# Patient Record
Sex: Female | Born: 1985 | Race: Black or African American | Hispanic: No | Marital: Single | Smoking: Never smoker
Health system: Southern US, Community
[De-identification: ages and names within clinical notes are randomized; demographics above are authoritative.]

## PROBLEM LIST (undated history)

## (undated) ENCOUNTER — Emergency Department (HOSPITAL_COMMUNITY): Admission: EM | Discharge status: Absent without leave | Payer: No Typology Code available for payment source

## (undated) ENCOUNTER — Emergency Department (HOSPITAL_COMMUNITY): Admission: EM | Payer: BC Managed Care – PPO | Source: Home / Self Care

## (undated) ENCOUNTER — Inpatient Hospital Stay (HOSPITAL_COMMUNITY): Payer: Self-pay

## (undated) ENCOUNTER — Inpatient Hospital Stay (HOSPITAL_COMMUNITY): Payer: Medicaid Other | Admitting: Obstetrics and Gynecology

## (undated) DIAGNOSIS — B999 Unspecified infectious disease: Secondary | ICD-10-CM

## (undated) DIAGNOSIS — Z789 Other specified health status: Secondary | ICD-10-CM

## (undated) DIAGNOSIS — R51 Headache: Secondary | ICD-10-CM

## (undated) HISTORY — PX: NO PAST SURGERIES: SHX2092

## (undated) HISTORY — DX: Other specified health status: Z78.9

---
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
## 2004-07-09 ENCOUNTER — Emergency Department (HOSPITAL_COMMUNITY): Admission: EM | Admit: 2004-07-09 | Discharge: 2004-07-09 | Payer: Self-pay | Admitting: Emergency Medicine

## 2004-12-16 ENCOUNTER — Emergency Department (HOSPITAL_COMMUNITY): Admission: EM | Admit: 2004-12-16 | Discharge: 2004-12-16 | Payer: Self-pay | Admitting: *Deleted

## 2005-12-18 ENCOUNTER — Emergency Department (HOSPITAL_COMMUNITY): Admission: EM | Admit: 2005-12-18 | Discharge: 2005-12-19 | Payer: Self-pay | Admitting: Emergency Medicine

## 2008-08-12 ENCOUNTER — Emergency Department (HOSPITAL_COMMUNITY): Admission: EM | Admit: 2008-08-12 | Discharge: 2008-08-12 | Payer: Self-pay | Admitting: Emergency Medicine

## 2008-08-16 ENCOUNTER — Encounter: Admission: RE | Admit: 2008-08-16 | Discharge: 2008-08-16 | Payer: Self-pay | Admitting: Internal Medicine

## 2008-09-23 ENCOUNTER — Emergency Department (HOSPITAL_COMMUNITY): Admission: EM | Admit: 2008-09-23 | Discharge: 2008-09-23 | Payer: Self-pay | Admitting: Emergency Medicine

## 2008-10-26 ENCOUNTER — Emergency Department (HOSPITAL_COMMUNITY): Admission: EM | Admit: 2008-10-26 | Discharge: 2008-10-26 | Payer: Self-pay | Admitting: Emergency Medicine

## 2009-09-19 ENCOUNTER — Emergency Department (HOSPITAL_COMMUNITY): Admission: EM | Admit: 2009-09-19 | Discharge: 2009-09-19 | Payer: Self-pay | Admitting: Emergency Medicine

## 2010-05-14 ENCOUNTER — Emergency Department (HOSPITAL_COMMUNITY)
Admission: EM | Admit: 2010-05-14 | Discharge: 2010-05-14 | Disposition: A | Discharge status: Home / Self Care | Payer: No Typology Code available for payment source | Attending: Emergency Medicine | Admitting: Emergency Medicine

## 2010-05-14 ENCOUNTER — Emergency Department (HOSPITAL_COMMUNITY): Payer: No Typology Code available for payment source

## 2010-05-14 DIAGNOSIS — M542 Cervicalgia: Secondary | ICD-10-CM | POA: Insufficient documentation

## 2010-05-14 DIAGNOSIS — T1490XA Injury, unspecified, initial encounter: Secondary | ICD-10-CM | POA: Insufficient documentation

## 2010-05-14 DIAGNOSIS — Y92410 Unspecified street and highway as the place of occurrence of the external cause: Secondary | ICD-10-CM | POA: Insufficient documentation

## 2010-05-17 ENCOUNTER — Emergency Department (HOSPITAL_COMMUNITY)
Admission: EM | Admit: 2010-05-17 | Discharge: 2010-05-18 | Disposition: A | Discharge status: Home / Self Care | Payer: No Typology Code available for payment source | Attending: Emergency Medicine | Admitting: Emergency Medicine

## 2010-05-17 DIAGNOSIS — M5380 Other specified dorsopathies, site unspecified: Secondary | ICD-10-CM | POA: Insufficient documentation

## 2010-05-17 DIAGNOSIS — M542 Cervicalgia: Secondary | ICD-10-CM | POA: Insufficient documentation

## 2010-05-30 LAB — GC/CHLAMYDIA PROBE AMP, GENITAL
Chlamydia, DNA Probe: NEGATIVE
GC Probe Amp, Genital: NEGATIVE

## 2010-05-30 LAB — URINALYSIS, ROUTINE W REFLEX MICROSCOPIC
Bilirubin Urine: NEGATIVE
Hgb urine dipstick: NEGATIVE
Protein, ur: NEGATIVE mg/dL
Urobilinogen, UA: 1 mg/dL (ref 0.0–1.0)

## 2010-05-30 LAB — URINE CULTURE

## 2010-05-30 LAB — POCT PREGNANCY, URINE: Preg Test, Ur: NEGATIVE

## 2010-05-30 LAB — WET PREP, GENITAL: Yeast Wet Prep HPF POC: NONE SEEN

## 2010-05-31 LAB — URINALYSIS, ROUTINE W REFLEX MICROSCOPIC
Bilirubin Urine: NEGATIVE
Hgb urine dipstick: NEGATIVE
Ketones, ur: NEGATIVE mg/dL
Nitrite: NEGATIVE
pH: 7 (ref 5.0–8.0)

## 2010-05-31 LAB — URINE CULTURE

## 2010-05-31 LAB — URINE MICROSCOPIC-ADD ON

## 2010-05-31 LAB — CBC
MCHC: 33.2 g/dL (ref 30.0–36.0)
MCV: 79.9 fL (ref 78.0–100.0)
Platelets: 263 10*3/uL (ref 150–400)

## 2010-05-31 LAB — DIFFERENTIAL
Basophils Absolute: 0.1 10*3/uL (ref 0.0–0.1)
Eosinophils Relative: 1 % (ref 0–5)
Lymphs Abs: 2 10*3/uL (ref 0.7–4.0)
Monocytes Absolute: 0.4 10*3/uL (ref 0.1–1.0)
Neutro Abs: 3.9 10*3/uL (ref 1.7–7.7)

## 2010-05-31 LAB — POCT PREGNANCY, URINE: Preg Test, Ur: NEGATIVE

## 2010-05-31 LAB — BASIC METABOLIC PANEL
BUN: 6 mg/dL (ref 6–23)
CO2: 26 mEq/L (ref 19–32)
Chloride: 110 mEq/L (ref 96–112)
Creatinine, Ser: 0.65 mg/dL (ref 0.4–1.2)

## 2011-02-22 ENCOUNTER — Emergency Department (HOSPITAL_COMMUNITY)
Admission: EM | Admit: 2011-02-22 | Discharge: 2011-02-22 | Disposition: A | Discharge status: Home / Self Care | Payer: Self-pay | Attending: Emergency Medicine | Admitting: Emergency Medicine

## 2011-02-22 DIAGNOSIS — N760 Acute vaginitis: Secondary | ICD-10-CM | POA: Insufficient documentation

## 2011-02-22 DIAGNOSIS — A499 Bacterial infection, unspecified: Secondary | ICD-10-CM | POA: Insufficient documentation

## 2011-02-22 DIAGNOSIS — R109 Unspecified abdominal pain: Secondary | ICD-10-CM | POA: Insufficient documentation

## 2011-02-22 DIAGNOSIS — N390 Urinary tract infection, site not specified: Secondary | ICD-10-CM | POA: Insufficient documentation

## 2011-02-22 DIAGNOSIS — B9689 Other specified bacterial agents as the cause of diseases classified elsewhere: Secondary | ICD-10-CM | POA: Insufficient documentation

## 2011-02-22 DIAGNOSIS — R350 Frequency of micturition: Secondary | ICD-10-CM | POA: Insufficient documentation

## 2011-02-22 DIAGNOSIS — N949 Unspecified condition associated with female genital organs and menstrual cycle: Secondary | ICD-10-CM | POA: Insufficient documentation

## 2011-02-22 LAB — URINALYSIS, ROUTINE W REFLEX MICROSCOPIC
Bilirubin Urine: NEGATIVE
Glucose, UA: NEGATIVE mg/dL
Ketones, ur: NEGATIVE mg/dL
Nitrite: POSITIVE — AB
Specific Gravity, Urine: 1.025 (ref 1.005–1.030)
pH: 6 (ref 5.0–8.0)

## 2011-02-22 LAB — WET PREP, GENITAL
Trich, Wet Prep: NONE SEEN
Yeast Wet Prep HPF POC: NONE SEEN

## 2011-02-22 MED ORDER — CEPHALEXIN 250 MG/5ML PO SUSR: 250.0000 mg | Freq: Four times a day (QID) | ORAL | Status: AC

## 2011-02-22 MED ORDER — METRONIDAZOLE 500 MG PO TABS: 500.0000 mg | ORAL_TABLET | Freq: Two times a day (BID) | ORAL | Status: AC

## 2011-02-22 MED ORDER — CEPHALEXIN 250 MG PO CAPS
500.0000 mg | ORAL_CAPSULE | Freq: Once | ORAL | Status: AC
  Administered 2011-02-22: 500 mg via ORAL
  Filled 2011-02-22: qty 2

## 2011-02-22 NOTE — ED Notes (Signed)
[place], PA at bedside 

## 2011-02-22 NOTE — ED Provider Notes (Signed)
Medical screening examination/treatment/procedure(s) were performed by non-physician practitioner and as supervising physician I was immediately available for consultation/collaboration.  Raeford Razor, MD 02/22/11 [phone]

## 2011-02-22 NOTE — ED Notes (Signed)
Patient currently resting quietly in bed; no respiratory or acute distress noted.  Patient updated on plan of care; informed patient that we are currently waiting for the PA to come and talk about test results.  Patient has no other questions or concerns at this time; will continue to monitor.

## 2011-02-22 NOTE — ED Notes (Signed)
Received bedside report from [place], [place].  Patient currently sitting up in bed; no respiratory or acute distress noted.  Patient updated on plan of care; informed patient that she will be moved to a private room for a pelvic exam.  Patient has no other questions or concerns at this time.  Updated patient on plan of care; will continue to monitor.

## 2011-02-22 NOTE — ED Notes (Signed)
Pt reports that for approximately a month she has had urine with foul odor and pelvic pain.  Pt reports that she went to health dept and they said she was fine.  Pt states "I thought I just needed to drink more water, but it hasn't helped".

## 2011-02-22 NOTE — ED Notes (Signed)
Patient given copy of discharge paperwork; went over discharge instructions with patient.  Instructed patient to take antibiotics as prescribed and to finish entire prescription of antibiotics.  Instructed patient to follow up with [facility] as needed for worsening of symptoms.  Patient instructed to return to the ED for new, worsening, or concerning symptoms.

## 2011-02-22 NOTE — ED Notes (Signed)
Pt currently resting in bed. No respiratory or cardiac distress. Pt complaining of pelvic pain. Will continue to monitor.

## 2011-02-22 NOTE — ED Provider Notes (Signed)
History    this is a generally healthy [id number] 25 year old female presenting to the ED with chief complaints of odor in urine, and pelvic discomfort. The patient states for the past 2-3 months she has been having abnormal smells of urine and abnormal color to the urine.  She has been seen and evaluated at a healthcare clinic 2 months ago with no specific findings. Patient states her symptom has not improved. She complains of low abdominal pain while lying down.  Pain is intermittent. She has increased urinary frequency, without burning, pain. She has noticed intermittent bouts of white vaginal discharge.  She denies pain with sexual intercourse. She denies fever, rash. Her last menstruation was 2 days ago. She denies prior history of STD. She has a 1 sexual partner and uses protection. She denies a significant history of douching  CSN: [id number]  Arrival date & time 02/22/11  1525   First MD Initiated Contact with Patient 02/22/11 1820      Chief Complaint  Patient presents with  . Pelvic Pain    (Consider location/radiation/quality/duration/timing/severity/associated sxs/prior treatment) HPI  History reviewed. No pertinent past medical history.  History reviewed. No pertinent past surgical history.  No family history on file.  History  Substance Use Topics  . Smoking status: Never Smoker   . Smokeless tobacco: Not on file  . Alcohol Use: No    OB History    Grav Para Term Preterm Abortions TAB SAB Ect Mult Living                  Review of Systems  All other systems reviewed and are negative.    Allergies  Review of patient's allergies indicates no known allergies.  Home Medications  No current outpatient prescriptions on file.  BP 114/68  Pulse 84  Temp(Src) 98 F (36.7 C) (Oral)  Resp 20  Ht 4\' 11"  (1.499 m)  Wt 112 lb (50.803 kg)  BMI 22.62 kg/m2  SpO2 100%  LMP 02/19/2011  Physical Exam  Nursing note and vitals reviewed. Constitutional: She appears  well-developed and well-nourished. No distress.  HENT:  Head: Normocephalic and atraumatic.  Eyes: Conjunctivae are normal.  Neck: Normal range of motion. Neck supple.  Cardiovascular: Normal rate and regular rhythm.   Pulmonary/Chest: Effort normal and breath sounds normal. She exhibits no tenderness.  Abdominal: Soft. There is no tenderness.  Genitourinary: Vagina normal and uterus normal. There is no rash or lesion on the right labia. There is no rash or lesion on the left labia. Cervix exhibits no motion tenderness and no discharge. Right adnexum displays no mass and no tenderness. Left adnexum displays no mass and no tenderness. No erythema, tenderness or bleeding around the vagina. No vaginal discharge found.  Lymphadenopathy:       Right: No inguinal adenopathy present.       Left: No inguinal adenopathy present.    ED Course  Procedures (including critical care time)  Labs Reviewed - No data to display No results found.   No diagnosis found.  Results for orders placed during the hospital encounter of 02/22/11  URINALYSIS, ROUTINE W REFLEX MICROSCOPIC      Component Value Range   Color, Urine YELLOW  YELLOW    APPearance CLOUDY (*) CLEAR    Specific Gravity, Urine 1.025  1.005 - 1.030    pH 6.0  5.0 - 8.0    Glucose, UA NEGATIVE  NEGATIVE (mg/dL)   Hgb urine dipstick SMALL (*) NEGATIVE    Bilirubin  Urine NEGATIVE  NEGATIVE    Ketones, ur NEGATIVE  NEGATIVE (mg/dL)   Protein, ur NEGATIVE  NEGATIVE (mg/dL)   Urobilinogen, UA 1.0  0.0 - 1.0 (mg/dL)   Nitrite POSITIVE (*) NEGATIVE    Leukocytes, UA TRACE (*) NEGATIVE   PREGNANCY, URINE      Component Value Range   Preg Test, Ur NEGATIVE    WET PREP, GENITAL      Component Value Range   Yeast, Wet Prep NONE SEEN  NONE SEEN    Trich, Wet Prep NONE SEEN  NONE SEEN    Clue Cells, Wet Prep MODERATE (*) NONE SEEN    WBC, Wet Prep HPF POC FEW (*) NONE SEEN   URINE MICROSCOPIC-ADD ON      Component Value Range   Squamous  Epithelial / LPF MANY (*) RARE    WBC, UA 3-6  <3 (WBC/hpf)   RBC / HPF 0-2  <3 (RBC/hpf)   Bacteria, UA MANY (*) RARE    Urine-Other MUCOUS PRESENT     No results found.    MDM  Patient presents with symptoms consistent with urinary tract infection. That examination is unremarkable. I doubt PID or cervicitis. Her urine shows evidence of urinary tract infection.  The wet prep shows evidence of bacterial vaginosis. I would treat her with Keflex and Flagyl. Cultures were sent.  Pt is in no acute distress, vital sign stable.          Fayrene Helper, [place] 02/22/11 2041

## 2011-02-23 LAB — GC/CHLAMYDIA PROBE AMP, GENITAL
Chlamydia, DNA Probe: NEGATIVE
GC Probe Amp, Genital: NEGATIVE

## 2011-02-24 LAB — URINE CULTURE
Colony Count: >=100000
Culture  Setup Time: 201212310138

## 2011-02-25 NOTE — ED Notes (Signed)
+   Urine Patient treated with keflex-sensitive to same-chart appended per protocol MD. 

## 2011-03-07 ENCOUNTER — Encounter (HOSPITAL_COMMUNITY): Payer: Self-pay

## 2011-03-07 ENCOUNTER — Emergency Department (HOSPITAL_COMMUNITY): Payer: Self-pay

## 2011-03-07 ENCOUNTER — Emergency Department (HOSPITAL_COMMUNITY)
Admission: EM | Admit: 2011-03-07 | Discharge: 2011-03-07 | Disposition: A | Discharge status: Home / Self Care | Payer: Self-pay | Attending: Emergency Medicine | Admitting: Emergency Medicine

## 2011-03-07 DIAGNOSIS — S6992XA Unspecified injury of left wrist, hand and finger(s), initial encounter: Secondary | ICD-10-CM

## 2011-03-07 DIAGNOSIS — W230XXA Caught, crushed, jammed, or pinched between moving objects, initial encounter: Secondary | ICD-10-CM | POA: Insufficient documentation

## 2011-03-07 DIAGNOSIS — S6010XA Contusion of unspecified finger with damage to nail, initial encounter: Secondary | ICD-10-CM

## 2011-03-07 DIAGNOSIS — S6000XA Contusion of unspecified finger without damage to nail, initial encounter: Secondary | ICD-10-CM | POA: Insufficient documentation

## 2011-03-07 DIAGNOSIS — S6980XA Other specified injuries of unspecified wrist, hand and finger(s), initial encounter: Secondary | ICD-10-CM | POA: Insufficient documentation

## 2011-03-07 DIAGNOSIS — M79609 Pain in unspecified limb: Secondary | ICD-10-CM | POA: Insufficient documentation

## 2011-03-07 DIAGNOSIS — M7989 Other specified soft tissue disorders: Secondary | ICD-10-CM | POA: Insufficient documentation

## 2011-03-07 DIAGNOSIS — S6990XA Unspecified injury of unspecified wrist, hand and finger(s), initial encounter: Secondary | ICD-10-CM | POA: Insufficient documentation

## 2011-03-07 MED ORDER — TRAMADOL HCL 50 MG PO TABS: 50.0000 mg | ORAL_TABLET | Freq: Four times a day (QID) | ORAL | Status: DC | PRN

## 2011-03-07 MED ORDER — TRAMADOL HCL 50 MG PO TABS: 50.0000 mg | ORAL_TABLET | Freq: Four times a day (QID) | ORAL | Status: AC | PRN

## 2011-03-07 NOTE — ED Notes (Signed)
Pt states slammed left thumb in car door last night pt states pain has worsened presents with swelling and bruising to the area states pain radiates to the hand

## 2011-03-07 NOTE — ED Provider Notes (Signed)
History     CSN: [id number]  Arrival date & time 03/07/11  1801   First MD Initiated Contact with Patient 03/07/11 1915      Chief Complaint  Patient presents with  . Hand Injury    left thumb injury    (Consider location/radiation/quality/duration/timing/severity/associated sxs/prior treatment) The history is provided by the patient.  RHD. Left thumb pain with radiation to hand/wrist after closing thumb in car door last night. Pain throbbing, severe, constant, unchanged. There is assoc swelling and bruising to nailbed. No assoc abrasion, laceration, numbness, weakness. No prior tx.  History reviewed. No pertinent past medical history.  History reviewed. No pertinent past surgical history.  No family history on file.  History  Substance Use Topics  . Smoking status: Never Smoker   . Smokeless tobacco: Not on file  . Alcohol Use: No     Review of Systems  Constitutional: Negative for fever and chills.  Musculoskeletal: Positive for joint swelling.       Positive left thumb pain  Skin: Negative for wound.       Left thumb nailbed color change  Neurological: Negative for weakness and numbness.    Allergies  Review of patient's allergies indicates no known allergies.  Home Medications  No current outpatient prescriptions on file.  BP 113/63  Pulse 96  Temp(Src) 98.6 F (37 C) (Oral)  Resp 18  SpO2 100%  LMP 02/19/2011  Physical Exam  Nursing note and vitals reviewed. Constitutional: She is oriented to person, place, and time. She appears well-developed and well-nourished. No distress.  HENT:  Head: Normocephalic and atraumatic.  Right Ear: External ear normal.  Left Ear: External ear normal.  Eyes: Pupils are equal, round, and reactive to light.  Neck: Normal range of motion. Neck supple.  Cardiovascular: Normal rate and regular rhythm.   Pulmonary/Chest: Effort normal. No respiratory distress.  Abdominal: Soft. She exhibits no distension. There is no  tenderness.  Musculoskeletal:       Left hand: She exhibits decreased range of motion and tenderness. She exhibits normal two-point discrimination, normal capillary refill, no deformity and no laceration. normal sensation noted. She exhibits no thumb/finger opposition.       Hands: Neurological: She is alert and oriented to person, place, and time.       Sensation intact to light touch. Intact 2-point discrimination.  Skin: Skin is warm and dry.       Subungual hematoma to proximal left thumb nailbed. Good distal capillary refill. No abrasion or laceration    ED Course  INCISION AND DRAINAGE Date/Time: 03/07/2011 8:20 PM Performed by: Lorenz Coaster JUSTICE Authorized by: Lorenz Coaster JUSTICE Consent: Verbal consent obtained. Risks and benefits: risks, benefits and alternatives were discussed Consent given by: patient Patient understanding: patient states understanding of the procedure being performed Patient identity confirmed: verbally with patient Time out: Immediately prior to procedure a "time out" was called to verify the correct patient, procedure, equipment, support staff and site/side marked as required. Type: subungual hematoma Location: left thumb. Anesthesia method: none. Patient sedated: no Patient tolerance: Patient tolerated the procedure well with no immediate complications. Comments: Bovie pen used to create incision in nailbed with slow oozing return of dark blood, immediate relief of pressure expressed by patient   (including critical care time)  Labs Reviewed - No data to display Dg Finger Thumb Left  03/07/2011  *RADIOLOGY REPORT*  Clinical Data: 26 year old female with blunt trauma.  LEFT THUMB 2+V  Comparison: Left hand series 08/16/2008.  Findings: Bone mineralization is within normal limits.  Osseous structures in the left first ray appear intact.  Joint spaces are preserved.  No acute fracture identified.  IMPRESSION: No acute fracture or dislocation  identified about the left thumb.  Original Report Authenticated By: Harley Hallmark, M.D.     Dx 1: Left thumb injury Dx 2: Subungual hematoma   MDM  Thumb trauma. X-ray with no evidence of fracture or dislocation. Improved pain after subungual hematoma drainage, increased thumb IP flex/ext after drainage with distal n/v intact.    Medical screening examination/treatment/procedure(s) were performed by non-physician practitioner and as supervising physician I was immediately available for consultation/collaboration. Osvaldo Human, M.D.      Elwyn Reach [place], [place] 03/07/11 2051  Carleene Cooper III, MD 03/08/11 [phone]

## 2012-05-17 ENCOUNTER — Emergency Department (HOSPITAL_COMMUNITY)
Admission: EM | Admit: 2012-05-17 | Discharge: 2012-05-18 | Disposition: A | Discharge status: Home / Self Care | Payer: Self-pay | Attending: Emergency Medicine | Admitting: Emergency Medicine

## 2012-05-17 DIAGNOSIS — Z3490 Encounter for supervision of normal pregnancy, unspecified, unspecified trimester: Secondary | ICD-10-CM

## 2012-05-17 DIAGNOSIS — O9989 Other specified diseases and conditions complicating pregnancy, childbirth and the puerperium: Secondary | ICD-10-CM | POA: Insufficient documentation

## 2012-05-17 DIAGNOSIS — R110 Nausea: Secondary | ICD-10-CM | POA: Insufficient documentation

## 2012-05-17 DIAGNOSIS — R109 Unspecified abdominal pain: Secondary | ICD-10-CM | POA: Insufficient documentation

## 2012-05-18 ENCOUNTER — Encounter (HOSPITAL_COMMUNITY): Payer: Self-pay | Admitting: *Deleted

## 2012-05-18 ENCOUNTER — Emergency Department (HOSPITAL_COMMUNITY): Payer: Self-pay

## 2012-05-18 LAB — WET PREP, GENITAL
Trich, Wet Prep: NONE SEEN
Yeast Wet Prep HPF POC: NONE SEEN

## 2012-05-18 LAB — CBC WITH DIFFERENTIAL/PLATELET
Basophils Relative: 0 % (ref 0–1)
Hemoglobin: 11.1 g/dL — ABNORMAL LOW (ref 12.0–15.0)
Lymphocytes Relative: 24 % (ref 12–46)
Lymphs Abs: 2.1 10*3/uL (ref 0.7–4.0)
MCHC: 33.1 g/dL (ref 30.0–36.0)
Monocytes Relative: 5 % (ref 3–12)
Neutro Abs: 5.9 10*3/uL (ref 1.7–7.7)
Neutrophils Relative %: 70 % (ref 43–77)
RBC: 4.33 MIL/uL (ref 3.87–5.11)
WBC: 8.5 10*3/uL (ref 4.0–10.5)

## 2012-05-18 LAB — COMPREHENSIVE METABOLIC PANEL
Albumin: 3.3 g/dL — ABNORMAL LOW (ref 3.5–5.2)
Alkaline Phosphatase: 39 U/L (ref 39–117)
BUN: 5 mg/dL — ABNORMAL LOW (ref 6–23)
CO2: 20 mEq/L (ref 19–32)
Chloride: 102 mEq/L (ref 96–112)
Potassium: 3.4 mEq/L — ABNORMAL LOW (ref 3.5–5.1)
Total Bilirubin: 0.1 mg/dL — ABNORMAL LOW (ref 0.3–1.2)

## 2012-05-18 LAB — URINALYSIS, ROUTINE W REFLEX MICROSCOPIC
Bilirubin Urine: NEGATIVE
Glucose, UA: NEGATIVE mg/dL
Ketones, ur: NEGATIVE mg/dL
Nitrite: NEGATIVE
pH: 5.5 (ref 5.0–8.0)

## 2012-05-18 LAB — LIPASE, BLOOD: Lipase: 29 U/L (ref 11–59)

## 2012-05-18 LAB — GC/CHLAMYDIA PROBE AMP: CT Probe RNA: NEGATIVE

## 2012-05-18 MED ORDER — PRENATAL COMPLETE 14-0.4 MG PO TABS: 1.0000 | ORAL_TABLET | Freq: Every day | ORAL | Status: DC

## 2012-05-18 MED ORDER — GI COCKTAIL ~~LOC~~
30.0000 mL | Freq: Once | ORAL | Status: AC
  Administered 2012-05-18: 30 mL via ORAL
  Filled 2012-05-18: qty 30

## 2012-05-18 MED ORDER — ONDANSETRON 4 MG PO TBDP
4.0000 mg | ORAL_TABLET | Freq: Once | ORAL | Status: AC
  Administered 2012-05-18: 4 mg via ORAL
  Filled 2012-05-18: qty 1

## 2012-05-18 NOTE — ED Provider Notes (Signed)
History     CSN: [id number]  Arrival date & time 05/17/12  2350   First MD Initiated Contact with Patient 05/18/12 0007      Chief Complaint  Patient presents with  . Abdominal Pain    (Consider location/radiation/quality/duration/timing/severity/associated sxs/prior treatment) HPI Comments: Patient presents with intermittent upper abdominal pain and burning for the past month. Is associated with nausea but no vomiting. Patient states her symptoms are worse tonight. Food sometimes make it better and Sometimes makes it worse. Denies any alcohol or excessive NSAID use. No history of ulcers. She states no change in her bowel habits and normally goes every other day. No dysuria or hematuria. No vaginal bleeding or discharge. She does not know when her last menstrual period was. Denies any back pain, chest pain, shortness of breath, headache or fever.  The history is provided by the patient.    History reviewed. No pertinent past medical history.  History reviewed. No pertinent past surgical history.  History reviewed. No pertinent family history.  History  Substance Use Topics  . Smoking status: Never Smoker   . Smokeless tobacco: Not on file  . Alcohol Use: No    OB History   Grav Para Term Preterm Abortions TAB SAB Ect Mult Living                  Review of Systems  Constitutional: Negative for activity change and appetite change.  HENT: Negative for congestion and rhinorrhea.   Respiratory: Negative for cough, chest tightness and shortness of breath.   Cardiovascular: Negative for chest pain.  Gastrointestinal: Positive for nausea and abdominal pain. Negative for vomiting and constipation.  Genitourinary: Negative for dysuria, vaginal bleeding and vaginal discharge.  Musculoskeletal: Negative for back pain.  Skin: Negative for rash.  Neurological: Negative for dizziness, weakness and headaches.  A complete 10 system review of systems was obtained and all systems are  negative except as noted in the HPI and PMH.    Allergies  Review of patient's allergies indicates no known allergies.  Home Medications   Current Outpatient Rx  Name  Route  Sig  Dispense  Refill  . Prenatal Vit-Fe Fumarate-FA (PRENATAL COMPLETE) 14-0.4 MG TABS   Oral   Take 1 tablet by mouth daily.   60 each   0     BP 117/72  Pulse 91  Temp(Src) 98.5 F (36.9 C) (Oral)  Resp 19  SpO2 100%  Physical Exam  Constitutional: She is oriented to person, place, and time. She appears well-developed and well-nourished. No distress.  HENT:  Head: Normocephalic and atraumatic.  Mouth/Throat: Oropharynx is clear and moist.  Eyes: Conjunctivae and EOM are normal. Pupils are equal, round, and reactive to light.  Neck: Normal range of motion. Neck supple.  Cardiovascular: Normal rate, regular rhythm and normal heart sounds.   No murmur heard. Pulmonary/Chest: Effort normal and breath sounds normal. No respiratory distress.  Abdominal: Soft. She exhibits distension. There is tenderness. There is no rebound and no guarding.  Mild RUQ and epigastric tenderness, no guarding or rebound  Genitourinary: Cervix exhibits no motion tenderness. Right adnexum displays no mass and no tenderness. Left adnexum displays no mass and no tenderness. Vaginal discharge found.  White discharge in vaginal vault  Musculoskeletal: Normal range of motion. She exhibits no edema and no tenderness.  No CVAT  Neurological: She is alert and oriented to person, place, and time. No cranial nerve deficit. She exhibits normal muscle tone. Coordination normal.  Skin:  Skin is warm.    ED Course  Procedures (including critical care time)  Labs Reviewed  WET PREP, GENITAL - Abnormal; Notable for the following:    WBC, Wet Prep HPF POC MANY (*)    All other components within normal limits  CBC WITH DIFFERENTIAL - Abnormal; Notable for the following:    Hemoglobin 11.1 (*)    HCT 33.5 (*)    MCV 77.4 (*)    MCH  25.6 (*)    All other components within normal limits  COMPREHENSIVE METABOLIC PANEL - Abnormal; Notable for the following:    Sodium 133 (*)    Potassium 3.4 (*)    BUN 5 (*)    Albumin 3.3 (*)    Total Bilirubin 0.1 (*)    All other components within normal limits  URINALYSIS, ROUTINE W REFLEX MICROSCOPIC - Abnormal; Notable for the following:    APPearance CLOUDY (*)    All other components within normal limits  PREGNANCY, URINE - Abnormal; Notable for the following:    Preg Test, Ur POSITIVE (*)    All other components within normal limits  HCG, QUANTITATIVE, PREGNANCY - Abnormal; Notable for the following:    hCG, Beta Chain, Quant, S 16109 (*)    All other components within normal limits  GC/CHLAMYDIA PROBE AMP  LIPASE, BLOOD   US Abdomen Complete  05/18/2012  *RADIOLOGY REPORT*  Clinical Data:  Abdominal pain.  COMPLETE ABDOMINAL ULTRASOUND  Comparison:  None.  Findings:  Gallbladder:  No gallstones, gallbladder wall thickening, or pericholecystic fluid.  Common bile duct:  4 mm, normal.  Liver:  No focal lesion identified.  Within normal limits in parenchymal echogenicity.  IVC:  Appears normal.  Pancreas:  Not visualized due to overlying bowel gas.  Spleen:  83 mm.  Normal echotexture.  Right Kidney:  11.5 cm. Normal echotexture.  Normal central sinus echo complex.  No calculi or hydronephrosis.  Left Kidney:  10.5 cm. Normal echotexture.  Normal central sinus echo complex.  No calculi or hydronephrosis.  Abdominal aorta:  1.7 cm.  No aneurysm.  IMPRESSION: Normal abdominal ultrasound.  Gallbladder is not fully distended due to nonfasting state.   Original Report Authenticated By: Andreas Newport, M.D.    US Ob Comp Less 14 Wks  05/18/2012  *RADIOLOGY REPORT*  Clinical Data: Abdominal pain.  OBSTETRIC <14 WK ULTRASOUND  Technique:  Transabdominal ultrasound was performed for evaluation of the gestation as well as the maternal uterus and adnexal regions.  Comparison:  05/18/2012  abdominal ultrasound.  Intrauterine gestational sac: Visualized/normal in shape. Yolk sac: Present Embryo: Present Cardiac Activity: Present Heart Rate: 160 bpm  CRL:  48.9 mm  11 w  5 d            [place] EDC: 12/02/2012  Maternal uterus/Adnexae: Physiologic appearance of the adnexa. No free fluid.  IMPRESSION: Single living IUP.  No acute maternal findings.   Original Report Authenticated By: Andreas Newport, M.D.      1. Intrauterine pregnancy       MDM  Intermittent abdominal pain for the past month associated with nausea. Abdomen soft and nontender without peritoneal signs.  Pregnancy test positive. Urinalysis negative. Labs otherwise unremarkable. Normal LFTs and lipase.  Intrauterine pregnancy confirmed at 11 weeks 5 days gestation. Fetal heart rate 160. Pelvic exam benign without vaginal bleeding, cervix closed.  Prenatal vitamins begun. Patient will followup with women's clinic to establish care.  Glynn Octave, MD 05/18/12 [phone]

## 2012-05-18 NOTE — ED Notes (Signed)
Pt states she doesn't remember where her period was , should have already "been came"

## 2012-05-25 ENCOUNTER — Inpatient Hospital Stay (HOSPITAL_COMMUNITY)
Admission: AD | Admit: 2012-05-25 | Discharge: 2012-05-25 | Disposition: A | Discharge status: Home / Self Care | Payer: Medicaid Other | Source: Ambulatory Visit | Attending: Obstetrics and Gynecology | Admitting: Obstetrics and Gynecology

## 2012-05-25 ENCOUNTER — Encounter (HOSPITAL_COMMUNITY): Payer: Self-pay

## 2012-05-25 DIAGNOSIS — R109 Unspecified abdominal pain: Secondary | ICD-10-CM | POA: Insufficient documentation

## 2012-05-25 DIAGNOSIS — K5900 Constipation, unspecified: Secondary | ICD-10-CM | POA: Insufficient documentation

## 2012-05-25 DIAGNOSIS — O99891 Other specified diseases and conditions complicating pregnancy: Secondary | ICD-10-CM | POA: Insufficient documentation

## 2012-05-25 DIAGNOSIS — O99892 Other specified diseases and conditions complicating childbirth: Secondary | ICD-10-CM

## 2012-05-25 DIAGNOSIS — N949 Unspecified condition associated with female genital organs and menstrual cycle: Secondary | ICD-10-CM | POA: Insufficient documentation

## 2012-05-25 HISTORY — DX: Headache: R51

## 2012-05-25 MED ORDER — CALCIUM POLYCARBOPHIL 625 MG PO TABS: 625.0000 mg | ORAL_TABLET | Freq: Every day | ORAL | Status: DC

## 2012-05-25 MED ORDER — POLYETHYLENE GLYCOL 3350 17 G PO PACK: 17.0000 g | PACK | Freq: Every day | ORAL | Status: DC

## 2012-05-25 NOTE — MAU Provider Note (Signed)
History     CSN: [id number]  Arrival date and time: 05/25/12 1731   None     Chief Complaint  Patient presents with  . Abdominal Cramping   HPI This is a 27 y.o. female at [redacted]w[redacted]d who presents with c/o cramping.  Had it yesterday also. Denies bleeding. Denies discharge today. Does admit to some constipation.  RN Note: Pt states was having cramping yesterday and drank a lot of water and pain went away. Pain began again after working today. Cramping is generalized in lower abdomen. Yellowish brown vaginal discharge went away  OB History   Grav Para Term Preterm Abortions TAB SAB Ect Mult Living   1               Past Medical History  Diagnosis Date  . Headache     Past Surgical History  Procedure Laterality Date  . No past surgeries      Family History  Problem Relation Age of Onset  . Cancer Paternal Aunt   . Cancer Paternal Grandmother     History  Substance Use Topics  . Smoking status: Never Smoker   . Smokeless tobacco: Not on file  . Alcohol Use: No    Allergies: No Known Allergies  Prescriptions prior to admission  Medication Sig Dispense Refill  . Prenatal Vit-Fe Fumarate-FA (PRENATAL COMPLETE) 14-0.4 MG TABS Take 1 tablet by mouth daily.  60 each  0    Review of Systems  Constitutional: Negative for fever, chills and malaise/fatigue.  Gastrointestinal: Positive for abdominal pain (pelvic cramping that comes and goes). Negative for nausea, vomiting, diarrhea and constipation.  Genitourinary: Negative for dysuria.   Physical Exam   Blood pressure 110/64, pulse 96, temperature 98.2 F (36.8 C), temperature source Oral, resp. rate 18, height 4\' 11"  (1.499 m), weight 146 lb 4 oz (66.339 kg).  Physical Exam  Constitutional: She is oriented to person, place, and time. She appears well-developed and well-nourished. No distress.  HENT:  Head: Normocephalic.  Cardiovascular: Normal rate.   Respiratory: Effort normal.  GI: Soft. She exhibits no  distension and no mass. There is no tenderness. There is no rebound and no guarding.  Genitourinary: Vagina normal and uterus normal. No vaginal discharge found.  Cervix long and closed Fetal heart tones 160  Musculoskeletal: Normal range of motion.  Neurological: She is alert and oriented to person, place, and time.  Skin: Skin is warm and dry.  Psychiatric: She has a normal mood and affect.    MAU Course  Procedures  MDM US Abdomen Complete  05/18/2012  *RADIOLOGY REPORT*  Clinical Data:  Abdominal pain.  COMPLETE ABDOMINAL ULTRASOUND  Comparison:  None.  Findings:  Gallbladder:  No gallstones, gallbladder wall thickening, or pericholecystic fluid.  Common bile duct:  4 mm, normal.  Liver:  No focal lesion identified.  Within normal limits in parenchymal echogenicity.  IVC:  Appears normal.  Pancreas:  Not visualized due to overlying bowel gas.  Spleen:  83 mm.  Normal echotexture.  Right Kidney:  11.5 cm. Normal echotexture.  Normal central sinus echo complex.  No calculi or hydronephrosis.  Left Kidney:  10.5 cm. Normal echotexture.  Normal central sinus echo complex.  No calculi or hydronephrosis.  Abdominal aorta:  1.7 cm.  No aneurysm.  IMPRESSION: Normal abdominal ultrasound.  Gallbladder is not fully distended due to nonfasting state.   Original Report Authenticated By: Andreas Newport, M.D.    US Ob Comp Less 14 Wks  05/18/2012  *  RADIOLOGY REPORT*  Clinical Data: Abdominal pain.  OBSTETRIC <14 WK ULTRASOUND  Technique:  Transabdominal ultrasound was performed for evaluation of the gestation as well as the maternal uterus and adnexal regions.  Comparison:  05/18/2012 abdominal ultrasound.  Intrauterine gestational sac: Visualized/normal in shape. Yolk sac: Present Embryo: Present Cardiac Activity: Present Heart Rate: 160 bpm  CRL:  48.9 mm  11 w  5 d            [place] EDC: 12/02/2012  Maternal uterus/Adnexae: Physiologic appearance of the adnexa. No free fluid.    IMPRESSION: Single living  IUP.  No acute maternal findings.     Original Report Authenticated By: Andreas Newport, M.D.     Assessment and Plan  A:  SIUP at [redacted]w[redacted]d       Uterine cramping      Constipation       P:  Discussed hydration and keeping bladder empty.         Reviewed good US findings from 3/26       Reviewed remedies for constipation      Scheduled to come to clinic for new ob visit         [facility] 05/25/2012, 6:26 PM

## 2012-05-25 NOTE — MAU Note (Addendum)
Pt states was having cramping yesterday and drank a lot of water and pain went away. Pain began again after working today. Cramping is generalized in lower abdomen. Yellowish brown vaginal discharge went away

## 2012-05-27 NOTE — MAU Provider Note (Signed)
Attestation of Attending Supervision of Advanced Practitioner: Evaluation and management procedures were performed by the PA/NP/CNM/OB Fellow under my supervision/collaboration. Chart reviewed and agree with management and plan.  Callen Zuba V 05/27/2012 9:10 AM

## 2012-05-30 ENCOUNTER — Emergency Department (HOSPITAL_COMMUNITY)
Admission: EM | Admit: 2012-05-30 | Discharge: 2012-05-31 | Disposition: A | Discharge status: Home / Self Care | Payer: Medicaid Other | Attending: Emergency Medicine | Admitting: Emergency Medicine

## 2012-05-30 ENCOUNTER — Encounter (HOSPITAL_COMMUNITY): Payer: Self-pay | Admitting: *Deleted

## 2012-05-30 DIAGNOSIS — B373 Candidiasis of vulva and vagina: Secondary | ICD-10-CM

## 2012-05-30 DIAGNOSIS — Z3490 Encounter for supervision of normal pregnancy, unspecified, unspecified trimester: Secondary | ICD-10-CM

## 2012-05-30 DIAGNOSIS — R109 Unspecified abdominal pain: Secondary | ICD-10-CM | POA: Insufficient documentation

## 2012-05-30 DIAGNOSIS — O2390 Unspecified genitourinary tract infection in pregnancy, unspecified trimester: Secondary | ICD-10-CM | POA: Insufficient documentation

## 2012-05-30 DIAGNOSIS — B3731 Acute candidiasis of vulva and vagina: Secondary | ICD-10-CM | POA: Insufficient documentation

## 2012-05-30 DIAGNOSIS — Z8679 Personal history of other diseases of the circulatory system: Secondary | ICD-10-CM | POA: Insufficient documentation

## 2012-05-30 NOTE — ED Notes (Signed)
Pt states she has a vaginal discharge , denies bleeding,  Admits to no OB/GYN treatment she is approximately [redacted] weeks pregnant doesn't remember her last period.

## 2012-05-31 ENCOUNTER — Encounter (HOSPITAL_COMMUNITY): Payer: Self-pay | Admitting: *Deleted

## 2012-05-31 ENCOUNTER — Inpatient Hospital Stay (HOSPITAL_COMMUNITY)
Admission: AD | Admit: 2012-05-31 | Discharge: 2012-06-01 | Disposition: A | Discharge status: Home / Self Care | Payer: Medicaid Other | Source: Ambulatory Visit | Attending: Obstetrics and Gynecology | Admitting: Obstetrics and Gynecology

## 2012-05-31 DIAGNOSIS — O26899 Other specified pregnancy related conditions, unspecified trimester: Secondary | ICD-10-CM

## 2012-05-31 DIAGNOSIS — O9989 Other specified diseases and conditions complicating pregnancy, childbirth and the puerperium: Secondary | ICD-10-CM

## 2012-05-31 DIAGNOSIS — R109 Unspecified abdominal pain: Secondary | ICD-10-CM | POA: Insufficient documentation

## 2012-05-31 DIAGNOSIS — N949 Unspecified condition associated with female genital organs and menstrual cycle: Secondary | ICD-10-CM

## 2012-05-31 DIAGNOSIS — O99891 Other specified diseases and conditions complicating pregnancy: Secondary | ICD-10-CM | POA: Insufficient documentation

## 2012-05-31 DIAGNOSIS — R102 Pelvic and perineal pain: Secondary | ICD-10-CM

## 2012-05-31 LAB — OB RESULTS CONSOLE GC/CHLAMYDIA: Chlamydia: NEGATIVE

## 2012-05-31 LAB — URINALYSIS, ROUTINE W REFLEX MICROSCOPIC
Bilirubin Urine: NEGATIVE
Hgb urine dipstick: NEGATIVE
Nitrite: NEGATIVE
Protein, ur: NEGATIVE mg/dL
Specific Gravity, Urine: >1.03 — ABNORMAL HIGH (ref 1.005–1.030)
Urobilinogen, UA: 0.2 mg/dL (ref 0.0–1.0)

## 2012-05-31 LAB — URINE MICROSCOPIC-ADD ON

## 2012-05-31 LAB — WET PREP, GENITAL

## 2012-05-31 MED ORDER — CEFTRIAXONE SODIUM 250 MG IJ SOLR
250.0000 mg | Freq: Once | INTRAMUSCULAR | Status: DC
  Filled 2012-05-31: qty 250

## 2012-05-31 MED ORDER — LIDOCAINE HCL 1 % IJ SOLN
INTRAMUSCULAR | Status: AC
  Filled 2012-05-31: qty 20

## 2012-05-31 MED ORDER — ACETAMINOPHEN 160 MG/5ML PO SOLN
650.0000 mg | Freq: Once | ORAL | Status: AC
  Administered 2012-05-31: 650 mg via ORAL
  Filled 2012-05-31: qty 20.3

## 2012-05-31 MED ORDER — ACETAMINOPHEN 500 MG PO TABS: 1000.0000 mg | ORAL_TABLET | Freq: Once | ORAL | Status: DC

## 2012-05-31 MED ORDER — CLOTRIMAZOLE 1 % VA CREA: 1.0000 | TOPICAL_CREAM | Freq: Every day | VAGINAL | Status: AC

## 2012-05-31 MED ORDER — AZITHROMYCIN 250 MG PO TABS
1000.0000 mg | ORAL_TABLET | Freq: Once | ORAL | Status: DC
  Filled 2012-05-31: qty 4

## 2012-05-31 NOTE — MAU Provider Note (Signed)
History     CSN: [id number]  Arrival date and time: 05/31/12 2113   First Provider Initiated Contact with Patient 05/31/12 2250      No chief complaint on file.  HPI  Jackie Brooks is 27 y.o. G1P0000 with abdominal pain. She denies any vaginal bleeding or discharge. She was seen earlier this morning (around 0200) at [facility]. She states that she has been eating and drinking normally.   Past Medical History  Diagnosis Date  . Headache     Past Surgical History  Procedure Laterality Date  . No past surgeries      Family History  Problem Relation Age of Onset  . Cancer Paternal Aunt   . Cancer Paternal Grandmother     History  Substance Use Topics  . Smoking status: Never Smoker   . Smokeless tobacco: Not on file  . Alcohol Use: No    Allergies: No Known Allergies  Prescriptions prior to admission  Medication Sig Dispense Refill  . Prenatal Vit-Fe Fumarate-FA (PRENATAL COMPLETE) 14-0.4 MG TABS Take 1 tablet by mouth daily.  60 each  0  . clotrimazole (GYNE-LOTRIMIN) 1 % vaginal cream Place 1 Applicatorful vaginally at bedtime.  7 g  0    Review of Systems  Constitutional: Negative for fever and chills.  Eyes: Negative for blurred vision.  Respiratory: Negative for shortness of breath.   Cardiovascular: Negative for chest pain.  Gastrointestinal: Negative for nausea, vomiting, abdominal pain, diarrhea and constipation.  Genitourinary: Positive for frequency. Negative for dysuria and urgency.  Musculoskeletal: Negative for myalgias.  Neurological: Negative for dizziness and headaches.   Physical Exam   Blood pressure 109/67, pulse 108, temperature 98.2 F (36.8 C), temperature source Oral, height 4\' 11"  (1.499 m), weight 145 lb (65.772 kg).  Physical Exam  Nursing note and vitals reviewed. Constitutional: She is oriented to person, place, and time. She appears well-developed and well-nourished. No distress.  Cardiovascular: Normal rate.   Respiratory: Effort  normal.  GI: Soft.  Genitourinary:   Uterus: AGA  Neurological: She is alert and oriented to person, place, and time.  Skin: Skin is warm and dry.  Psychiatric: She has a normal mood and affect.    MAU Course  Procedures  Results for orders placed during the hospital encounter of 05/31/12 (from the past 24 hour(s))  URINALYSIS, ROUTINE W REFLEX MICROSCOPIC     Status: Abnormal   Collection Time    05/31/12 10:00 PM      Result Value Range   Color, Urine YELLOW  YELLOW   APPearance CLEAR  CLEAR   Specific Gravity, Urine >1.030 (*) 1.005 - 1.030   pH 5.5  5.0 - 8.0   Glucose, UA NEGATIVE  NEGATIVE mg/dL   Hgb urine dipstick NEGATIVE  NEGATIVE   Bilirubin Urine NEGATIVE  NEGATIVE   Ketones, ur 15 (*) NEGATIVE mg/dL   Protein, ur NEGATIVE  NEGATIVE mg/dL   Urobilinogen, UA 0.2  0.0 - 1.0 mg/dL   Nitrite NEGATIVE  NEGATIVE   Leukocytes, UA MODERATE (*) NEGATIVE  URINE MICROSCOPIC-ADD ON     Status: Abnormal   Collection Time    05/31/12 10:00 PM      Result Value Range   Squamous Epithelial / LPF FEW (*) RARE   WBC, UA 7-10  <3 WBC/hpf   RBC / HPF 3-6  <3 RBC/hpf   Bacteria, UA FEW (*) RARE     Assessment and Plan   1. Pain of round ligament complicating pregnancy, antepartum  Start [facility] as soon as possible 2nd trimester danger signs reviewed   Tawnya Crook 05/31/2012, 10:54 PM

## 2012-05-31 NOTE — ED Provider Notes (Signed)
History     CSN: [id number]  Arrival date & time 05/30/12  2217   First MD Initiated Contact with Patient 05/31/12 0113      Chief Complaint  Patient presents with  . Abdominal Pain  . Vaginal Discharge    (Consider location/radiation/quality/duration/timing/severity/associated sxs/prior treatment) Patient is a 27 y.o. female presenting with abdominal pain and vaginal discharge. The history is provided by the patient.  Abdominal Pain Pain location:  Suprapubic Pain quality: aching   Associated symptoms: vaginal discharge   Associated symptoms: no chills, no dysuria, no fever, no nausea and no vaginal bleeding   Vaginal Discharge Associated symptoms include abdominal pain. Pertinent negatives include no chills, fever, nausea or rash.    Past Medical History  Diagnosis Date  . Headache     Past Surgical History  Procedure Laterality Date  . No past surgeries      Family History  Problem Relation Age of Onset  . Cancer Paternal Aunt   . Cancer Paternal Grandmother     History  Substance Use Topics  . Smoking status: Never Smoker   . Smokeless tobacco: Not on file  . Alcohol Use: No    OB History   Grav Para Term Preterm Abortions TAB SAB Ect Mult Living   1               Review of Systems  Constitutional: Negative for fever and chills.  Gastrointestinal: Positive for abdominal pain. Negative for nausea.  Genitourinary: Positive for vaginal discharge. Negative for dysuria and vaginal bleeding.  Skin: Negative for rash and wound.  All other systems reviewed and are negative.    Allergies  Review of patient's allergies indicates no known allergies.  Home Medications   Current Outpatient Rx  Name  Route  Sig  Dispense  Refill  . Prenatal Vit-Fe Fumarate-FA (PRENATAL COMPLETE) 14-0.4 MG TABS   Oral   Take 1 tablet by mouth daily.   60 each   0   . clotrimazole (GYNE-LOTRIMIN) 1 % vaginal cream   Vaginal   Place 1 Applicatorful vaginally at  bedtime.   7 g   0     Dispense as written.     BP 93/65  Pulse 99  Temp(Src) 98.1 F (36.7 C) (Oral)  Resp 18  SpO2 100%  Physical Exam  Nursing note and vitals reviewed. Constitutional: She appears well-developed and well-nourished.  Eyes: Pupils are equal, round, and reactive to light.  Neck: Normal range of motion.  Cardiovascular: Normal rate.   Pulmonary/Chest: Effort normal.  Abdominal: Soft. She exhibits no distension. There is no tenderness.  Genitourinary: Cervix exhibits no discharge. Vaginal discharge found.  Thick chunky white discharge, consistent with yeast    ED Course  Procedures (including critical care time)  Labs Reviewed  WET PREP, GENITAL - Abnormal; Notable for the following:    Yeast Wet Prep HPF POC MODERATE (*)    WBC, Wet Prep HPF POC MANY (*)    All other components within normal limits  GC/CHLAMYDIA PROBE AMP   No results found.   1. Vaginal candidiasis   2. Pregnancy       MDM   Patient refused erythromycin and Rocephin, stating she would rather wait for her cultures to come back Fetal heart tones located in the suprapubic area       Arman Filter, NP 05/31/12 [phone]

## 2012-05-31 NOTE — ED Notes (Signed)
Fetal heart tones assessed by Vira Browns, RN- update given to Earley Favor, EDNP- okay to d/c

## 2012-05-31 NOTE — ED Notes (Signed)
Pt reports she is pregnant, approx 13 weeks- has not been seen by OBGYN- c/o vaginal burning, itching and d/c- denies bleeding- also intermittent sharp pelvic pain

## 2012-05-31 NOTE — MAU Note (Addendum)
PT SAYS  ITS HARD TO SLEEP  BECAUSE HER ABD  BURNS---  STARTED  ON Friday.     SHE TRIED TUMS.   NOW IN TRIAGE-    FEELS LIKE A TIGHT MUSCLE.   NO PNC-     WAS HERE 1 WEEK AGO-   GAVE RX FOR FIBER.      WENT TO [place]  LAST NIGHT -  DID PELVIC-    DID  NOT  COLLECT URINE

## 2012-05-31 NOTE — ED Notes (Signed)
D/c home with rx x 1 for clotrimazole

## 2012-05-31 NOTE — ED Notes (Signed)
pelivc supplies at bedside.

## 2012-06-01 NOTE — MAU Provider Note (Signed)
Attestation of Attending Supervision of Advanced Practitioner (CNM/NP): Evaluation and management procedures were performed by the Advanced Practitioner under my supervision and collaboration.  I have reviewed the Advanced Practitioner's note and chart, and I agree with the management and plan.  Tyrika Newman 06/01/2012 8:46 AM

## 2012-06-02 ENCOUNTER — Encounter: Payer: Self-pay | Admitting: *Deleted

## 2012-06-02 LAB — URINE CULTURE
Colony Count: NO GROWTH
Culture: NO GROWTH

## 2012-06-06 NOTE — ED Provider Notes (Signed)
Medical screening examination/treatment/procedure(s) were performed by non-physician practitioner and as supervising physician I was immediately available for consultation/collaboration.  Hurman Horn, MD 06/06/12 [phone]

## 2012-06-09 ENCOUNTER — Encounter (HOSPITAL_COMMUNITY): Payer: Self-pay | Admitting: *Deleted

## 2012-06-09 ENCOUNTER — Inpatient Hospital Stay (HOSPITAL_COMMUNITY)
Admission: AD | Admit: 2012-06-09 | Discharge: 2012-06-09 | Disposition: A | Discharge status: Home / Self Care | Payer: Medicaid Other | Source: Ambulatory Visit | Attending: Obstetrics & Gynecology | Admitting: Obstetrics & Gynecology

## 2012-06-09 DIAGNOSIS — O26852 Spotting complicating pregnancy, second trimester: Secondary | ICD-10-CM

## 2012-06-09 DIAGNOSIS — O26859 Spotting complicating pregnancy, unspecified trimester: Secondary | ICD-10-CM | POA: Insufficient documentation

## 2012-06-09 DIAGNOSIS — R102 Pelvic and perineal pain: Secondary | ICD-10-CM

## 2012-06-09 DIAGNOSIS — R109 Unspecified abdominal pain: Secondary | ICD-10-CM | POA: Insufficient documentation

## 2012-06-09 DIAGNOSIS — N949 Unspecified condition associated with female genital organs and menstrual cycle: Secondary | ICD-10-CM

## 2012-06-09 DIAGNOSIS — O9989 Other specified diseases and conditions complicating pregnancy, childbirth and the puerperium: Secondary | ICD-10-CM

## 2012-06-09 LAB — URINALYSIS, ROUTINE W REFLEX MICROSCOPIC
Bilirubin Urine: NEGATIVE
Hgb urine dipstick: NEGATIVE
Ketones, ur: NEGATIVE mg/dL
Nitrite: NEGATIVE
Protein, ur: NEGATIVE mg/dL
Specific Gravity, Urine: >1.03 — ABNORMAL HIGH (ref 1.005–1.030)
Urobilinogen, UA: 0.2 mg/dL (ref 0.0–1.0)

## 2012-06-09 LAB — URINE MICROSCOPIC-ADD ON

## 2012-06-09 MED ORDER — CYCLOBENZAPRINE HCL 10 MG PO TABS: 10.0000 mg | ORAL_TABLET | Freq: Three times a day (TID) | ORAL | Status: DC | PRN

## 2012-06-09 MED ORDER — IBUPROFEN 600 MG PO TABS
600.0000 mg | ORAL_TABLET | Freq: Once | ORAL | Status: AC
  Administered 2012-06-09: 600 mg via ORAL
  Filled 2012-06-09: qty 1

## 2012-06-09 NOTE — MAU Provider Note (Signed)
Chief Complaint: Vaginal Bleeding  First Provider Initiated Contact with Patient 06/09/12 2235    SUBJECTIVE HPI: Jackie Brooks is a 27 y.o. G1P0000 at [redacted]w[redacted]d by LMP who presents with bilat low abdominal cramping off and on x a few weeks. Reported small amount of vaginal bleeding when she wiped yesterday, initially denied to CNM. States she has not any testing in the pregnancy and not started [facility].   ED Note from 05/18/12 shows Pelvic US showing 11.5 weeks viable SIUP. Normal Abd [place]. MAU/ED visits from 05/25/12, 05/30/12 and 05/31/12 for constipation, round ligament pain, yeast infection. GC/CT neg. FHR dopplered at each visit. Showed pt [place]. Verified that pt was correctly identified and results were documented on correct pt. She admitted that she had had the [place], but stated that it was not complete and the the ED MD told her to go to MAU for another [place]. MD's note from has instructions to F/U at [facility] for prenatal care.  Declines Wet Prep, GC/CT.   Past Medical History  Diagnosis Date  . Headache    OB History   Grav Para Term Preterm Abortions TAB SAB Ect Mult Living   1 0 0 0 0 0 0 0 0 0      # Outc Date GA Lbr Len/2nd Wgt Sex Del Anes PTL Lv   1 CUR              Past Surgical History  Procedure Laterality Date  . No past surgeries     History   Social History  . Marital Status: Married    Spouse Name: N/A    Number of Children: N/A  . Years of Education: N/A   Occupational History  . Not on file.   Social History Main Topics  . Smoking status: Never Smoker   . Smokeless tobacco: Not on file  . Alcohol Use: No  . Drug Use: No  . Sexually Active: Yes   Other Topics Concern  . Not on file   Social History Narrative  . No narrative on file   No current facility-administered medications on file prior to encounter.   Current Outpatient Prescriptions on File Prior to Encounter  Medication Sig Dispense Refill  . Prenatal Vit-Fe Fumarate-FA (PRENATAL COMPLETE)  14-0.4 MG TABS Take 1 tablet by mouth daily.  60 each  0   No Known Allergies  ROS: Denies fever, chills, urinary complaints, vaginal discharge, GI complaints.   OBJECTIVE Blood pressure 109/71, pulse 107, temperature 97.9 F (36.6 C), resp. rate 16.  GENERAL: Well-developed, well-nourished female in no acute distress.  HEENT: Normocephalic HEART: Mild tachycardia. RESP: normal effort ABDOMEN: Soft, non-tender. Pos BS. Gravid S=D.   EXTREMITIES: Nontender, no edema NEURO: Alert and oriented SPECULUM EXAM: NEFG, physiologic discharge, no blood noted, cervix clean, non-friable. No lesions.  BIMANUAL: cervix closed; uterus S=D, no adnexal tenderness or masses FHR 155 by doppler  LAB RESULTS Results for orders placed during the hospital encounter of 06/09/12 (from the past 24 hour(s))  URINALYSIS, ROUTINE W REFLEX MICROSCOPIC     Status: Abnormal   Collection Time    06/09/12  8:53 PM      Result Value Range   Color, Urine YELLOW  YELLOW   APPearance CLEAR  CLEAR   Specific Gravity, Urine >1.030 (*) 1.005 - 1.030   pH 5.5  5.0 - 8.0   Glucose, UA NEGATIVE  NEGATIVE mg/dL   Hgb urine dipstick NEGATIVE  NEGATIVE   Bilirubin Urine NEGATIVE  NEGATIVE   Ketones, ur NEGATIVE  NEGATIVE mg/dL   Protein, ur NEGATIVE  NEGATIVE mg/dL   Urobilinogen, UA 0.2  0.0 - 1.0 mg/dL   Nitrite NEGATIVE  NEGATIVE   Leukocytes, UA SMALL (*) NEGATIVE  URINE MICROSCOPIC-ADD ON     Status: Abnormal   Collection Time    06/09/12  8:53 PM      Result Value Range   Squamous Epithelial / LPF MANY (*) RARE   WBC, UA 7-10  <3 WBC/hpf   Bacteria, UA FEW (*) RARE   Urine-Other MUCOUS PRESENT      IMAGING US Abdomen Complete  05/18/2012  *RADIOLOGY REPORT*  Clinical Data:  Abdominal pain.  COMPLETE ABDOMINAL ULTRASOUND  Comparison:  None.  Findings:  Gallbladder:  No gallstones, gallbladder wall thickening, or pericholecystic fluid.  Common bile duct:  4 mm, normal.  Liver:  No focal lesion identified.   Within normal limits in parenchymal echogenicity.  IVC:  Appears normal.  Pancreas:  Not visualized due to overlying bowel gas.  Spleen:  83 mm.  Normal echotexture.  Right Kidney:  11.5 cm. Normal echotexture.  Normal central sinus echo complex.  No calculi or hydronephrosis.  Left Kidney:  10.5 cm. Normal echotexture.  Normal central sinus echo complex.  No calculi or hydronephrosis.  Abdominal aorta:  1.7 cm.  No aneurysm.  IMPRESSION: Normal abdominal ultrasound.  Gallbladder is not fully distended due to nonfasting state.   Original Report Authenticated By: Andreas Newport, M.D.    US Ob Comp Less 14 Wks  05/18/2012  *RADIOLOGY REPORT*  Clinical Data: Abdominal pain.  OBSTETRIC <14 WK ULTRASOUND  Technique:  Transabdominal ultrasound was performed for evaluation of the gestation as well as the maternal uterus and adnexal regions.  Comparison:  05/18/2012 abdominal ultrasound.  Intrauterine gestational sac: Visualized/normal in shape. Yolk sac: Present Embryo: Present Cardiac Activity: Present Heart Rate: 160 bpm  CRL:  48.9 mm  11 w  5 d            [place] EDC: 12/02/2012  Maternal uterus/Adnexae: Physiologic appearance of the adnexa. No free fluid.  IMPRESSION: Single living IUP.  No acute maternal findings.   Original Report Authenticated By: Andreas Newport, M.D.     MAU COURSE Pt requesting [place]. Explained not indicated w/ known IUP, pos FHT's by doppler and no bleeding. Encouraged pt to start [facility]. Anatomy US will be scheduled from 18-20 weeks.  Pt given Ibuprofen 60 mg. Anxious to leave before waiting for it to work. States she has list of prenatal care providers.   ASSESSMENT 1. Pain of round ligament complicating pregnancy, antepartum   2. Questionable spotting complicating pregnancy in second trimester    PLAN Discharge home Pelvic rest. Increase rest and fluids.  Comfort measures.  Follow-up Information   Follow up with Start prenatal care.      Follow up with [facility]  [place] [facility] ADMISS[facility]. (As needed in emergencies)    Contact information:   [address] [place] [address] [phone]       Medication List    TAKE these medications       cyclobenzaprine 10 MG tablet  Commonly known as:  FLEXERIL  Take 1 tablet (10 mg total) by mouth 3 (three) times daily as needed for muscle spasms.     miconazole 2 % vaginal cream  Commonly known as:  MONISTAT 7  Place 1 applicator vaginally at bedtime.     Prenatal Complete 14-0.4 MG  Tabs  Take 1 tablet by mouth daily.       [place], [place] 06/09/2012  9:34 PM

## 2012-06-09 NOTE — MAU Note (Signed)
Pt presents with complaints of vaginal bleeding when she wipes since yesterday with some abdominal cramping. Has not been evaluated by a physician yet.

## 2012-06-11 LAB — URINE CULTURE: Colony Count: >=100000

## 2012-06-11 NOTE — MAU Provider Note (Signed)
Attestation of Attending Supervision of Advanced Practitioner (PA/CNM/NP): Evaluation and management procedures were performed by the Advanced Practitioner under my supervision and collaboration.  I have reviewed the Advanced Practitioner's note and chart, and I agree with the management and plan.  Satonya Lux, MD, FACOG Attending Obstetrician & Gynecologist Faculty Practice, [facility]   

## 2012-06-25 ENCOUNTER — Encounter (HOSPITAL_COMMUNITY): Payer: Self-pay | Admitting: *Deleted

## 2012-06-25 ENCOUNTER — Inpatient Hospital Stay (HOSPITAL_COMMUNITY)
Admission: AD | Admit: 2012-06-25 | Discharge: 2012-06-25 | Disposition: A | Discharge status: Home / Self Care | Payer: Medicaid Other | Source: Ambulatory Visit | Attending: Obstetrics & Gynecology | Admitting: Obstetrics & Gynecology

## 2012-06-25 DIAGNOSIS — R109 Unspecified abdominal pain: Secondary | ICD-10-CM | POA: Insufficient documentation

## 2012-06-25 DIAGNOSIS — O99891 Other specified diseases and conditions complicating pregnancy: Secondary | ICD-10-CM | POA: Insufficient documentation

## 2012-06-25 DIAGNOSIS — R197 Diarrhea, unspecified: Secondary | ICD-10-CM | POA: Insufficient documentation

## 2012-06-25 DIAGNOSIS — R51 Headache: Secondary | ICD-10-CM | POA: Insufficient documentation

## 2012-06-25 DIAGNOSIS — N949 Unspecified condition associated with female genital organs and menstrual cycle: Secondary | ICD-10-CM

## 2012-06-25 DIAGNOSIS — O210 Mild hyperemesis gravidarum: Secondary | ICD-10-CM | POA: Insufficient documentation

## 2012-06-25 LAB — URINALYSIS, ROUTINE W REFLEX MICROSCOPIC
Bilirubin Urine: NEGATIVE
Hgb urine dipstick: NEGATIVE
Ketones, ur: NEGATIVE mg/dL
Nitrite: NEGATIVE
Protein, ur: NEGATIVE mg/dL
Specific Gravity, Urine: 1.025 (ref 1.005–1.030)
Urobilinogen, UA: 0.2 mg/dL (ref 0.0–1.0)

## 2012-06-25 MED ORDER — PROMETHAZINE HCL 12.5 MG PO TABS: 12.5000 mg | ORAL_TABLET | Freq: Four times a day (QID) | ORAL | Status: DC | PRN

## 2012-06-25 MED ORDER — ACETAMINOPHEN 500 MG PO TABS: 1000.0000 mg | ORAL_TABLET | Freq: Once | ORAL | Status: DC

## 2012-06-25 MED ORDER — CYCLOBENZAPRINE HCL 10 MG PO TABS: 10.0000 mg | ORAL_TABLET | Freq: Three times a day (TID) | ORAL | Status: DC | PRN

## 2012-06-25 MED ORDER — OXYCODONE-ACETAMINOPHEN 5-325 MG PO TABS: 1.0000 | ORAL_TABLET | ORAL | Status: DC | PRN

## 2012-06-25 MED ORDER — ABDOMINAL BINDER/ELASTIC SMALL MISC: 1.0000 [IU] | Freq: Every day | Status: DC | PRN

## 2012-06-25 NOTE — MAU Note (Signed)
Note pt sttes she did not want the tylenol that was ordered

## 2012-06-25 NOTE — MAU Provider Note (Signed)
History     CSN: [id number]  Arrival date and time: 06/25/12 1756   None     No chief complaint on file.  HPI Ms. Jackie Brooks is a 27 y.o. G1P0000 at [redacted]w[redacted]d who presents to MAU today with complaint of headache, diarrhea and abdominal pain. The patient states that she has had headache and diarrhea x 3-4 days. Headache pain is rated at 5-6/10 now. She denies any pain medications. She has had some nausea without vomiting and feels that she has decreased appetite. She is drinking a lot of water. She states that she is also having occasional pulling/stabbing pain in her mid-right abdomen. She states that the pain feels like stretching sometimes and is worse with ambulation and movement. She notices it most often when she is at work and having to walk and squat or bend. She denies fever, UTI symptoms, vaginal bleeding, sick contacts or new food exposures. She has some vaginal discharge that is thin and white and similar to previous. The patient has not yet started care. Has an appointment in the clinic later this month.   OB History   Grav Para Term Preterm Abortions TAB SAB Ect Mult Living   1 0 0 0 0 0 0 0 0 0       Past Medical History  Diagnosis Date  . Headache     Past Surgical History  Procedure Laterality Date  . No past surgeries      Family History  Problem Relation Age of Onset  . Cancer Paternal Aunt   . Cancer Paternal Grandmother     History  Substance Use Topics  . Smoking status: Never Smoker   . Smokeless tobacco: Not on file  . Alcohol Use: No    Allergies: No Known Allergies  No prescriptions prior to admission    Review of Systems  Constitutional: Negative for fever and chills.  Gastrointestinal: Positive for nausea, abdominal pain and diarrhea. Negative for vomiting and constipation.  Genitourinary: Negative for dysuria, urgency and frequency.       Neg - vaginal bleeding + discharge   Physical Exam   Blood pressure 110/68, pulse 104, resp. rate  16.  Physical Exam  Constitutional: She is oriented to person, place, and time. She appears well-developed and well-nourished. No distress.  Cardiovascular: Normal rate, regular rhythm and normal heart sounds.   Respiratory: Effort normal and breath sounds normal. No respiratory distress.  GI: Soft. Bowel sounds are normal. She exhibits no distension and no mass. There is tenderness (right mid abdomen). There is no rebound and no guarding.  Neurological: She is alert and oriented to person, place, and time.  Skin: Skin is warm and dry. No erythema.  Psychiatric: She has a normal mood and affect.   Results for orders placed during the hospital encounter of 06/25/12 (from the past 24 hour(s))  URINALYSIS, ROUTINE W REFLEX MICROSCOPIC     Status: None   Collection Time    06/25/12  6:10 PM      Result Value Range   Color, Urine YELLOW  YELLOW   APPearance CLEAR  CLEAR   Specific Gravity, Urine 1.025  1.005 - 1.030   pH 6.0  5.0 - 8.0   Glucose, UA NEGATIVE  NEGATIVE mg/dL   Hgb urine dipstick NEGATIVE  NEGATIVE   Bilirubin Urine NEGATIVE  NEGATIVE   Ketones, ur NEGATIVE  NEGATIVE mg/dL   Protein, ur NEGATIVE  NEGATIVE mg/dL   Urobilinogen, UA 0.2  0.0 - 1.0 mg/dL  Nitrite NEGATIVE  NEGATIVE   Leukocytes, UA NEGATIVE  NEGATIVE    MAU Course  Procedures None  MDM +FHTs today Tylenol given in MAU. Patient is driving so will give Rx for flexeril. Patient was given previously, but never filled.   Assessment and Plan  A: Round ligament pain Diarrhea Headache  P: Discharge home Rx for Flexeril, Percocet, Abdominal binder and Phenergan sent/given to patient's pharmacy Hyperemesis and BRAT diet on AVS Patient to keep scheduled appointment to start prenatal care in [facility] clinic Patient may return to MAU as needed or if her condition were to change or worsen  Freddi Starr, PA-C  06/25/2012, 7:38 PM

## 2012-06-25 NOTE — MAU Provider Note (Signed)

## 2012-07-05 ENCOUNTER — Other Ambulatory Visit: Payer: Self-pay

## 2012-07-07 ENCOUNTER — Other Ambulatory Visit: Payer: Self-pay

## 2012-07-08 ENCOUNTER — Encounter (HOSPITAL_COMMUNITY): Payer: Self-pay

## 2012-07-08 ENCOUNTER — Inpatient Hospital Stay (HOSPITAL_COMMUNITY)
Admission: AD | Admit: 2012-07-08 | Discharge: 2012-07-08 | Disposition: A | Discharge status: Home / Self Care | Payer: Medicaid Other | Source: Ambulatory Visit | Attending: Obstetrics & Gynecology | Admitting: Obstetrics & Gynecology

## 2012-07-08 DIAGNOSIS — O2390 Unspecified genitourinary tract infection in pregnancy, unspecified trimester: Secondary | ICD-10-CM | POA: Insufficient documentation

## 2012-07-08 DIAGNOSIS — B3731 Acute candidiasis of vulva and vagina: Secondary | ICD-10-CM | POA: Insufficient documentation

## 2012-07-08 DIAGNOSIS — R109 Unspecified abdominal pain: Secondary | ICD-10-CM

## 2012-07-08 DIAGNOSIS — B373 Candidiasis of vulva and vagina: Secondary | ICD-10-CM

## 2012-07-08 LAB — URINE MICROSCOPIC-ADD ON

## 2012-07-08 LAB — URINALYSIS, ROUTINE W REFLEX MICROSCOPIC
Bilirubin Urine: NEGATIVE
Glucose, UA: NEGATIVE mg/dL
Hgb urine dipstick: NEGATIVE
Protein, ur: NEGATIVE mg/dL
Urobilinogen, UA: 0.2 mg/dL (ref 0.0–1.0)

## 2012-07-08 LAB — CBC WITH DIFFERENTIAL/PLATELET
Basophils Absolute: 0 10*3/uL (ref 0.0–0.1)
Eosinophils Absolute: 0.1 10*3/uL (ref 0.0–0.7)
Eosinophils Relative: 1 % (ref 0–5)
HCT: 32.7 % — ABNORMAL LOW (ref 36.0–46.0)
Lymphocytes Relative: 18 % (ref 12–46)
Lymphs Abs: 1.7 10*3/uL (ref 0.7–4.0)
MCH: 26 pg (ref 26.0–34.0)
MCV: 78.8 fL (ref 78.0–100.0)
Monocytes Absolute: 0.6 10*3/uL (ref 0.1–1.0)
RDW: 14.4 % (ref 11.5–15.5)
WBC: 9.5 10*3/uL (ref 4.0–10.5)

## 2012-07-08 LAB — WET PREP, GENITAL

## 2012-07-08 MED ORDER — TERCONAZOLE 0.4 % VA CREA: 1.0000 | TOPICAL_CREAM | Freq: Every day | VAGINAL | Status: DC

## 2012-07-08 NOTE — MAU Provider Note (Signed)
History     CSN: [id number]  Arrival date and time: 07/08/12 1609   None     Chief Complaint  Patient presents with  . Vaginal Bleeding  . Abdominal Cramping   HPI Jackie Brooks is 27 y.o. G1P0000 [redacted]w[redacted]d weeks presenting with cramping X 3-4 days with vaginal spotting that began today.  Describes blood as red.  She thinks she has a yeast infection.  Last intercourse 1 month.  1 partner X 3 years.  Denies fever, chills, nausea or vomiting.  Less appetie since 15 weeks pregnancy.  Thinks she may have felt baby move.  She has only had 1 bottle of gaterade today.   GC/CHL culture done 06/10/12 was negative.  She has the same partner.    Past Medical History  Diagnosis Date  . Headache     Past Surgical History  Procedure Laterality Date  . No past surgeries      Family History  Problem Relation Age of Onset  . Cancer Paternal Aunt   . Cancer Paternal Grandmother     History  Substance Use Topics  . Smoking status: Never Smoker   . Smokeless tobacco: Not on file  . Alcohol Use: No    Allergies: No Known Allergies  Prescriptions prior to admission  Medication Sig Dispense Refill  . miconazole (MONISTAT 7) 2 % vaginal cream Place 1 applicator vaginally at bedtime.      . Prenatal Vit-Fe Fumarate-FA (PRENATAL COMPLETE) 14-0.4 MG TABS Take 1 tablet by mouth daily.  60 each  0  . cyclobenzaprine (FLEXERIL) 10 MG tablet Take 1 tablet (10 mg total) by mouth 3 (three) times daily as needed for muscle spasms.  30 tablet  0  . oxyCODONE-acetaminophen (PERCOCET/ROXICET) 5-325 MG per tablet Take 1 tablet by mouth every 4 (four) hours as needed for pain.  10 tablet  0  . [DISCONTINUED] Elastic Bandages & Supports (ABDOMINAL BINDER/ELASTIC SMALL) MISC 1 Units by Does not apply route daily as needed.  1 each  0  . [DISCONTINUED] promethazine (PHENERGAN) 12.5 MG tablet Take 1 tablet (12.5 mg total) by mouth every 6 (six) hours as needed for nausea.  30 tablet  0    ROS Physical Exam    Blood pressure 101/64, pulse 99, temperature 98.9 F (37.2 C), temperature source Oral, resp. rate 16, height 4\' 11"  (1.499 m), weight 150 lb 8 oz (68.266 kg).  Physical Exam  Constitutional: She is oriented to person, place, and time. She appears well-developed and well-nourished. No distress.  HENT:  Head: Normocephalic.  Neck: Normal range of motion.  Cardiovascular: Normal rate.   Respiratory: Effort normal.  GI: Soft. She exhibits no distension and no mass. There is tenderness (mild lateral abdominal pain). There is no rebound and no guarding.  Genitourinary: There is no rash, tenderness or lesion on the right labia. There is no rash, tenderness or lesion on the left labia. Uterus is enlarged (uterus is 1 cm below umbilicus). Cervix exhibits no motion tenderness, no discharge and no friability. There is erythema around the vagina. No bleeding (no bright red or dark brown discharge/bleeding seen) around the vagina. Vaginal discharge (moderate amount of curdlike yellow discharge without odor. ) found.  Neurological: She is alert and oriented to person, place, and time.  Skin: Skin is warm and dry.  Psychiatric: She has a normal mood and affect. Her behavior is normal.   Results for orders placed during the hospital encounter of 07/08/12 (from the past 24 hour(s))  URINALYSIS, ROUTINE W REFLEX MICROSCOPIC     Status: Abnormal   Collection Time    07/08/12  4:20 PM      Result Value Range   Color, Urine YELLOW  YELLOW   APPearance CLEAR  CLEAR   Specific Gravity, Urine 1.025  1.005 - 1.030   pH 6.0  5.0 - 8.0   Glucose, UA NEGATIVE  NEGATIVE mg/dL   Hgb urine dipstick NEGATIVE  NEGATIVE   Bilirubin Urine NEGATIVE  NEGATIVE   Ketones, ur 15 (*) NEGATIVE mg/dL   Protein, ur NEGATIVE  NEGATIVE mg/dL   Urobilinogen, UA 0.2  0.0 - 1.0 mg/dL   Nitrite NEGATIVE  NEGATIVE   Leukocytes, UA LARGE (*) NEGATIVE  URINE MICROSCOPIC-ADD ON     Status: Abnormal   Collection Time    07/08/12   4:20 PM      Result Value Range   Squamous Epithelial / LPF MANY (*) RARE   WBC, UA 0-2  <3 WBC/hpf   Bacteria, UA FEW (*) RARE  WET PREP, GENITAL     Status: Abnormal   Collection Time    07/08/12  4:35 PM      Result Value Range   Yeast Wet Prep HPF POC TOO NUMEROUS TO COUNT (*) NONE SEEN   Trich, Wet Prep NONE SEEN  NONE SEEN   Clue Cells Wet Prep HPF POC NONE SEEN  NONE SEEN   WBC, Wet Prep HPF POC TOO NUMEROUS TO COUNT (*) NONE SEEN  CBC WITH DIFFERENTIAL     Status: Abnormal   Collection Time    07/08/12  5:02 PM      Result Value Range   WBC 9.5  4.0 - 10.5 K/uL   RBC 4.15  3.87 - 5.11 MIL/uL   Hemoglobin 10.8 (*) 12.0 - 15.0 g/dL   HCT 96.0 (*) 45.4 - 09.8 %   MCV 78.8  78.0 - 100.0 fL   MCH 26.0  26.0 - 34.0 pg   MCHC 33.0  30.0 - 36.0 g/dL   RDW 11.9  14.7 - 82.9 %   Platelets 255  150 - 400 K/uL   Neutrophils Relative % 75  43 - 77 %   Neutro Abs 7.1  1.7 - 7.7 K/uL   Lymphocytes Relative 18  12 - 46 %   Lymphs Abs 1.7  0.7 - 4.0 K/uL   Monocytes Relative 6  3 - 12 %   Monocytes Absolute 0.6  0.1 - 1.0 K/uL   Eosinophils Relative 1  0 - 5 %   Eosinophils Absolute 0.1  0.0 - 0.7 K/uL   Basophils Relative 0  0 - 1 %   Basophils Absolute 0.0  0.0 - 0.1 K/uL    MAU Course  Procedures  GC/CHL not repeated--NEg 06/10/12.    MDM  Assessment and Plan  A:  Abdominal pain in second trimester consistent with round ligament pain      Yeast vaginitis   P:  Keep scheduled appointment for this week, to begin prenatal care at the [facility]      Rx for Terazol 7 vaginal creme 1 applicator at hs X 7   KEY,EVE M 07/08/2012, 4:45 PM

## 2012-07-11 ENCOUNTER — Other Ambulatory Visit: Payer: Self-pay | Admitting: *Deleted

## 2012-07-11 DIAGNOSIS — Z3201 Encounter for pregnancy test, result positive: Secondary | ICD-10-CM

## 2012-07-11 NOTE — Progress Notes (Signed)
Ob labs done today. Pt has first prenatal visit on 5/21 @ 0800.  [place] for anatomy scheduled on 07/14/12 @ 1530

## 2012-07-12 LAB — OBSTETRIC PANEL
Basophils Absolute: 0 10*3/uL (ref 0.0–0.1)
Hepatitis B Surface Ag: NEGATIVE
Lymphocytes Relative: 17 % (ref 12–46)
Neutro Abs: 7 10*3/uL (ref 1.7–7.7)
Platelets: 325 10*3/uL (ref 150–400)
RBC: 4.33 MIL/uL (ref 3.87–5.11)
RDW: 15 % (ref 11.5–15.5)
Rubella: 1.52 Index — ABNORMAL HIGH (ref <0.90)
WBC: 9.1 10*3/uL (ref 4.0–10.5)

## 2012-07-12 NOTE — MAU Provider Note (Signed)
Attestation of Attending Supervision of Advanced Practitioner (PA/CNM/NP): Evaluation and management procedures were performed by the Advanced Practitioner under my supervision and collaboration.  I have reviewed the Advanced Practitioner's note and chart, and I agree with the management and plan.  Lennan Malone, MD, FACOG Attending Obstetrician & Gynecologist Faculty Practice, [facility]  

## 2012-07-13 ENCOUNTER — Encounter: Payer: Self-pay | Admitting: Obstetrics & Gynecology

## 2012-07-13 ENCOUNTER — Other Ambulatory Visit (HOSPITAL_COMMUNITY)
Admission: RE | Admit: 2012-07-13 | Discharge: 2012-07-13 | Disposition: A | Discharge status: Home / Self Care | Payer: Medicaid Other | Source: Ambulatory Visit | Attending: Obstetrics & Gynecology | Admitting: Obstetrics & Gynecology

## 2012-07-13 ENCOUNTER — Ambulatory Visit (INDEPENDENT_AMBULATORY_CARE_PROVIDER_SITE_OTHER): Payer: Medicaid Other | Admitting: Obstetrics & Gynecology

## 2012-07-13 VITALS — BP 109/69 | Wt 151.0 lb

## 2012-07-13 DIAGNOSIS — O0930 Supervision of pregnancy with insufficient antenatal care, unspecified trimester: Secondary | ICD-10-CM

## 2012-07-13 DIAGNOSIS — Z01419 Encounter for gynecological examination (general) (routine) without abnormal findings: Secondary | ICD-10-CM | POA: Insufficient documentation

## 2012-07-13 DIAGNOSIS — Z3402 Encounter for supervision of normal first pregnancy, second trimester: Secondary | ICD-10-CM

## 2012-07-13 DIAGNOSIS — Z113 Encounter for screening for infections with a predominantly sexual mode of transmission: Secondary | ICD-10-CM | POA: Insufficient documentation

## 2012-07-13 LAB — POCT URINALYSIS DIP (DEVICE)
Hgb urine dipstick: NEGATIVE
Ketones, ur: NEGATIVE mg/dL
Protein, ur: NEGATIVE mg/dL
Specific Gravity, Urine: >=1.03 (ref 1.005–1.030)
Urobilinogen, UA: 0.2 mg/dL (ref 0.0–1.0)

## 2012-07-13 LAB — HEMOGLOBINOPATHY EVALUATION
Hgb A2 Quant: 2.8 % (ref 2.2–3.2)
Hgb A: 97.2 % (ref 96.8–97.8)

## 2012-07-13 NOTE — Progress Notes (Signed)
Pulse: 105 Recommended weight gain discussed 15-25lb Currently using monistat for yeast infection on day 4.

## 2012-07-13 NOTE — Progress Notes (Signed)
   Subjective:    Jackie Brooks is a G1P0000 [redacted]w[redacted]d being seen today for her first obstetrical visit.  Her obstetrical history is significant for late prenatal care. Patient does intend to breast feed. Pregnancy history fully reviewed.  Patient reports no complaints.  Filed Vitals:   07/13/12 0904  BP: 109/69  Weight: 151 lb (68.493 kg)    HISTORY: OB History   Grav Para Term Preterm Abortions TAB SAB Ect Mult Living   1 0 0 0 0 0 0 0 0 0      # Outc Date GA Lbr Len/2nd Wgt Sex Del Anes PTL Lv   1 CUR              Past Medical History  Diagnosis Date  . Headache   . Medical history non-contributory    Past Surgical History  Procedure Laterality Date  . No past surgeries     Family History  Problem Relation Age of Onset  . Cancer Paternal Aunt   . Cancer Paternal Grandmother      Exam    Uterus:     Pelvic Exam:    Perineum: No Hemorrhoids   Vulva: normal   Vagina:  normal mucosa   pH:    Cervix: anteverted   Adnexa: normal adnexa   Bony Pelvis: android  System: Breast:  normal appearance, no masses or tenderness   Skin: normal coloration and turgor, no rashes    Neurologic: oriented   Extremities: normal strength, tone, and muscle mass   HEENT PERRLA   Mouth/Teeth mucous membranes moist, pharynx normal without lesions   Neck supple   Cardiovascular: regular rate and rhythm   Respiratory:  appears well, vitals normal, no respiratory distress, acyanotic, normal RR, ear and throat exam is normal, neck free of mass or lymphadenopathy, chest clear, no wheezing, crepitations, rhonchi, normal symmetric air entry   Abdomen: soft, non-tender; bowel sounds normal; no masses,  no organomegaly   Urinary: urethral meatus normal      Assessment:    Pregnancy: G1P0000 Patient Active Problem List   Diagnosis Date Noted  . Supervision of normal first pregnancy 07/13/2012        Plan:     Initial labs drawn. Prenatal vitamins. Problem list reviewed and  updated. Genetic Screening discussed Quad Screen: requested. It will be drawn today.  Ultrasound discussed; fetal survey: requested and scheduled for tomorrow.  Follow up in 4 weeks.   Anureet Bruington C. 07/13/2012

## 2012-07-14 ENCOUNTER — Ambulatory Visit (HOSPITAL_COMMUNITY)
Admission: RE | Admit: 2012-07-14 | Discharge: 2012-07-14 | Disposition: A | Discharge status: Home / Self Care | Payer: Medicaid Other | Source: Ambulatory Visit | Attending: Obstetrics & Gynecology | Admitting: Obstetrics & Gynecology

## 2012-07-14 DIAGNOSIS — Z3689 Encounter for other specified antenatal screening: Secondary | ICD-10-CM | POA: Insufficient documentation

## 2012-07-14 DIAGNOSIS — Z3201 Encounter for pregnancy test, result positive: Secondary | ICD-10-CM

## 2012-07-14 DIAGNOSIS — Z3402 Encounter for supervision of normal first pregnancy, second trimester: Secondary | ICD-10-CM

## 2012-07-19 ENCOUNTER — Encounter: Payer: Self-pay | Admitting: *Deleted

## 2012-07-20 ENCOUNTER — Inpatient Hospital Stay (HOSPITAL_COMMUNITY)
Admission: AD | Admit: 2012-07-20 | Discharge: 2012-07-21 | Disposition: A | Discharge status: Home / Self Care | Payer: Medicaid Other | Source: Ambulatory Visit | Attending: Obstetrics & Gynecology | Admitting: Obstetrics & Gynecology

## 2012-07-20 ENCOUNTER — Encounter (HOSPITAL_COMMUNITY): Payer: Self-pay | Admitting: *Deleted

## 2012-07-20 DIAGNOSIS — O9989 Other specified diseases and conditions complicating pregnancy, childbirth and the puerperium: Secondary | ICD-10-CM

## 2012-07-20 DIAGNOSIS — O26899 Other specified pregnancy related conditions, unspecified trimester: Secondary | ICD-10-CM

## 2012-07-20 DIAGNOSIS — O0930 Supervision of pregnancy with insufficient antenatal care, unspecified trimester: Secondary | ICD-10-CM | POA: Insufficient documentation

## 2012-07-20 DIAGNOSIS — R109 Unspecified abdominal pain: Secondary | ICD-10-CM | POA: Insufficient documentation

## 2012-07-20 DIAGNOSIS — R42 Dizziness and giddiness: Secondary | ICD-10-CM | POA: Insufficient documentation

## 2012-07-20 DIAGNOSIS — O99891 Other specified diseases and conditions complicating pregnancy: Secondary | ICD-10-CM | POA: Insufficient documentation

## 2012-07-20 DIAGNOSIS — O0932 Supervision of pregnancy with insufficient antenatal care, second trimester: Secondary | ICD-10-CM

## 2012-07-20 DIAGNOSIS — N949 Unspecified condition associated with female genital organs and menstrual cycle: Secondary | ICD-10-CM

## 2012-07-20 LAB — URINALYSIS, ROUTINE W REFLEX MICROSCOPIC
Ketones, ur: NEGATIVE mg/dL
Leukocytes, UA: NEGATIVE
Nitrite: NEGATIVE
Protein, ur: NEGATIVE mg/dL
pH: 6 (ref 5.0–8.0)

## 2012-07-20 MED ORDER — ACETAMINOPHEN 500 MG PO TABS
1000.0000 mg | ORAL_TABLET | Freq: Once | ORAL | Status: AC
  Administered 2012-07-20: 1000 mg via ORAL
  Filled 2012-07-20: qty 2

## 2012-07-20 NOTE — Progress Notes (Signed)
Pt states she felt nauseous last night with the dizziness

## 2012-07-20 NOTE — MAU Note (Signed)
Pt states she was feeling really dizzy after eating pie and strawberry smoothie. Continued to feel dizzy and after she got home after leaving the movie earlier Pt states it calmed down after about 2 hours

## 2012-07-20 NOTE — MAU Note (Signed)
Pt presents with complaint of dizziness for the last few days, very tired. Also reports sharp lower abd pain for the last 2 days, denies bleeding or ROM.

## 2012-07-20 NOTE — MAU Provider Note (Signed)
History     CSN: [id number]  Arrival date and time: 07/20/12 2213   None     Chief Complaint  Patient presents with  . Dizziness  . Abdominal Pain  . Back Pain   HPI  Jackie Brooks is a 27 y.o. G1P0000 at [redacted]w[redacted]d who presents today with dizziness. She states that she was dizzy last night after eating a sugary meal. She has not been dizzy today. She is concerned about GDM because her mother had it. She also has a headache and "sharp pains in my stomach". The pain is isolated along the belly button. She has started to feel the baby move some. She denies any bleeding or LOF or vaginal discharge.  Past Medical History  Diagnosis Date  . Headache(784.0)   . Medical history non-contributory     Past Surgical History  Procedure Laterality Date  . No past surgeries      Family History  Problem Relation Age of Onset  . Cancer Paternal Aunt   . Cancer Paternal Grandmother     History  Substance Use Topics  . Smoking status: Never Smoker   . Smokeless tobacco: Never Used  . Alcohol Use: No    Allergies: No Known Allergies  Prescriptions prior to admission  Medication Sig Dispense Refill  . cyclobenzaprine (FLEXERIL) 10 MG tablet Take 1 tablet (10 mg total) by mouth 3 (three) times daily as needed for muscle spasms.  30 tablet  0  . Prenatal Vit-Fe Fumarate-FA (PRENATAL COMPLETE) 14-0.4 MG TABS Take 1 tablet by mouth daily.  60 each  0  . terconazole (TERAZOL 7) 0.4 % vaginal cream Place 1 applicator vaginally at bedtime.  45 g  0    Review of Systems  Constitutional: Negative for fever.  Eyes: Negative for blurred vision.  Respiratory: Negative for shortness of breath.   Cardiovascular: Negative for chest pain.  Gastrointestinal: Negative for nausea, vomiting, diarrhea and constipation.  Genitourinary: Negative for dysuria, urgency and frequency.  Musculoskeletal: Negative for myalgias.  Neurological: Positive for headaches. Negative for dizziness.   Physical Exam    Blood pressure 118/74, pulse 98, temperature 98.4 F (36.9 C), temperature source Oral, resp. rate 18, height 4\' 11"  (1.499 m), weight 68.493 kg (151 lb), SpO2 100.00%.  Physical Exam  Nursing note and vitals reviewed. Constitutional: She is oriented to person, place, and time. She appears well-developed and well-nourished. No distress.  Cardiovascular: Normal rate.   Respiratory: Effort normal.  GI: Soft. There is no tenderness.  Neurological: She is alert and oriented to person, place, and time.  Skin: Skin is warm and dry.  Psychiatric: She has a normal mood and affect.    MAU Course  Procedures  Results for orders placed during the hospital encounter of 07/20/12 (from the past 24 hour(s))  URINALYSIS, ROUTINE W REFLEX MICROSCOPIC     Status: None   Collection Time    07/20/12 10:25 PM      Result Value Range   Color, Urine YELLOW  YELLOW   APPearance CLEAR  CLEAR   Specific Gravity, Urine 1.020  1.005 - 1.030   pH 6.0  5.0 - 8.0   Glucose, UA NEGATIVE  NEGATIVE mg/dL   Hgb urine dipstick NEGATIVE  NEGATIVE   Bilirubin Urine NEGATIVE  NEGATIVE   Ketones, ur NEGATIVE  NEGATIVE mg/dL   Protein, ur NEGATIVE  NEGATIVE mg/dL   Urobilinogen, UA 0.2  0.0 - 1.0 mg/dL   Nitrite NEGATIVE  NEGATIVE   Leukocytes, UA  NEGATIVE  NEGATIVE    Assessment and Plan   1. Pain of round ligament complicating pregnancy, antepartum   2. Insufficient prenatal care, second trimester    Reassured patient that we will test for GDM in a few weeks during prenatal care Comfort measures discussed Headache for tylenol tonight and reassured that tylenol is safe during pregnancy  Tawnya Crook 07/20/2012, 11:14 PM

## 2012-07-25 ENCOUNTER — Encounter: Payer: Self-pay | Admitting: Obstetrics & Gynecology

## 2012-07-26 ENCOUNTER — Encounter: Payer: Self-pay | Admitting: Obstetrics & Gynecology

## 2012-08-08 ENCOUNTER — Inpatient Hospital Stay (HOSPITAL_COMMUNITY)
Admission: AD | Admit: 2012-08-08 | Discharge: 2012-08-08 | Disposition: A | Discharge status: Home / Self Care | Payer: Medicaid Other | Source: Ambulatory Visit | Attending: Obstetrics and Gynecology | Admitting: Obstetrics and Gynecology

## 2012-08-08 DIAGNOSIS — R109 Unspecified abdominal pain: Secondary | ICD-10-CM | POA: Insufficient documentation

## 2012-08-08 DIAGNOSIS — R51 Headache: Secondary | ICD-10-CM | POA: Insufficient documentation

## 2012-08-08 LAB — URINALYSIS, ROUTINE W REFLEX MICROSCOPIC
Bilirubin Urine: NEGATIVE
Ketones, ur: NEGATIVE mg/dL
Nitrite: NEGATIVE
Specific Gravity, Urine: 1.015 (ref 1.005–1.030)
Urobilinogen, UA: 0.2 mg/dL (ref 0.0–1.0)

## 2012-08-08 NOTE — MAU Note (Signed)
Pt c/o intermittant, lower, abdominal pain with a migraine for the past week. Pt did not take any medication for the headache, but says she drinks lots of water and tries to sleep it off, but the headache returns every day. Denies any bleeding or leaking. Says she has a normal, thin, white, nonodorous discharge.

## 2012-08-10 ENCOUNTER — Encounter: Payer: Medicaid Other | Admitting: Advanced Practice Midwife

## 2012-08-17 ENCOUNTER — Inpatient Hospital Stay (HOSPITAL_COMMUNITY)
Admission: AD | Admit: 2012-08-17 | Discharge: 2012-08-17 | Disposition: A | Discharge status: Home / Self Care | Payer: Medicaid Other | Source: Ambulatory Visit | Attending: Obstetrics and Gynecology | Admitting: Obstetrics and Gynecology

## 2012-08-17 ENCOUNTER — Encounter (HOSPITAL_COMMUNITY): Payer: Self-pay | Admitting: *Deleted

## 2012-08-17 DIAGNOSIS — B373 Candidiasis of vulva and vagina: Secondary | ICD-10-CM

## 2012-08-17 DIAGNOSIS — L293 Anogenital pruritus, unspecified: Secondary | ICD-10-CM | POA: Insufficient documentation

## 2012-08-17 DIAGNOSIS — R350 Frequency of micturition: Secondary | ICD-10-CM | POA: Insufficient documentation

## 2012-08-17 DIAGNOSIS — O0932 Supervision of pregnancy with insufficient antenatal care, second trimester: Secondary | ICD-10-CM

## 2012-08-17 DIAGNOSIS — N949 Unspecified condition associated with female genital organs and menstrual cycle: Secondary | ICD-10-CM | POA: Insufficient documentation

## 2012-08-17 LAB — URINALYSIS, ROUTINE W REFLEX MICROSCOPIC
Bilirubin Urine: NEGATIVE
Hgb urine dipstick: NEGATIVE
Ketones, ur: 40 mg/dL — AB
Protein, ur: NEGATIVE mg/dL
Urobilinogen, UA: 0.2 mg/dL (ref 0.0–1.0)

## 2012-08-17 LAB — URINE MICROSCOPIC-ADD ON

## 2012-08-17 MED ORDER — TERCONAZOLE 0.4 % VA CREA: 1.0000 | TOPICAL_CREAM | Freq: Every day | VAGINAL | Status: DC

## 2012-08-17 NOTE — MAU Note (Signed)
Had sex with FOB about 1 week ago. Started to having itching and vaginal burning.  Urinary burning sometimes.  Has foul smelling discharge.  Has not been feeling baby move

## 2012-08-18 LAB — GC/CHLAMYDIA PROBE AMP
CT Probe RNA: NEGATIVE
GC Probe RNA: NEGATIVE

## 2012-08-19 LAB — URINE CULTURE: Colony Count: >=100000

## 2012-09-02 ENCOUNTER — Inpatient Hospital Stay (HOSPITAL_COMMUNITY)
Admission: AD | Admit: 2012-09-02 | Discharge: 2012-09-02 | Disposition: A | Discharge status: Home / Self Care | Payer: Medicaid Other | Source: Ambulatory Visit | Attending: Obstetrics and Gynecology | Admitting: Obstetrics and Gynecology

## 2012-09-02 ENCOUNTER — Encounter (HOSPITAL_COMMUNITY): Payer: Self-pay | Admitting: *Deleted

## 2012-09-02 DIAGNOSIS — O0932 Supervision of pregnancy with insufficient antenatal care, second trimester: Secondary | ICD-10-CM

## 2012-09-02 DIAGNOSIS — G43909 Migraine, unspecified, not intractable, without status migrainosus: Secondary | ICD-10-CM | POA: Diagnosis not present

## 2012-09-02 DIAGNOSIS — O4700 False labor before 37 completed weeks of gestation, unspecified trimester: Secondary | ICD-10-CM | POA: Insufficient documentation

## 2012-09-02 DIAGNOSIS — O0930 Supervision of pregnancy with insufficient antenatal care, unspecified trimester: Secondary | ICD-10-CM

## 2012-09-02 DIAGNOSIS — R109 Unspecified abdominal pain: Secondary | ICD-10-CM | POA: Insufficient documentation

## 2012-09-02 LAB — URINALYSIS, ROUTINE W REFLEX MICROSCOPIC
Bilirubin Urine: NEGATIVE
Nitrite: NEGATIVE
Protein, ur: NEGATIVE mg/dL
Specific Gravity, Urine: 1.02 (ref 1.005–1.030)
Urobilinogen, UA: 0.2 mg/dL (ref 0.0–1.0)

## 2012-09-02 LAB — URINE MICROSCOPIC-ADD ON

## 2012-09-02 NOTE — MAU Provider Note (Addendum)
History   27 yo G1P0 at 27 weeks presented after calling office c/o cramping x 2 days--denies leaking or bleeding, reports + FM since arriving to hospital (had felt less movement today).  No recent IC.  Does report home life is stressful--has moved back in with parents, reports she and her father have conflict, and she feels more cramping when they argue.  Denies any domestic violence.  Patient Active Problem List   Diagnosis Date Noted  . Late prenatal care 09/02/2012  . Migraines 09/02/2012     Chief Complaint  Patient presents with  . Abdominal Pain  . Back Pain  . Decreased Fetal Movement     OB History   Grav Para Term Preterm Abortions TAB SAB Ect Mult Living   1 0 0 0 0 0 0 0 0 0       Past Medical History  Diagnosis Date  . Headache(784.0)   . Medical history non-contributory     Past Surgical History  Procedure Laterality Date  . No past surgeries      Family History  Problem Relation Age of Onset  . Cancer Paternal Aunt   . Cancer Paternal Grandmother     History  Substance Use Topics  . Smoking status: Never Smoker   . Smokeless tobacco: Never Used  . Alcohol Use: No    Allergies: No Known Allergies  Prescriptions prior to admission  Medication Sig Dispense Refill  . Prenatal Vit-Fe Fumarate-FA (PRENATAL MULTIVITAMIN) TABS Take 1 tablet by mouth daily at 12 noon.         Physical Exam   Blood pressure 99/67, pulse 103, temperature 98 F (36.7 C), temperature source Oral, resp. rate 18, height 4\' 11"  (1.499 m), weight 159 lb (72.122 kg), SpO2 100.00%.  Chest clear Heart RRR without murmur Abd gravid, NT Pelvic--cervix closed and long Ext WNL  FHR reassuring for EGA Occasional mild irritability  Results for orders placed during the hospital encounter of 09/02/12 (from the past 24 hour(s))  URINALYSIS, ROUTINE W REFLEX MICROSCOPIC     Status: Abnormal   Collection Time    09/02/12  1:08 PM      Result Value Range   Color, Urine  YELLOW  YELLOW   APPearance HAZY (*) CLEAR   Specific Gravity, Urine 1.020  1.005 - 1.030   pH 7.0  5.0 - 8.0   Glucose, UA NEGATIVE  NEGATIVE mg/dL   Hgb urine dipstick NEGATIVE  NEGATIVE   Bilirubin Urine NEGATIVE  NEGATIVE   Ketones, ur NEGATIVE  NEGATIVE mg/dL   Protein, ur NEGATIVE  NEGATIVE mg/dL   Urobilinogen, UA 0.2  0.0 - 1.0 mg/dL   Nitrite NEGATIVE  NEGATIVE   Leukocytes, UA MODERATE (*) NEGATIVE  URINE MICROSCOPIC-ADD ON     Status: Abnormal   Collection Time    09/02/12  1:08 PM      Result Value Range   Squamous Epithelial / LPF MANY (*) RARE   WBC, UA 0-2  <3 WBC/hpf   Bacteria, UA FEW (*) RARE  FETAL FIBRONECTIN     Status: None   Collection Time    09/02/12  2:00 PM      Result Value Range   Fetal Fibronectin NEGATIVE  NEGATIVE   Urine to culture  ED Course  IUP at 27 weeks No evidence PTL  Plan: Recommended increasing po fluid intake, and working on stress management. Await urine culture--will defer treatment since asymptomatic. S/S PTL reviewed. Keep scheduled appt at [facility] on  09/14/12 or call prn.     Nigel Bridgeman CNM, [place] 09/02/2012 3:28 PM

## 2012-09-02 NOTE — MAU Note (Signed)
No fetal movement in 2 days.  Lower back and abd pain, started 2 days ago.

## 2012-09-04 LAB — URINE CULTURE: Colony Count: >=100000

## 2012-10-03 ENCOUNTER — Inpatient Hospital Stay (HOSPITAL_COMMUNITY)
Admission: AD | Admit: 2012-10-03 | Discharge: 2012-10-03 | Disposition: A | Discharge status: Home / Self Care | Payer: Medicaid Other | Source: Ambulatory Visit | Attending: Obstetrics and Gynecology | Admitting: Obstetrics and Gynecology

## 2012-10-03 ENCOUNTER — Encounter (HOSPITAL_COMMUNITY): Payer: Self-pay | Admitting: *Deleted

## 2012-10-03 ENCOUNTER — Other Ambulatory Visit: Payer: Self-pay

## 2012-10-03 DIAGNOSIS — O368190 Decreased fetal movements, unspecified trimester, not applicable or unspecified: Secondary | ICD-10-CM | POA: Insufficient documentation

## 2012-10-03 DIAGNOSIS — B3731 Acute candidiasis of vulva and vagina: Secondary | ICD-10-CM | POA: Insufficient documentation

## 2012-10-03 DIAGNOSIS — B373 Candidiasis of vulva and vagina: Secondary | ICD-10-CM

## 2012-10-03 DIAGNOSIS — O2390 Unspecified genitourinary tract infection in pregnancy, unspecified trimester: Secondary | ICD-10-CM | POA: Insufficient documentation

## 2012-10-03 DIAGNOSIS — O368130 Decreased fetal movements, third trimester, not applicable or unspecified: Secondary | ICD-10-CM

## 2012-10-03 LAB — URINALYSIS, ROUTINE W REFLEX MICROSCOPIC
Bilirubin Urine: NEGATIVE
Ketones, ur: NEGATIVE mg/dL
Nitrite: NEGATIVE
Protein, ur: NEGATIVE mg/dL
Specific Gravity, Urine: 1.025 (ref 1.005–1.030)
Urobilinogen, UA: 0.2 mg/dL (ref 0.0–1.0)

## 2012-10-03 LAB — URINE MICROSCOPIC-ADD ON

## 2012-10-03 LAB — WET PREP, GENITAL: Clue Cells Wet Prep HPF POC: NONE SEEN

## 2012-10-03 MED ORDER — TERCONAZOLE 0.4 % VA CREA: 1.0000 | TOPICAL_CREAM | Freq: Every day | VAGINAL | Status: DC

## 2012-10-03 NOTE — MAU Note (Signed)
Pt presents with c/o decreased fetal movement, change in vaginal discharge to thick and yellow but no foul odor, and occasional cramping. Pt denies pih symptoms, bleeding or leakage of fluid.

## 2012-10-03 NOTE — MAU Note (Signed)
Pt reports she has not felt baby move since this morning. Having some abd cramping on and off and noticed a thick yellow vaginal discharge x 2 days.

## 2012-10-03 NOTE — MAU Note (Signed)
Pt also with c/o hands and feet swelling.  Pt states she can not make a fist

## 2012-10-03 NOTE — MAU Provider Note (Signed)
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  Tried kick counts at home and didn't feel movement, but since arriving to MAU has felt movements.  Reports occ'l lower abdominal cramping.  No LOF or VB.  Noticed a "bump" on left labia in last few days.  No UTI or PIH s/s.  No resp or GI c/o's otherwise.    CSN: [id number]  Arrival date and time: 10/03/12 1526   First Provider Initiated Contact with Patient 10/03/12 1618      Chief Complaint  Patient presents with  . Decreased Fetal Movement  . Abdominal Cramping   HPI  OB History   Grav Para Term Preterm Abortions TAB SAB Ect Mult Living   1 0 0 0 0 0 0 0 0 0       Past Medical History  Diagnosis Date  . Headache(784.0)   . Medical history non-contributory     Past Surgical History  Procedure Laterality Date  . No past surgeries      Family History  Problem Relation Age of Onset  . Cancer Paternal Aunt   . Cancer Paternal Grandmother     History  Substance Use Topics  . Smoking status: Never Smoker   . Smokeless tobacco: Never Used  . Alcohol Use: No    Allergies: No Known Allergies  Prescriptions prior to admission  Medication Sig Dispense Refill  . calcium carbonate (TUMS - DOSED IN MG ELEMENTAL CALCIUM) 500 MG chewable tablet Chew 1 tablet by mouth as needed for heartburn.      . Prenatal Vit-Fe Fumarate-FA (PRENATAL MULTIVITAMIN) TABS Take 1 tablet by mouth daily at 12 noon.        ROS--see HPI Physical Exam   Blood pressure 120/68, pulse 116, temperature 97.9 F (36.6 C), temperature source Oral, resp. rate 18, height 4\' 11"  (1.499 m), weight 165 lb 12.8 oz (75.206 kg).  Physical Exam  Constitutional: She is oriented to person, place, and time. She appears well-developed and well-nourished. No distress.  HENT:  Head: Normocephalic and atraumatic.  Eyes: Pupils are equal, round, and reactive to light.   Cardiovascular: Normal rate.   Respiratory: Effort normal.  GI: Soft.  gravid  Genitourinary:  Bump on Lt labia minora--possibly early folliculitis from shaving. Scant d/c at introitus Deferred cervical exam  Neurological: She is alert and oriented to person, place, and time.  Skin: Skin is warm and dry.  Psychiatric: She has a normal mood and affect. Her behavior is normal. Judgment and thought content normal.   .. Results for orders placed during the hospital encounter of 10/03/12 (from the past 24 hour(s))  URINALYSIS, ROUTINE W REFLEX MICROSCOPIC     Status: Abnormal   Collection Time    10/03/12  3:40 PM      Result Value Range   Color, Urine YELLOW  YELLOW   APPearance HAZY (*) CLEAR   Specific Gravity, Urine 1.025  1.005 - 1.030   pH 6.0  5.0 - 8.0   Glucose, UA NEGATIVE  NEGATIVE mg/dL   Hgb urine dipstick NEGATIVE  NEGATIVE   Bilirubin Urine NEGATIVE  NEGATIVE   Ketones, ur NEGATIVE  NEGATIVE mg/dL   Protein, ur NEGATIVE  NEGATIVE mg/dL   Urobilinogen, UA 0.2  0.0 - 1.0 mg/dL   Nitrite NEGATIVE  NEGATIVE   Leukocytes, UA LARGE (*) NEGATIVE  URINE MICROSCOPIC-ADD ON  Status: Abnormal   Collection Time    10/03/12  3:40 PM      Result Value Range   Squamous Epithelial / LPF FEW (*) RARE   WBC, UA 3-6  <3 WBC/hpf  WET PREP, GENITAL     Status: Abnormal   Collection Time    10/03/12  4:20 PM      Result Value Range   Yeast Wet Prep HPF POC FEW (*) NONE SEEN   Trich, Wet Prep NONE SEEN  NONE SEEN   Clue Cells Wet Prep HPF POC NONE SEEN  NONE SEEN   WBC, Wet Prep HPF POC FEW (*) NONE SEEN   MAU Course  Procedures 1. Wet prep 2. NST--145, reactive, mod variability, no decels; TOCO: rare ctx  Assessment and Plan  1. [redacted]w[redacted]d 2. Decreased FM w/ reactive FHT 3.  Yeast vaginitis  1. D/c'd home w/ [facility] 2. F/u next week as already scheduled at [facility], or f/u prn 3. Called in Rx for Terazol #7 1 app full pv qhs x7 nights to [facility] on [address]   Allegra Cerniglia  H 10/03/2012, 4:29 PM

## 2012-10-17 ENCOUNTER — Inpatient Hospital Stay (HOSPITAL_COMMUNITY)
Admission: AD | Admit: 2012-10-17 | Discharge: 2012-10-17 | Disposition: A | Discharge status: Home / Self Care | Payer: Medicaid Other | Source: Ambulatory Visit | Attending: Obstetrics and Gynecology | Admitting: Obstetrics and Gynecology

## 2012-10-17 ENCOUNTER — Encounter (HOSPITAL_COMMUNITY): Payer: Self-pay | Admitting: *Deleted

## 2012-10-17 ENCOUNTER — Inpatient Hospital Stay (HOSPITAL_COMMUNITY): Payer: Medicaid Other

## 2012-10-17 DIAGNOSIS — O4700 False labor before 37 completed weeks of gestation, unspecified trimester: Secondary | ICD-10-CM | POA: Insufficient documentation

## 2012-10-17 DIAGNOSIS — R109 Unspecified abdominal pain: Secondary | ICD-10-CM | POA: Insufficient documentation

## 2012-10-17 DIAGNOSIS — O368190 Decreased fetal movements, unspecified trimester, not applicable or unspecified: Secondary | ICD-10-CM | POA: Insufficient documentation

## 2012-10-17 HISTORY — DX: Unspecified infectious disease: B99.9

## 2012-10-17 LAB — WET PREP, GENITAL
Clue Cells Wet Prep HPF POC: NONE SEEN
Yeast Wet Prep HPF POC: NONE SEEN

## 2012-10-17 LAB — FETAL FIBRONECTIN: Fetal Fibronectin: NEGATIVE

## 2012-10-17 LAB — URINE MICROSCOPIC-ADD ON

## 2012-10-17 LAB — URINALYSIS, ROUTINE W REFLEX MICROSCOPIC
Nitrite: NEGATIVE
Specific Gravity, Urine: 1.015 (ref 1.005–1.030)
Urobilinogen, UA: 0.2 mg/dL (ref 0.0–1.0)
pH: 6.5 (ref 5.0–8.0)

## 2012-10-17 NOTE — MAU Note (Signed)
Pain in lower abd, for past 2 days, comes and goes.  Lots of pressure feels like it is going to explode.  Denies pain or burning with urination, reports increased frequency though.

## 2012-10-17 NOTE — MAU Provider Note (Signed)
History   27 yo, G1P0 at [redacted]w[redacted]d presents with pain in lower abd for past 2 days, pain is intermittent. Pt reports decreased FM, last felt the baby 9pm yesterday evening.  Pt also c/o vaginal pressure, "feels like it is going to explode." Denies pain or burning with urination, but reports increased frequency.  Denies VB, LOF, recent fever, resp or GI c/o's, PIH s/s.    No chief complaint on file.   OB History   Grav Para Term Preterm Abortions TAB SAB Ect Mult Living   1 0 0 0 0 0 0 0 0 0       Past Medical History  Diagnosis Date  . Headache(784.0)   . Medical history non-contributory   . Infection     UTI    Past Surgical History  Procedure Laterality Date  . No past surgeries      Family History  Problem Relation Age of Onset  . Cancer Paternal Aunt     ? brain  . Cancer Paternal Grandmother     breast  . Hypertension Mother     History  Substance Use Topics  . Smoking status: Passive Smoke Exposure - Never Smoker  . Smokeless tobacco: Never Used     Comment: father smokes  . Alcohol Use: No    Allergies: No Known Allergies  No prescriptions prior to admission    ROS: see HPI above, all other systems are negative   Physical Exam   Blood pressure 114/67, pulse 104, temperature 98 F (36.7 C), temperature source Oral, resp. rate 18, height 4\' 11"  (1.499 m), weight 167 lb (75.751 kg).  Chest: Clear Heart: RRR Abdomen: gravid, NT Extremities: WNL  Pelvic exam: normal external genitalia, vulva, vagina, cervix, uterus and adnexa.  Closed / Th / High  FHT: Reactive NST UCs: Occasional  ED Course  IUP at [redacted]w[redacted]d PTL evaluation Decreased FM  NST - reactive BPP - 8/8 FFN - neg  D/c home with PTL precautions F/u at [facility] on 9/3    Haroldine Laws CNM, MSN 10/17/2012 6:24 PM

## 2012-10-20 LAB — URINE CULTURE: Colony Count: >=100000

## 2012-10-31 ENCOUNTER — Encounter (HOSPITAL_COMMUNITY): Payer: Self-pay | Admitting: *Deleted

## 2012-10-31 ENCOUNTER — Inpatient Hospital Stay (HOSPITAL_COMMUNITY)
Admission: AD | Admit: 2012-10-31 | Discharge: 2012-11-01 | Disposition: A | Discharge status: Home / Self Care | Payer: Medicaid Other | Source: Ambulatory Visit | Attending: Obstetrics and Gynecology | Admitting: Obstetrics and Gynecology

## 2012-10-31 DIAGNOSIS — M545 Low back pain, unspecified: Secondary | ICD-10-CM | POA: Insufficient documentation

## 2012-10-31 DIAGNOSIS — O99891 Other specified diseases and conditions complicating pregnancy: Secondary | ICD-10-CM | POA: Insufficient documentation

## 2012-10-31 DIAGNOSIS — O4700 False labor before 37 completed weeks of gestation, unspecified trimester: Secondary | ICD-10-CM | POA: Insufficient documentation

## 2012-10-31 DIAGNOSIS — R109 Unspecified abdominal pain: Secondary | ICD-10-CM | POA: Insufficient documentation

## 2012-10-31 LAB — URINE MICROSCOPIC-ADD ON

## 2012-10-31 LAB — URINALYSIS, ROUTINE W REFLEX MICROSCOPIC
Bilirubin Urine: NEGATIVE
Ketones, ur: NEGATIVE mg/dL
Nitrite: NEGATIVE
Protein, ur: NEGATIVE mg/dL
Specific Gravity, Urine: 1.01 (ref 1.005–1.030)
Urobilinogen, UA: 0.2 mg/dL (ref 0.0–1.0)

## 2012-10-31 NOTE — MAU Note (Signed)
PT SAYS SHE HAS LOWER ABD  AND BACK PAIN  THAT STARTED  AT  0700.    SHE CALLED OFFICE ,  DRANK WATER,    THEN CALLED AGAIN.  SHE LEFT MESSAGE.   LAST SEEN IN OFFICE -   NO VE.   LAST SEX- 4 MTHS    DENIES HSV AND MRSA.

## 2012-10-31 NOTE — MAU Provider Note (Signed)
History   27 yo G1P0 at 33 3/7 weeks presented c/o lower abdominal cramping and low back pain since 7pm.  Denies leaking or bleeding, reports +FM.  No dysuria or fever.  No IC in 4 months, no N/V.  Patient Active Problem List   Diagnosis Date Noted  . Late prenatal care 09/02/2012  . Migraines 09/02/2012  Group A strep in urine from 8/25 visit to MAU--not GBS positive.    OB History   Grav Para Term Preterm Abortions TAB SAB Ect Mult Living   1 0 0 0 0 0 0 0 0 0       Past Medical History  Diagnosis Date  . Headache(784.0)   . Medical history non-contributory   . Infection     UTI    Past Surgical History  Procedure Laterality Date  . No past surgeries      Family History  Problem Relation Age of Onset  . Cancer Paternal Aunt     ? brain  . Cancer Paternal Grandmother     breast  . Hypertension Mother     History  Substance Use Topics  . Smoking status: Passive Smoke Exposure - Never Smoker  . Smokeless tobacco: Never Used     Comment: father smokes  . Alcohol Use: No    Allergies: No Known Allergies  Prescriptions prior to admission  Medication Sig Dispense Refill  . Prenatal Vit-Fe Fumarate-FA (PRENATAL MULTIVITAMIN) TABS Take 1 tablet by mouth daily at 12 noon.         Physical Exam   Blood pressure 110/68, pulse 108, temperature 98.1 F (36.7 C), temperature source Oral, resp. rate 18, height 4\' 10"  (1.473 m), weight 170 lb 2 oz (77.168 kg).  Chest clear Heart RRR without murmur Abd gravid, NT Pelvic--cervix closed, long, posterior, vtx, -2, no d/c in vault. Ext WNL No CVAT  FHR Category 1 UCs irritability  ED Course  IUP at 35 3/7 weeks Uterine irritability  Plan: Push po fluids UA, with GC/chlamydia added to testing.   Nigel Bridgeman CNM, [place] 10/31/2012 11:25 PM  Addendum:  Results for orders placed during the hospital encounter of 10/31/12 (from the past 24 hour(s))  URINALYSIS, ROUTINE W REFLEX MICROSCOPIC     Status: Abnormal   Collection Time    10/31/12 10:40 PM      Result Value Range   Color, Urine YELLOW  YELLOW   APPearance CLEAR  CLEAR   Specific Gravity, Urine 1.010  1.005 - 1.030   pH 6.0  5.0 - 8.0   Glucose, UA NEGATIVE  NEGATIVE mg/dL   Hgb urine dipstick TRACE (*) NEGATIVE   Bilirubin Urine NEGATIVE  NEGATIVE   Ketones, ur NEGATIVE  NEGATIVE mg/dL   Protein, ur NEGATIVE  NEGATIVE mg/dL   Urobilinogen, UA 0.2  0.0 - 1.0 mg/dL   Nitrite NEGATIVE  NEGATIVE   Leukocytes, UA LARGE (*) NEGATIVE  URINE MICROSCOPIC-ADD ON     Status: Abnormal   Collection Time    10/31/12 10:40 PM      Result Value Range   Squamous Epithelial / LPF MANY (*) RARE   WBC, UA 21-50  <3 WBC/hpf   Bacteria, UA MANY (*) RARE   Urine-Other RARE YEAST     FHR Category 1 UCs--mild irritability, occasional mild contractions.  Impression: IUP at 35 3/7 weeks Uterine irritability, musculoskeletal pelvic pain ?UTI  Plan: D/C home Patient unable to take pills. Rx Keflex 500 mg po BID x 7 days (liquid solution)  Rx Vicodin 5/325 po q 4 hours prn (liquid solution) Keep scheduled appt on 9/11 and f/u prn. Push po fluids. Call with any worsening of sx. Urine to culture.  Nigel Bridgeman, CNM 10/31/12 11:45p

## 2012-11-01 MED ORDER — CEPHALEXIN 125 MG/5ML PO SUSR: 500.0000 mg | Freq: Two times a day (BID) | ORAL | Status: AC

## 2012-11-01 MED ORDER — HYDROCODONE-ACETAMINOPHEN 2.5-108 MG/5ML PO SOLN: 5.0000 mL | ORAL | Status: DC | PRN

## 2012-11-02 LAB — URINE CULTURE
Colony Count: NO GROWTH
Culture: NO GROWTH

## 2012-11-03 LAB — OB RESULTS CONSOLE GC/CHLAMYDIA
Chlamydia: NEGATIVE
Gonorrhea: NEGATIVE

## 2012-11-03 LAB — OB RESULTS CONSOLE GBS: GBS: NEGATIVE

## 2012-11-12 ENCOUNTER — Encounter (HOSPITAL_COMMUNITY): Payer: Self-pay | Admitting: *Deleted

## 2012-11-12 ENCOUNTER — Inpatient Hospital Stay (HOSPITAL_COMMUNITY)
Admission: AD | Admit: 2012-11-12 | Discharge: 2012-11-12 | Disposition: A | Discharge status: Home / Self Care | Payer: Medicaid Other | Source: Ambulatory Visit | Attending: Obstetrics and Gynecology | Admitting: Obstetrics and Gynecology

## 2012-11-12 DIAGNOSIS — O479 False labor, unspecified: Secondary | ICD-10-CM | POA: Insufficient documentation

## 2012-11-12 LAB — URINALYSIS, ROUTINE W REFLEX MICROSCOPIC
Glucose, UA: NEGATIVE mg/dL
Hgb urine dipstick: NEGATIVE
Ketones, ur: NEGATIVE mg/dL
Protein, ur: NEGATIVE mg/dL

## 2012-11-12 LAB — URINE MICROSCOPIC-ADD ON

## 2012-11-12 NOTE — Progress Notes (Signed)
Notified pt here for labor eval, one u/c noted thus far w ui, RN to leave pt on monitor and CNM will come see pt.

## 2012-11-12 NOTE — MAU Note (Signed)
Patient states that she started contracting about 11/2-2 hours ago and started out every 5 minutes but are now every 2 minutes.

## 2012-11-12 NOTE — MAU Provider Note (Signed)
History    27 yo G1P0 at 3 1/7 weeks presented after calling from the car with contractions "every 2 min" x 20 min while out shopping with family.  Was on the way to the hospital when she called, declined observing UCs further before coming to hospital.  Denies leaking or bleeding, reports +FM.  Seen 9/8 in MAU for pelvic pain and UTI sx--Rx'd with Keflex, has completed course; and Vicodin--has not taken.  Patient Active Problem List   Diagnosis Date Noted  . Late prenatal care 09/02/2012  . Migraines 09/02/2012     Chief Complaint  Patient presents with  . Labor Eval     OB History   Grav Para Term Preterm Abortions TAB SAB Ect Mult Living   1 0 0 0 0 0 0 0 0 0       Past Medical History  Diagnosis Date  . Headache(784.0)   . Medical history non-contributory   . Infection     UTI    Past Surgical History  Procedure Laterality Date  . No past surgeries      Family History  Problem Relation Age of Onset  . Cancer Paternal Aunt     ? brain  . Cancer Paternal Grandmother     breast  . Hypertension Mother     History  Substance Use Topics  . Smoking status: Passive Smoke Exposure - Never Smoker  . Smokeless tobacco: Never Used     Comment: father smokes  . Alcohol Use: No    Allergies: No Known Allergies  Prescriptions prior to admission  Medication Sig Dispense Refill  . cephALEXin (KEFLEX) 250 MG/5ML suspension Take 500 mg by mouth 2 (two) times daily. For 7 days      . Prenatal Vit-Fe Fumarate-FA (PRENATAL MULTIVITAMIN) TABS Take 1 tablet by mouth daily.       . Hydrocodone-Acetaminophen 2.5-108 MG/5ML SOLN Take 5 mLs by mouth every 4 (four) hours as needed.  120 mL  0   Physical Exam   Blood pressure 124/70, pulse 116, temperature 98.8 F (37.1 C), temperature source Oral, resp. rate 22, height 4\' 11"  (1.499 m), weight 159 lb (72.122 kg), SpO2 99.00%, unknown if currently breastfeeding.  Chest clear Heart RRR without murmur Abd gravid,  NT Pelvic--cervix closed, long, vtx -2.  No d/c. Ext WNL  FHR Category 1 UCs uterine irritability noted, with sporadic, more defined UCs q 7-9 min  ED Course  IUP at 37 1/7 weeks Uterine irritability vs prodromal labor Category 1 FHR  Plan: D/C home--reviewed with patient and several family members s/s of true labor. Comfort measures for cramping discussed. To keep scheduled appt at [facility] 9/26 or to call prn.   Nigel Bridgeman CNM, [place] 11/12/2012 5:19 PM

## 2012-11-12 NOTE — Progress Notes (Signed)
CNM finishing delivery note on birthing suites and will be down shortly.

## 2012-12-05 ENCOUNTER — Encounter (HOSPITAL_COMMUNITY): Payer: Self-pay | Admitting: *Deleted

## 2012-12-05 ENCOUNTER — Telehealth (HOSPITAL_COMMUNITY): Payer: Self-pay | Admitting: *Deleted

## 2012-12-05 NOTE — Telephone Encounter (Signed)
Preadmission screen  

## 2012-12-07 ENCOUNTER — Inpatient Hospital Stay (HOSPITAL_COMMUNITY)
Admission: AD | Admit: 2012-12-07 | Discharge: 2012-12-07 | Disposition: A | Discharge status: Home / Self Care | Payer: Medicaid Other | Source: Ambulatory Visit | Attending: Obstetrics and Gynecology | Admitting: Obstetrics and Gynecology

## 2012-12-07 ENCOUNTER — Encounter (HOSPITAL_COMMUNITY): Payer: Self-pay | Admitting: *Deleted

## 2012-12-07 DIAGNOSIS — O479 False labor, unspecified: Secondary | ICD-10-CM | POA: Insufficient documentation

## 2012-12-07 DIAGNOSIS — B373 Candidiasis of vulva and vagina: Secondary | ICD-10-CM | POA: Insufficient documentation

## 2012-12-07 DIAGNOSIS — B3731 Acute candidiasis of vulva and vagina: Secondary | ICD-10-CM | POA: Insufficient documentation

## 2012-12-07 DIAGNOSIS — O368190 Decreased fetal movements, unspecified trimester, not applicable or unspecified: Secondary | ICD-10-CM | POA: Insufficient documentation

## 2012-12-07 DIAGNOSIS — O2390 Unspecified genitourinary tract infection in pregnancy, unspecified trimester: Secondary | ICD-10-CM | POA: Insufficient documentation

## 2012-12-07 LAB — WET PREP, GENITAL: Trich, Wet Prep: NONE SEEN

## 2012-12-07 MED ORDER — TERCONAZOLE 0.4 % VA CREA: 1.0000 | TOPICAL_CREAM | Freq: Every day | VAGINAL | Status: DC

## 2012-12-07 NOTE — MAU Note (Signed)
Audible fetal movement heard. Fetal movement palpated. Marland Kitchen

## 2012-12-07 NOTE — MAU Provider Note (Signed)
History   27 yo G1P0 at 59 5/7 weeks presented after calling c/o decreased FM tonight.  Has some ? Wetness, with slightly vulvar irritation.  Scheduled for induction Friday, 10/17, with last PN visit on 10/16 for NST.  Patient Active Problem List   Diagnosis Date Noted  . Late prenatal care 09/02/2012  . Migraines 09/02/2012      HPI:  See above  OB History   Grav Para Term Preterm Abortions TAB SAB Ect Mult Living   1 0 0 0 0 0 0 0 0 0       Past Medical History  Diagnosis Date  . Headache(784.0)   . Medical history non-contributory   . Infection     UTI    Past Surgical History  Procedure Laterality Date  . No past surgeries      Family History  Problem Relation Age of Onset  . Cancer Paternal Aunt     ? brain  . Cancer Paternal Grandmother     breast  . Hypertension Mother     History  Substance Use Topics  . Smoking status: Passive Smoke Exposure - Never Smoker  . Smokeless tobacco: Never Used     Comment: father smokes  . Alcohol Use: No    Allergies: No Known Allergies  Prescriptions prior to admission  Medication Sig Dispense Refill  . Prenatal Vit-Fe Fumarate-FA (PRENATAL MULTIVITAMIN) TABS Take 1 tablet by mouth daily.       . Hydrocodone-Acetaminophen 2.5-108 MG/5ML SOLN Take 5 mLs by mouth every 4 (four) hours as needed.  120 mL  0    ROS:  Vaginal d/c, occasional contractions, decreased FM. Physical Exam   Blood pressure 112/75, pulse 110, temperature 98.1 F (36.7 C), temperature source Oral, resp. rate 20, height 4\' 11"  (1.499 m), weight 174 lb 2 oz (78.983 kg).  Physical Exam Chest clear Heart RRR without murmur Abd gravid, NT Pelvic--posterior, FT, thick, soft, vtx, -2.  Curdy yellow d/c noted, small amount additional d/c noted in vault. Ext WNL  FHR Category 1, with much FM--patient now aware of FM UCs--irregular, mild.  Results for orders placed during the hospital encounter of 12/07/12 (from the past 24 hour(s))  AMNISURE  RUPTURE OF MEMBRANE (ROM)     Status: None   Collection Time    12/07/12  2:02 AM      Result Value Range   Amnisure ROM NEGATIVE    WET PREP, GENITAL     Status: Abnormal   Collection Time    12/07/12  2:02 AM      Result Value Range   Yeast Wet Prep HPF POC MODERATE (*) NONE SEEN   Trich, Wet Prep NONE SEEN  NONE SEEN   Clue Cells Wet Prep HPF POC NONE SEEN  NONE SEEN   WBC, Wet Prep HPF POC MODERATE (*) NONE SEEN     ED Course  IUP at 40 5/7 weeks Yeast vaginitis Reactive FHR, with good FM   Plan: D/C home with labor precautions. Rx Terazol 7 cream Keep scheduled appt on Thursday at office, then on Friday for induction.    Nigel Bridgeman CNM, [place] 12/07/2012 2:06 AM

## 2012-12-07 NOTE — MAU Note (Signed)
PT SAYS SHE HAS VAG D/C  - THAT IS YELLOW, HAS ODOR,  WHEN VOIDS- ITS BURNS-  STARTED  ON 10-7.     WENT TO OFFICE  LAST  WEEK- - SWABBED -  CAME BACK  NEG-   FROM NP.  CALLED HERE  2 DAYS AGO-.  LAST SEX- 4 MTHS.   IS [facility] FOR AN INDUCTION ON Friday AT 715PM.      DENIES HSV AND MRSA.     HAS DECREASE  MOVEMENT OF BABY-  LAST TIME  WAS 4 HRS AGO.   FHR- IN TRIAGE -152

## 2012-12-09 ENCOUNTER — Inpatient Hospital Stay (HOSPITAL_COMMUNITY)
Admission: RE | Admit: 2012-12-09 | Discharge: 2012-12-13 | DRG: 765 | Disposition: A | Discharge status: Home / Self Care | Payer: Medicaid Other | Source: Ambulatory Visit | Attending: Obstetrics and Gynecology | Admitting: Obstetrics and Gynecology

## 2012-12-09 ENCOUNTER — Encounter (HOSPITAL_COMMUNITY): Payer: Self-pay

## 2012-12-09 DIAGNOSIS — E669 Obesity, unspecified: Secondary | ICD-10-CM | POA: Diagnosis present

## 2012-12-09 DIAGNOSIS — D649 Anemia, unspecified: Secondary | ICD-10-CM | POA: Diagnosis not present

## 2012-12-09 DIAGNOSIS — O0930 Supervision of pregnancy with insufficient antenatal care, unspecified trimester: Secondary | ICD-10-CM

## 2012-12-09 DIAGNOSIS — O368190 Decreased fetal movements, unspecified trimester, not applicable or unspecified: Secondary | ICD-10-CM | POA: Diagnosis present

## 2012-12-09 DIAGNOSIS — O480 Post-term pregnancy: Principal | ICD-10-CM | POA: Diagnosis present

## 2012-12-09 DIAGNOSIS — O9903 Anemia complicating the puerperium: Secondary | ICD-10-CM | POA: Diagnosis not present

## 2012-12-09 DIAGNOSIS — O411090 Infection of amniotic sac and membranes, unspecified, unspecified trimester, not applicable or unspecified: Secondary | ICD-10-CM | POA: Diagnosis present

## 2012-12-09 LAB — CBC
HCT: 34.8 % — ABNORMAL LOW (ref 36.0–46.0)
Hemoglobin: 11.5 g/dL — ABNORMAL LOW (ref 12.0–15.0)
MCV: 76.7 fL — ABNORMAL LOW (ref 78.0–100.0)
RBC: 4.54 MIL/uL (ref 3.87–5.11)
WBC: 9.4 10*3/uL (ref 4.0–10.5)

## 2012-12-09 MED ORDER — OXYTOCIN 40 UNITS IN LACTATED RINGERS INFUSION - SIMPLE MED
62.5000 mL/h | INTRAVENOUS | Status: DC
  Filled 2012-12-09: qty 1000

## 2012-12-09 MED ORDER — PROMETHAZINE HCL 25 MG/ML IJ SOLN
25.0000 mg | Freq: Once | INTRAMUSCULAR | Status: AC
  Administered 2012-12-10: 25 mg via INTRAVENOUS
  Filled 2012-12-09: qty 1

## 2012-12-09 MED ORDER — ZOLPIDEM TARTRATE 5 MG PO TABS: 5.0000 mg | ORAL_TABLET | Freq: Every evening | ORAL | Status: DC | PRN

## 2012-12-09 MED ORDER — CITRIC ACID-SODIUM CITRATE 334-500 MG/5ML PO SOLN
30.0000 mL | ORAL | Status: DC | PRN
  Administered 2012-12-11: 30 mL via ORAL
  Filled 2012-12-09: qty 15

## 2012-12-09 MED ORDER — MISOPROSTOL 25 MCG QUARTER TABLET
25.0000 ug | ORAL_TABLET | ORAL | Status: DC | PRN
  Administered 2012-12-09: 25 ug via VAGINAL
  Filled 2012-12-09: qty 0.25

## 2012-12-09 MED ORDER — ACETAMINOPHEN 325 MG PO TABS
650.0000 mg | ORAL_TABLET | ORAL | Status: DC | PRN
  Filled 2012-12-09: qty 2

## 2012-12-09 MED ORDER — OXYTOCIN BOLUS FROM INFUSION: 500.0000 mL | INTRAVENOUS | Status: DC

## 2012-12-09 MED ORDER — OXYCODONE-ACETAMINOPHEN 5-325 MG PO TABS: 1.0000 | ORAL_TABLET | ORAL | Status: DC | PRN

## 2012-12-09 MED ORDER — LIDOCAINE HCL (PF) 1 % IJ SOLN: 30.0000 mL | INTRAMUSCULAR | Status: DC | PRN

## 2012-12-09 MED ORDER — LACTATED RINGERS IV SOLN
INTRAVENOUS | Status: DC
  Administered 2012-12-09 – 2012-12-11 (×6): via INTRAVENOUS

## 2012-12-09 MED ORDER — IBUPROFEN 600 MG PO TABS: 600.0000 mg | ORAL_TABLET | Freq: Four times a day (QID) | ORAL | Status: DC | PRN

## 2012-12-09 MED ORDER — ONDANSETRON HCL 4 MG/2ML IJ SOLN: 4.0000 mg | Freq: Four times a day (QID) | INTRAMUSCULAR | Status: DC | PRN

## 2012-12-09 MED ORDER — FLEET ENEMA 7-19 GM/118ML RE ENEM: 1.0000 | ENEMA | RECTAL | Status: DC | PRN

## 2012-12-09 MED ORDER — NALBUPHINE SYRINGE 5 MG/0.5 ML
10.0000 mg | INJECTION | INTRAMUSCULAR | Status: DC | PRN
  Administered 2012-12-10 (×3): 10 mg via INTRAVENOUS
  Filled 2012-12-09 (×4): qty 1

## 2012-12-09 MED ORDER — LACTATED RINGERS IV SOLN
500.0000 mL | INTRAVENOUS | Status: DC | PRN
  Administered 2012-12-10: 500 mL via INTRAVENOUS
  Administered 2012-12-11: 300 mL via INTRAVENOUS
  Administered 2012-12-11: 1000 mL via INTRAVENOUS

## 2012-12-09 NOTE — Progress Notes (Signed)
Attempted to start IV x1 stick in right wrist with initial success, vein blew once attempted to draw blood.

## 2012-12-09 NOTE — H&P (Signed)
Jackie Brooks is a 27 y.o. female presenting for IOL for PD at 41wks. Pt denies any ctx, VB or LOF, GFM.   Pregnancy course: Pt began [facility] clinic at 19wks, then tx care to [facility] at 23wks Anatomy US done at 26wks was normal except for limited spinal views, f/u US at 30wks completed views of lower spine Quad screen at 19wks WNL Pt rcv'd weekly BPP in the last 5 weeks due to persistent c/o decreased fetal movements, all testing was WNL Growth [place] at 38wks EFW 52% Pt had routine PNC otherwise  Maternal Medical History:  Reason for admission: IOL for PD   Contractions: Denies ctx   Fetal activity: Perceived fetal activity is normal.   Last perceived fetal movement was within the past hour.    Prenatal complications: no prenatal complications Prenatal Complications - Diabetes: none.    OB History   Grav Para Term Preterm Abortions TAB SAB Ect Mult Living   1 0 0 0 0 0 0 0 0 0      Past Medical History  Diagnosis Date  . Headache(784.0)   . Medical history non-contributory   . Infection     UTI   Past Surgical History  Procedure Laterality Date  . No past surgeries     Family History: family history includes Cancer in her paternal aunt and paternal grandmother; Hypertension in her mother. Social History:  reports that she has been passively smoking.  She has never used smokeless tobacco. She reports that she does not drink alcohol or use illicit drugs.   Prenatal Transfer Tool  Maternal Diabetes: No Genetic Screening: Normal Maternal Ultrasounds/Referrals: Normal Fetal Ultrasounds or other Referrals:  None Maternal Substance Abuse:  No Significant Maternal Medications:  None Significant Maternal Lab Results:  Lab values include: Group B Strep negative Other Comments:  None  Review of Systems  All other systems reviewed and are negative.    Dilation: Closed Effacement (%): 50 Station: -2 Exam by:: S. Analyah Brooks, CNM Blood pressure 122/85, pulse 120, temperature 98.1  F (36.7 C), temperature source Oral, resp. rate 20, height 4\' 11"  (1.499 m), weight 174 lb 2 oz (78.983 kg). Maternal Exam:  Uterine Assessment: Contraction strength is mild.  Contraction duration is 40 seconds. Contraction frequency is irregular.   Abdomen: Patient reports no abdominal tenderness. Fundal height is aga.   Estimated fetal weight is 7#.   Fetal presentation: vertex  Introitus: Normal vulva. Normal vagina.  Pelvis: adequate for delivery.   Cervix: Cervix evaluated by digital exam.     Fetal Exam Fetal Monitor Review: Mode: ultrasound.   Baseline rate: 130.  Variability: moderate (6-25 bpm).   Pattern: accelerations present and no decelerations.    Fetal State Assessment: Category I - tracings are normal.     Physical Exam  Nursing note and vitals reviewed. Constitutional: She is oriented to person, place, and time. She appears well-developed and well-nourished.  HENT:  Head: Normocephalic.  Eyes: Pupils are equal, round, and reactive to light.  Neck: Normal range of motion.  Cardiovascular: Normal rate, regular rhythm and normal heart sounds.   Respiratory: Effort normal and breath sounds normal.  GI: Soft. Bowel sounds are normal.  Genitourinary: Vagina normal.  Musculoskeletal: Normal range of motion.  Neurological: She is alert and oriented to person, place, and time. She has normal reflexes.  Skin: Skin is warm and dry.  Psychiatric: She has a normal mood and affect. Her behavior is normal.    Prenatal labs: ABO,  Rh: B/POS/-- (05/19 1412) Antibody: NEG (05/19 1412) Rubella: 1.52 (05/19 1412) RPR: NON REAC (05/19 1412)  HBsAg: NEGATIVE (05/19 1412)  HIV: NON REACTIVE (05/19 1412)  GBS: Negative (09/11 0000)   Assessment/Plan: IUP at 41wks GBS neg FHR reassuring Unfavorable cervix  Admit to b.s. Per c/w Dr Dion Body Routine L&D orders cytotec q4h PV overnight, pitocin in the AM ambien for sleep Pain meds PRN    Jackie Brooks  M 12/09/2012, 10:13 PM

## 2012-12-10 ENCOUNTER — Encounter (HOSPITAL_COMMUNITY): Payer: Medicaid Other | Admitting: Anesthesiology

## 2012-12-10 ENCOUNTER — Inpatient Hospital Stay (HOSPITAL_COMMUNITY): Payer: Medicaid Other | Admitting: Anesthesiology

## 2012-12-10 LAB — TYPE AND SCREEN: Antibody Screen: NEGATIVE

## 2012-12-10 LAB — ABO/RH: ABO/RH(D): B POS

## 2012-12-10 MED ORDER — LACTATED RINGERS IV SOLN: 500.0000 mL | Freq: Once | INTRAVENOUS | Status: DC

## 2012-12-10 MED ORDER — TERBUTALINE SULFATE 1 MG/ML IJ SOLN: 0.2500 mg | Freq: Once | INTRAMUSCULAR | Status: AC | PRN

## 2012-12-10 MED ORDER — EPHEDRINE 5 MG/ML INJ: 10.0000 mg | INTRAVENOUS | Status: DC | PRN

## 2012-12-10 MED ORDER — OXYTOCIN 40 UNITS IN LACTATED RINGERS INFUSION - SIMPLE MED
1.0000 m[IU]/min | INTRAVENOUS | Status: DC
  Administered 2012-12-10: 1 m[IU]/min via INTRAVENOUS

## 2012-12-10 MED ORDER — PHENYLEPHRINE 40 MCG/ML (10ML) SYRINGE FOR IV PUSH (FOR BLOOD PRESSURE SUPPORT): 80.0000 ug | PREFILLED_SYRINGE | INTRAVENOUS | Status: DC | PRN

## 2012-12-10 MED ORDER — PHENYLEPHRINE 40 MCG/ML (10ML) SYRINGE FOR IV PUSH (FOR BLOOD PRESSURE SUPPORT)
80.0000 ug | PREFILLED_SYRINGE | INTRAVENOUS | Status: DC | PRN
  Filled 2012-12-10: qty 5

## 2012-12-10 MED ORDER — LIDOCAINE HCL (PF) 1 % IJ SOLN
INTRAMUSCULAR | Status: DC | PRN
  Administered 2012-12-10 (×2): 5 mL

## 2012-12-10 MED ORDER — DIPHENHYDRAMINE HCL 50 MG/ML IJ SOLN: 12.5000 mg | INTRAMUSCULAR | Status: DC | PRN

## 2012-12-10 MED ORDER — FENTANYL 2.5 MCG/ML BUPIVACAINE 1/10 % EPIDURAL INFUSION (WH - ANES)
14.0000 mL/h | INTRAMUSCULAR | Status: DC | PRN
  Administered 2012-12-10 – 2012-12-11 (×3): 14 mL/h via EPIDURAL
  Filled 2012-12-10 (×3): qty 125

## 2012-12-10 MED ORDER — EPHEDRINE 5 MG/ML INJ
10.0000 mg | INTRAVENOUS | Status: DC | PRN
  Filled 2012-12-10: qty 4

## 2012-12-10 NOTE — Progress Notes (Signed)
Attempted to do VE prior to med administration, unable to reach cervix

## 2012-12-10 NOTE — Progress Notes (Signed)
Jackie Brooks is a 27 y.o. G1P0000 at [redacted]w[redacted]d  admitted for induction of labor due to Post dates.   Subjective: Reports feeling some cramping and increased contraction intensity.  Has had another dose of pain med with slight relief.  No ROM or bldg.    Objective: BP 100/69  Pulse 107  Temp(Src) 97.9 F (36.6 C) (Oral)  Resp 18  Ht 4\' 11"  (1.499 m)  Wt 78.983 kg (174 lb 2 oz)  BMI 35.15 kg/m2  FHT:  FHR: 145 bpm, variability: moderate,  accelerations:  Present,  decelerations:  Absent UC:   1.5-6 minutes.  Pitocin at 31mu/min SVE:  Deferred at present.  Labs: Lab Results  Component Value Date   WBC 9.4 12/09/2012   HGB 11.5* 12/09/2012   HCT 34.8* 12/09/2012   MCV 76.7* 12/09/2012   PLT 336 12/09/2012    Assessment / Plan: Induction of labor due to post dates at 41w 0d  Labor: Latent labor Preeclampsia:  no signs or symptoms of toxicity Fetal Wellbeing:  Category I Pain Control:  IV pain med I/D:  GBS neg/Afebrile Anticipated MOD:  NSVD  Continue to titrate pitocin to achieve adequate contraction pattern.  Keidy Thurgood O. 12/10/2012, 1:19 PM

## 2012-12-10 NOTE — Anesthesia Preprocedure Evaluation (Signed)

## 2012-12-10 NOTE — Anesthesia Procedure Notes (Signed)
Epidural Patient location during procedure: OB Start time: 12/10/2012 3:14 PM  Staffing Anesthesiologist: Angus Seller., Harrell Gave. Performed by: anesthesiologist   Preanesthetic Checklist Completed: patient identified, site marked, surgical consent, pre-op evaluation, timeout performed, IV checked, risks and benefits discussed and monitors and equipment checked  Epidural Patient position: sitting Prep: site prepped and draped and DuraPrep Patient monitoring: continuous pulse ox and blood pressure Approach: midline Injection technique: LOR air and LOR saline  Needle:  Needle type: Tuohy  Needle gauge: 17 G Needle length: 9 cm and 9 Needle insertion depth: 5 cm cm Catheter type: closed end flexible Catheter size: 19 Gauge Catheter at skin depth: 10 cm Test dose: negative  Assessment Events: blood not aspirated, injection not painful, no injection resistance, negative IV test and no paresthesia  Additional Notes Patient identified.  Risk benefits discussed including failed block, incomplete pain control, headache, nerve damage, paralysis, blood pressure changes, nausea, vomiting, reactions to medication both toxic or allergic, and postpartum back pain.  Patient expressed understanding and wished to proceed.  All questions were answered.  Sterile technique used throughout procedure and epidural site dressed with sterile barrier dressing. No paresthesia or other complications noted.The patient did not experience any signs of intravascular injection such as tinnitus or metallic taste in mouth nor signs of intrathecal spread such as rapid motor block. Please see nursing notes for vital signs.

## 2012-12-11 ENCOUNTER — Encounter (HOSPITAL_COMMUNITY): Payer: Self-pay

## 2012-12-11 ENCOUNTER — Encounter (HOSPITAL_COMMUNITY)
Admission: RE | Disposition: A | Discharge status: Home / Self Care | Payer: Self-pay | Source: Ambulatory Visit | Attending: Obstetrics and Gynecology

## 2012-12-11 LAB — CBC WITH DIFFERENTIAL/PLATELET
Basophils Relative: 0 % (ref 0–1)
Eosinophils Relative: 0 % (ref 0–5)
HCT: 28.7 % — ABNORMAL LOW (ref 36.0–46.0)
Hemoglobin: 9.7 g/dL — ABNORMAL LOW (ref 12.0–15.0)
Lymphocytes Relative: 5 % — ABNORMAL LOW (ref 12–46)
MCHC: 33.8 g/dL (ref 30.0–36.0)
MCV: 75.1 fL — ABNORMAL LOW (ref 78.0–100.0)
Monocytes Absolute: 1.4 10*3/uL — ABNORMAL HIGH (ref 0.1–1.0)
Monocytes Relative: 7 % (ref 3–12)
Neutro Abs: 17.8 10*3/uL — ABNORMAL HIGH (ref 1.7–7.7)
RBC: 3.82 MIL/uL — ABNORMAL LOW (ref 3.87–5.11)
RDW: 15.9 % — ABNORMAL HIGH (ref 11.5–15.5)

## 2012-12-11 SURGERY — Surgical Case
Anesthesia: Epidural | Site: Abdomen | Wound class: Clean Contaminated

## 2012-12-11 MED ORDER — SCOPOLAMINE 1 MG/3DAYS TD PT72
MEDICATED_PATCH | TRANSDERMAL | Status: AC
  Filled 2012-12-11: qty 1

## 2012-12-11 MED ORDER — OXYTOCIN 10 UNIT/ML IJ SOLN
INTRAMUSCULAR | Status: AC
  Filled 2012-12-11: qty 4

## 2012-12-11 MED ORDER — OXYCODONE-ACETAMINOPHEN 5-325 MG PO TABS: 1.0000 | ORAL_TABLET | ORAL | Status: DC | PRN

## 2012-12-11 MED ORDER — KETOROLAC TROMETHAMINE 30 MG/ML IJ SOLN: 30.0000 mg | Freq: Four times a day (QID) | INTRAMUSCULAR | Status: AC | PRN

## 2012-12-11 MED ORDER — METHYLERGONOVINE MALEATE 0.2 MG PO TABS: 0.2000 mg | ORAL_TABLET | ORAL | Status: DC | PRN

## 2012-12-11 MED ORDER — ONDANSETRON HCL 4 MG/2ML IJ SOLN
INTRAMUSCULAR | Status: DC | PRN
  Administered 2012-12-11: 4 mg via INTRAMUSCULAR

## 2012-12-11 MED ORDER — ACETAMINOPHEN 650 MG RE SUPP
650.0000 mg | RECTAL | Status: DC | PRN
  Administered 2012-12-11: 650 mg via RECTAL
  Filled 2012-12-11 (×2): qty 1

## 2012-12-11 MED ORDER — ACETAMINOPHEN 160 MG/5ML PO SOLN
650.0000 mg | ORAL | Status: DC | PRN
  Administered 2012-12-11: 650 mg via ORAL
  Filled 2012-12-11: qty 20.3

## 2012-12-11 MED ORDER — WITCH HAZEL-GLYCERIN EX PADS: 1.0000 "application " | MEDICATED_PAD | CUTANEOUS | Status: DC | PRN

## 2012-12-11 MED ORDER — MORPHINE SULFATE 0.5 MG/ML IJ SOLN
INTRAMUSCULAR | Status: AC
  Filled 2012-12-11: qty 10

## 2012-12-11 MED ORDER — GENTAMICIN SULFATE 40 MG/ML IJ SOLN
400.0000 mg | Freq: Once | INTRAVENOUS | Status: AC
  Administered 2012-12-11: 400 mg via INTRAVENOUS
  Filled 2012-12-11: qty 10

## 2012-12-11 MED ORDER — TETANUS-DIPHTH-ACELL PERTUSSIS 5-2.5-18.5 LF-MCG/0.5 IM SUSP
0.5000 mL | Freq: Once | INTRAMUSCULAR | Status: DC
  Filled 2012-12-11: qty 0.5

## 2012-12-11 MED ORDER — LANOLIN HYDROUS EX OINT: 1.0000 "application " | TOPICAL_OINTMENT | CUTANEOUS | Status: DC | PRN

## 2012-12-11 MED ORDER — CEFAZOLIN SODIUM-DEXTROSE 2-3 GM-% IV SOLR
INTRAVENOUS | Status: DC | PRN
  Administered 2012-12-11: 2 g via INTRAVENOUS

## 2012-12-11 MED ORDER — PHENYLEPHRINE HCL 10 MG/ML IJ SOLN
INTRAMUSCULAR | Status: DC | PRN
  Administered 2012-12-11: 80 ug via INTRAVENOUS
  Administered 2012-12-11 (×2): 40 ug via INTRAVENOUS
  Administered 2012-12-11 (×2): 80 ug via INTRAVENOUS
  Administered 2012-12-11 (×2): 40 ug via INTRAVENOUS
  Administered 2012-12-11: 80 ug via INTRAVENOUS
  Administered 2012-12-11 (×2): 40 ug via INTRAVENOUS

## 2012-12-11 MED ORDER — PRENATAL MULTIVITAMIN CH
1.0000 | ORAL_TABLET | Freq: Every day | ORAL | Status: DC
  Filled 2012-12-11: qty 1

## 2012-12-11 MED ORDER — IBUPROFEN 100 MG/5ML PO SUSP
600.0000 mg | Freq: Four times a day (QID) | ORAL | Status: DC
  Administered 2012-12-11 – 2012-12-13 (×7): 600 mg via ORAL
  Filled 2012-12-11 (×12): qty 30

## 2012-12-11 MED ORDER — LACTATED RINGERS IV BOLUS (SEPSIS)
500.0000 mL | Freq: Once | INTRAVENOUS | Status: AC
  Administered 2012-12-11: 500 mL via INTRAVENOUS

## 2012-12-11 MED ORDER — ONDANSETRON HCL 4 MG/2ML IJ SOLN
INTRAMUSCULAR | Status: AC
  Filled 2012-12-11: qty 2

## 2012-12-11 MED ORDER — SIMETHICONE 80 MG PO CHEW
80.0000 mg | CHEWABLE_TABLET | Freq: Three times a day (TID) | ORAL | Status: DC
  Administered 2012-12-11 – 2012-12-13 (×4): 80 mg via ORAL
  Filled 2012-12-11 (×4): qty 1

## 2012-12-11 MED ORDER — MEPERIDINE HCL 25 MG/ML IJ SOLN: 6.2500 mg | INTRAMUSCULAR | Status: DC | PRN

## 2012-12-11 MED ORDER — NALBUPHINE HCL 10 MG/ML IJ SOLN
5.0000 mg | INTRAMUSCULAR | Status: DC | PRN
  Filled 2012-12-11: qty 1

## 2012-12-11 MED ORDER — SCOPOLAMINE 1 MG/3DAYS TD PT72
1.0000 | MEDICATED_PATCH | Freq: Once | TRANSDERMAL | Status: DC
  Administered 2012-12-11: 1.5 mg via TRANSDERMAL

## 2012-12-11 MED ORDER — MIDAZOLAM HCL 2 MG/2ML IJ SOLN: 0.5000 mg | Freq: Once | INTRAMUSCULAR | Status: DC | PRN

## 2012-12-11 MED ORDER — FENTANYL CITRATE 0.05 MG/ML IJ SOLN: 25.0000 ug | INTRAMUSCULAR | Status: DC | PRN

## 2012-12-11 MED ORDER — METHYLERGONOVINE MALEATE 0.2 MG/ML IJ SOLN
INTRAMUSCULAR | Status: AC
  Filled 2012-12-11: qty 1

## 2012-12-11 MED ORDER — SENNOSIDES-DOCUSATE SODIUM 8.6-50 MG PO TABS
2.0000 | ORAL_TABLET | ORAL | Status: DC
  Administered 2012-12-12 (×2): 2 via ORAL
  Filled 2012-12-11: qty 2

## 2012-12-11 MED ORDER — LACTATED RINGERS IV SOLN
INTRAVENOUS | Status: DC
  Administered 2012-12-11 – 2012-12-13 (×6): via INTRAVENOUS

## 2012-12-11 MED ORDER — SIMETHICONE 80 MG PO CHEW
80.0000 mg | CHEWABLE_TABLET | ORAL | Status: DC
  Administered 2012-12-12 (×2): 80 mg via ORAL
  Filled 2012-12-11: qty 1

## 2012-12-11 MED ORDER — LACTATED RINGERS IV BOLUS (SEPSIS): 500.0000 mL | Freq: Once | INTRAVENOUS | Status: DC

## 2012-12-11 MED ORDER — NALOXONE HCL 0.4 MG/ML IJ SOLN: 0.4000 mg | INTRAMUSCULAR | Status: DC | PRN

## 2012-12-11 MED ORDER — FLUCONAZOLE 150 MG PO TABS
150.0000 mg | ORAL_TABLET | Freq: Once | ORAL | Status: AC
  Administered 2012-12-11: 150 mg via ORAL
  Filled 2012-12-11: qty 1

## 2012-12-11 MED ORDER — NALOXONE HCL 1 MG/ML IJ SOLN
1.0000 ug/kg/h | INTRAVENOUS | Status: DC | PRN
  Filled 2012-12-11: qty 2

## 2012-12-11 MED ORDER — DIPHENHYDRAMINE HCL 50 MG/ML IJ SOLN: 12.5000 mg | INTRAMUSCULAR | Status: DC | PRN

## 2012-12-11 MED ORDER — OXYTOCIN 10 UNIT/ML IJ SOLN
40.0000 [IU] | INTRAVENOUS | Status: DC | PRN
  Administered 2012-12-11: 40 [IU] via INTRAVENOUS

## 2012-12-11 MED ORDER — IBUPROFEN 600 MG PO TABS: 600.0000 mg | ORAL_TABLET | Freq: Four times a day (QID) | ORAL | Status: DC

## 2012-12-11 MED ORDER — MORPHINE SULFATE (PF) 0.5 MG/ML IJ SOLN
INTRAMUSCULAR | Status: DC | PRN
  Administered 2012-12-11: 1 mg via INTRAVENOUS

## 2012-12-11 MED ORDER — PHENYLEPHRINE 40 MCG/ML (10ML) SYRINGE FOR IV PUSH (FOR BLOOD PRESSURE SUPPORT)
PREFILLED_SYRINGE | INTRAVENOUS | Status: AC
  Filled 2012-12-11: qty 5

## 2012-12-11 MED ORDER — PROMETHAZINE HCL 25 MG/ML IJ SOLN: 6.2500 mg | INTRAMUSCULAR | Status: DC | PRN

## 2012-12-11 MED ORDER — CLINDAMYCIN PHOSPHATE 900 MG/50ML IV SOLN
900.0000 mg | Freq: Three times a day (TID) | INTRAVENOUS | Status: AC
  Administered 2012-12-11 – 2012-12-12 (×3): 900 mg via INTRAVENOUS
  Filled 2012-12-11 (×3): qty 50

## 2012-12-11 MED ORDER — DIPHENHYDRAMINE HCL 25 MG PO CAPS: 25.0000 mg | ORAL_CAPSULE | ORAL | Status: DC | PRN

## 2012-12-11 MED ORDER — SODIUM BICARBONATE 8.4 % IV SOLN
INTRAVENOUS | Status: DC | PRN
  Administered 2012-12-11: 3 mL via EPIDURAL
  Administered 2012-12-11: 10 mL via EPIDURAL
  Administered 2012-12-11: 5 mL via EPIDURAL

## 2012-12-11 MED ORDER — METOCLOPRAMIDE HCL 5 MG/ML IJ SOLN: 10.0000 mg | Freq: Three times a day (TID) | INTRAMUSCULAR | Status: DC | PRN

## 2012-12-11 MED ORDER — MORPHINE SULFATE (PF) 0.5 MG/ML IJ SOLN
INTRAMUSCULAR | Status: DC | PRN
  Administered 2012-12-11: 4 mg via EPIDURAL

## 2012-12-11 MED ORDER — DIPHENHYDRAMINE HCL 25 MG PO CAPS: 25.0000 mg | ORAL_CAPSULE | Freq: Four times a day (QID) | ORAL | Status: DC | PRN

## 2012-12-11 MED ORDER — ONDANSETRON HCL 4 MG PO TABS: 4.0000 mg | ORAL_TABLET | ORAL | Status: DC | PRN

## 2012-12-11 MED ORDER — LIDOCAINE-EPINEPHRINE (PF) 2 %-1:200000 IJ SOLN
INTRAMUSCULAR | Status: AC
  Filled 2012-12-11: qty 20

## 2012-12-11 MED ORDER — METHYLERGONOVINE MALEATE 0.2 MG/ML IJ SOLN
INTRAMUSCULAR | Status: DC | PRN
  Administered 2012-12-11: 0.2 mg via INTRAMUSCULAR

## 2012-12-11 MED ORDER — ONDANSETRON HCL 4 MG/2ML IJ SOLN: 4.0000 mg | Freq: Three times a day (TID) | INTRAMUSCULAR | Status: DC | PRN

## 2012-12-11 MED ORDER — DIPHENHYDRAMINE HCL 50 MG/ML IJ SOLN: 25.0000 mg | INTRAMUSCULAR | Status: DC | PRN

## 2012-12-11 MED ORDER — SODIUM BICARBONATE 8.4 % IV SOLN
INTRAVENOUS | Status: AC
  Filled 2012-12-11: qty 50

## 2012-12-11 MED ORDER — SODIUM CHLORIDE 0.9 % IJ SOLN: 3.0000 mL | INTRAMUSCULAR | Status: DC | PRN

## 2012-12-11 MED ORDER — DIBUCAINE 1 % RE OINT: 1.0000 "application " | TOPICAL_OINTMENT | RECTAL | Status: DC | PRN

## 2012-12-11 MED ORDER — SODIUM CHLORIDE 0.9 % IV SOLN
3.0000 g | Freq: Four times a day (QID) | INTRAVENOUS | Status: DC
  Administered 2012-12-11: 3 g via INTRAVENOUS
  Filled 2012-12-11 (×2): qty 3

## 2012-12-11 MED ORDER — METHYLERGONOVINE MALEATE 0.2 MG/ML IJ SOLN: 0.2000 mg | INTRAMUSCULAR | Status: DC | PRN

## 2012-12-11 MED ORDER — SIMETHICONE 80 MG PO CHEW: 80.0000 mg | CHEWABLE_TABLET | ORAL | Status: DC | PRN

## 2012-12-11 MED ORDER — OXYTOCIN 10 UNIT/ML IJ SOLN
INTRAMUSCULAR | Status: DC | PRN
  Administered 2012-12-11: 10 [IU] via INTRAMUSCULAR

## 2012-12-11 MED ORDER — MEASLES, MUMPS & RUBELLA VAC ~~LOC~~ INJ: 0.5000 mL | INJECTION | Freq: Once | SUBCUTANEOUS | Status: DC

## 2012-12-11 MED ORDER — ZOLPIDEM TARTRATE 5 MG PO TABS: 5.0000 mg | ORAL_TABLET | Freq: Every evening | ORAL | Status: DC | PRN

## 2012-12-11 MED ORDER — MENTHOL 3 MG MT LOZG: 1.0000 | LOZENGE | OROMUCOSAL | Status: DC | PRN

## 2012-12-11 MED ORDER — OXYTOCIN 40 UNITS IN LACTATED RINGERS INFUSION - SIMPLE MED: 62.5000 mL/h | INTRAVENOUS | Status: AC

## 2012-12-11 MED ORDER — SODIUM CHLORIDE 0.9 % IV SOLN
1.0000 g | Freq: Four times a day (QID) | INTRAVENOUS | Status: AC
  Administered 2012-12-11 – 2012-12-12 (×4): 1 g via INTRAVENOUS
  Filled 2012-12-11 (×4): qty 1000

## 2012-12-11 MED ORDER — LACTATED RINGERS IV SOLN
INTRAVENOUS | Status: DC | PRN
  Administered 2012-12-11: 10:00:00 via INTRAVENOUS

## 2012-12-11 MED ORDER — ONDANSETRON HCL 4 MG/2ML IJ SOLN: 4.0000 mg | INTRAMUSCULAR | Status: DC | PRN

## 2012-12-11 MED ORDER — PHENYLEPHRINE 40 MCG/ML (10ML) SYRINGE FOR IV PUSH (FOR BLOOD PRESSURE SUPPORT)
PREFILLED_SYRINGE | INTRAVENOUS | Status: AC
  Filled 2012-12-11: qty 10

## 2012-12-11 SURGICAL SUPPLY — 51 items
ADH SKN CLS APL DERMABOND .7 (GAUZE/BANDAGES/DRESSINGS)
APL SKNCLS STERI-STRIP NONHPOA (GAUZE/BANDAGES/DRESSINGS) ×1
BARRIER ADHS 3X4 INTERCEED (GAUZE/BANDAGES/DRESSINGS) ×2 IMPLANT
BENZOIN TINCTURE PRP APPL 2/3 (GAUZE/BANDAGES/DRESSINGS) ×1 IMPLANT
BRR ADH 4X3 ABS CNTRL BYND (GAUZE/BANDAGES/DRESSINGS) ×1
CLAMP CORD UMBIL (MISCELLANEOUS) ×1 IMPLANT
CLOTH BEACON ORANGE TIMEOUT ST (SAFETY) ×2 IMPLANT
COVER LIGHT HANDLE  1/PK (MISCELLANEOUS) ×1
COVER LIGHT HANDLE 1/PK (MISCELLANEOUS) IMPLANT
DERMABOND ADVANCED (GAUZE/BANDAGES/DRESSINGS)
DERMABOND ADVANCED .7 DNX12 (GAUZE/BANDAGES/DRESSINGS) IMPLANT
DRAPE LG THREE QUARTER DISP (DRAPES) ×4 IMPLANT
DRSG OPSITE POSTOP 4X10 (GAUZE/BANDAGES/DRESSINGS) ×2 IMPLANT
DURAPREP 26ML APPLICATOR (WOUND CARE) ×2 IMPLANT
ELECT REM PT RETURN 9FT ADLT (ELECTROSURGICAL) ×2
ELECTRODE REM PT RTRN 9FT ADLT (ELECTROSURGICAL) ×1 IMPLANT
EXTRACTOR VACUUM BELL STYLE (SUCTIONS) IMPLANT
GLOVE BIO SURGEON STRL SZ7 (GLOVE) ×2 IMPLANT
GLOVE BIOGEL PI IND STRL 6.5 (GLOVE) IMPLANT
GLOVE BIOGEL PI IND STRL 7.0 (GLOVE) ×1 IMPLANT
GLOVE BIOGEL PI INDICATOR 6.5 (GLOVE) ×2
GLOVE BIOGEL PI INDICATOR 7.0 (GLOVE) ×1
GLOVE ECLIPSE 7.0 STRL STRAW (GLOVE) ×1 IMPLANT
GLOVE SURG SS PI 6.5 STRL IVOR (GLOVE) ×2 IMPLANT
GOWN PREVENTION PLUS XLARGE (GOWN DISPOSABLE) ×5 IMPLANT
GOWN STRL REIN XL XLG (GOWN DISPOSABLE) ×4 IMPLANT
KIT ABG SYR 3ML LUER SLIP (SYRINGE) IMPLANT
NDL HYPO 25X1 1.5 SAFETY (NEEDLE) IMPLANT
NDL HYPO 25X5/8 SAFETYGLIDE (NEEDLE) IMPLANT
NDL SAFETY ECLIPSE 18X1.5 (NEEDLE) IMPLANT
NEEDLE HYPO 18GX1.5 SHARP (NEEDLE) ×2
NEEDLE HYPO 25X1 1.5 SAFETY (NEEDLE) ×2 IMPLANT
NEEDLE HYPO 25X5/8 SAFETYGLIDE (NEEDLE) IMPLANT
NS IRRIG 1000ML POUR BTL (IV SOLUTION) ×2 IMPLANT
PACK C SECTION WH (CUSTOM PROCEDURE TRAY) ×2 IMPLANT
PAD ABD 7.5X8 STRL (GAUZE/BANDAGES/DRESSINGS) ×1 IMPLANT
PAD OB MATERNITY 4.3X12.25 (PERSONAL CARE ITEMS) ×2 IMPLANT
RTRCTR C-SECT PINK 25CM LRG (MISCELLANEOUS) ×2 IMPLANT
STRIP CLOSURE SKIN 1/2X4 (GAUZE/BANDAGES/DRESSINGS) ×1 IMPLANT
SUT CHROMIC 0 CTX 36 (SUTURE) ×9 IMPLANT
SUT PLAIN 2 0 (SUTURE)
SUT PLAIN 2 0 XLH (SUTURE) ×2 IMPLANT
SUT PLAIN ABS 2-0 54XMFL TIE (SUTURE) IMPLANT
SUT VIC AB 0 CT1 27 (SUTURE) ×4
SUT VIC AB 0 CT1 27XBRD ANBCTR (SUTURE) ×2 IMPLANT
SUT VIC AB 4-0 KS 27 (SUTURE) ×2 IMPLANT
SYRINGE 10CC LL (SYRINGE) ×1 IMPLANT
TAPE CLOTH SURG 4X10 WHT LF (GAUZE/BANDAGES/DRESSINGS) ×1 IMPLANT
TOWEL OR 17X24 6PK STRL BLUE (TOWEL DISPOSABLE) ×2 IMPLANT
TRAY FOLEY CATH 14FR (SET/KITS/TRAYS/PACK) IMPLANT
WATER STERILE IRR 1000ML POUR (IV SOLUTION) ×2 IMPLANT

## 2012-12-11 NOTE — Progress Notes (Signed)
Patient ID: Jackie Brooks, female   DOB: Aug 27, 1985, 27 y.o.   MRN: [id number]  Pt seen and examined. No complaints.  No questions about cesarean section. Temp 102.1 right now s/p Tylenol second dose. Gen:  NAD CV:  RRR Lungs:  CTA bilaterally Abdomen: moderate contraction palpated, ~ 7lbs Ext:  SCDs on WBC 9.4 on admission 9/17 A/p:  Postdates pregnancy, Chorioamnionitis, Febrile, Failure to progress. On Unasyn, added Gentamycin.  Will get Ancef in OR. Prepare for Uterine atony.  IM Pitocin in the OR. Discussed R/B/A of caesarean section.  Informed of increased risk of bleeding.  Pt verbalized understanding.

## 2012-12-11 NOTE — Progress Notes (Signed)
POC for C/S initiated.  OR notified per Dr Dion Body.  S Lillard at bedside for consent and to answer questions for family

## 2012-12-11 NOTE — Op Note (Signed)
Jackie Brooks, Jackie Brooks                ACCOUNT NO.:  [id number]  MEDICAL RECORD NO.:  [id number]  LOCATION:  9372                          FACILITY:  [facility]  PHYSICIAN:  Pieter Partridge, MD   DATE OF BIRTH:  09/05/1985  DATE OF PROCEDURE:  12/11/2012 DATE OF DISCHARGE:                              OPERATIVE REPORT   PREOPERATIVE DIAGNOSES:  Pregnancy at 41-2/7th weeks, failure to progress, chorioamnionitis.  POSTOPERATIVE DIAGNOSES:  Pregnancy at 41-2/7th weeks, failure to progress, chorioamnionitis, uterine atony.  PROCEDURE:  Primary low transverse cesarean section via 2-layered closure.  SURGEON:  Shela Nevin. Dion Body, MD  ASSISTANT:  Annitta Needs, certified nurse midwife.  ANESTHESIA:  Epidural.  EBL:  1000.  URINE OUTPUT:  650 clear urine and 1000.  BLOOD ADMINISTERED:  None.  DRAINS:  Urine catheter Foley local none.  SPECIMENS:  Placenta to Pathology.  To PACU hemodynamically stable.  COMPLICATIONS:  None.  FINDINGS:  Viable female infant in transverse and was not in the pelvis. Normal-appearing membranes and placenta.  Normal uterus.  Needed Methergine IM and Pitocin inserted into the uterus for atony.  Baby in the vertex transverse.  Apgars 9 and 9.  Weight pending.  PROCEDURE IN DETAIL:  The patient was identified in the labor and delivery room.  She was then taken to the C-section suite room 9.  Her epidural was optimized.  In the dorsal supine position, she was then prepped and draped in the normal sterile fashion once she was adequate. She was tested with that Allis test to see if she was comfortable and the anesthesia appeared to be very adequate.  The abdomen was marked and a Pfannenstiel skin incision was made 2 cm above the symphysis pubis and carried down to the underlying layer of fascia with the Bovie.  Of note, the subcutaneous fat was minimal. Fascia was incised in the midline with the Bovie and extended laterally. Hemostasis was needed for  bleeding at the muscles.  Kocher clamps were used to grasp the fascia and the rectus muscles were dissected off sharply.  The same was done below.  The muscles were separated at the midline and the peritoneum was identified and entered sharply.  The peritoneum was stressed and Allis retractor was then placed.  Lower uterine segment appeared to be normal.  Bladder flap was made with the Metzenbaum in the serosa.  A transverse incision was then made on the lower uterine segment of the uterus and extended with the bandage scissors.  When the uterus was opened, there was an area visible at the incision.  The head was brought through the incision easily and delivered without issue.  Baby was LOT.  Nose and mouth were suctioned. No nuchal cord noted.  Shoulders delivered easily.  Cord clamped x2 and cut.  Baby handed to the awaiting NICU staff.  Cord blood obtained.  Placenta removed.  The uterus was cleared of all clots and debris.  Ring forceps were used to grasp the apex of the incision in the lower uterine segment.  Once the uterus was cleared of all clots and debris, an 0 chromic was used to close the hysterotomy incision in a running locked  fashion.  There was a significant bleed on the left apex which was left with closure of the hysterotomy incision. A 2nd layer of the same suture was used for imbrication.  Two figure-of- eights were needed to reapproximate to get hemostasis at the midline. There was 1 lap that was put in the belly due to herniating colon and that was removed prior to irrigation.  The gutters were cleared copiously until clear.  Fascia was then reapproximated with 0 Vicryl in a continuous running fashion after the fascia and rectus muscles were inspected carefully. Hemostasis achieved with the Bovie as needed.  Once the fascial incision was closed, the subcutaneous base was not very deep at all, so the skin was reapproximated with 4-0 Vicryl on a Keith needle, and  pressure dressing and Steri-Strips and benzoin were to be applied.  The patient tolerated the procedure well.  All instrument, sponge, and needle counts were correct x3.  She had SCDs on throughout the case. She did receive units in the labor and delivery.  She got an additional dose of Ancef and gentamicin prior to the procedure.  All instrument, sponge, and needle counts were correct x3.  Time-out was performed prior to the procedure.     Pieter Partridge, MD     EBV/MEDQ  D:  12/11/2012  T:  12/11/2012  Job:  [id number]

## 2012-12-11 NOTE — Progress Notes (Addendum)
Patient ID: Jackie Brooks, female   DOB: 1985/09/01, 27 y.o.   MRN: [id number] Jackie Brooks is a 27 y.o. G1P0000 at [redacted]w[redacted]d admitted for PD IOL  Pt seen at approximately 0830,   Subjective: C/o feeling ctx on R side, currently laying on L side, anxious now at mention of needing c/section, family supportive at bs w lots of questions.   Objective: BP 140/63  Pulse 148  Temp(Src) 99 F (37.2 C) (Oral)  Resp 18  Ht 4\' 11"  (1.499 m)  Wt 174 lb 2 oz (78.983 kg)  BMI 35.15 kg/m2  SpO2 100%     FHT:  FHR: 170 bpm, variability: minimal ,  accelerations:  Present,  decelerations:  Absent, at times w mod variability  UC:   regular, every 2 minutes, low amplitude  SVE:   Dilation: 4 Effacement (%): 100 Station: -3 Exam by:: S Kalie Cabral CNM  Pitocin remains on at 17mu   Assessment / Plan: Protracted latent phase  Labor: protracted labor Preeclampsia:  no s/s Fetal Wellbeing:  Category II Pain Control:  Epidural Anticipated MOD:  c/s   PROM Protracted labor Chorioamnionitis  D/w pt recommendation of cesarean section due to inability to produce adequate ctx for cervical change, and persisting fever r/t chorioamnionitis.  TC to Dr Dion Body informed of pt status she will be en route to see pt and further discuss POC R/b/a were reviewed w pt including but not limited to risks of: 1 anesthesia complications 2. Bleeding 3. Infection 4. Damage to internal organs.  Pt and family were allowed to ask questions and concerns were addressed.    Jackie Brooks M 12/11/2012, 9:24 AM

## 2012-12-11 NOTE — Progress Notes (Signed)
15.1 ml of fentanyl epidural infusion wasted in sink by Hazel Sams RNC and Belenda Cruise RN. Welford Roche RN signed in as witness. Pharmacy notified.

## 2012-12-11 NOTE — Brief Op Note (Signed)
12/09/2012 - 12/11/2012  11:39 AM  PATIENT:  Jackie Brooks  27 y.o. female  PRE-OPERATIVE DIAGNOSIS:  Pregnancy at 41 2/7 weeks, Failure to progress, chorioamnioitis  POST-OPERATIVE DIAGNOSIS: Same, Uterine atony  PROCEDURE:  Procedure(s): CESAREAN SECTION (N/A), Primary LTCS, 2 layer closure  SURGEON:  Surgeon(s) and Role:    * Geryl Rankins, MD - Primary  PHYSICIAN ASSISTANT: None  ASSISTANTS: Gennette Pac, CNM   ANESTHESIA:   epidural  EBL:  Total I/O In: 1000 [I.V.:1000] Out: 1650 [Urine:650; Blood:1000]  BLOOD ADMINISTERED:none  DRAINS: Urinary Catheter (Foley)   LOCAL MEDICATIONS USED:  NONE  SPECIMEN:  Source of Specimen:  Placenta  DISPOSITION OF SPECIMEN:  PATHOLOGY  COUNTS:  YES  TOURNIQUET:  * No tourniquets in log *  DICTATION: .Other Dictation: Dictation Number [id number]  PLAN OF CARE: Transfer to AICU  PATIENT DISPOSITION:  PACU - hemodynamically stable.   Delay start of Pharmacological VTE agent (>24hrs) due to surgical blood loss or risk of bleeding: yes

## 2012-12-11 NOTE — Lactation Note (Addendum)
This note was copied from the chart of Jackie Darshana Spearing. Lactation Consultation Note    Initial consult with this mom and baby, now 3 hours post partum. The baby would not latch in th or. I assisted mom with both football and cross cradle. Mom afraid to hold baby too tight , so she "won't hurt the baby" I explained she would be supporting the baby's head, not hurting her. The baby was too sleepy to latch, so I and expressed 5 ml's of colostrum, and the baby quickly spoon fed and tolerated well. Lactation serivces and community services reviewed with mom. She knows to call for questions/concerns. I did try a 24 nipple shield, the baby sucked once, and went back to sleep  Patient Name: Jackie Brooks [id number] Date: 12/11/2012 Reason for consult: Initial assessment   Maternal Data Formula Feeding for Exclusion: Yes Reason for exclusion: Admission to Intensive Care Unit (ICU) post-partum Infant to breast within first hour of birth: Yes Has patient been taught Hand Expression?: Yes Does the patient have breastfeeding experience prior to this delivery?: No  Feeding Feeding Type: Breast Fed Length of feed: 0 min (attempt, several sucks)  LATCH Score/Interventions Latch: Too sleepy or reluctant, no latch achieved, no sucking elicited. Intervention(s): Skin to skin;Teach feeding cues;Waking techniques Intervention(s): Adjust position;Assist with latch;Breast massage;Breast compression  Audible Swallowing: None Intervention(s): Skin to skin  Type of Nipple: Flat (semi flat)  Comfort (Breast/Nipple): Soft / non-tender     Hold (Positioning): Assistance needed to correctly position infant at breast and maintain latch. Intervention(s): Breastfeeding basics reviewed;Support Pillows;Position options;Skin to skin  LATCH Score: 4  Lactation Tools Discussed/Used     Consult Status Consult Status: Follow-up Date: 12/12/12 Follow-up type: In-patient    Jackie Brooks 12/11/2012, 2:32 PM

## 2012-12-11 NOTE — Progress Notes (Signed)
Patient ID: Jackie Brooks, female   DOB: 07/04/1985, 27 y.o.   MRN: [id number]  Informed by Dr. Brayton Caves, anesthesiologist, that pt has developed some new neurologic symptoms.  AICU RN states she had tingling in her ankles and has now progressed to behind her thighs and into her hip and buttocks.  Dr. Jean Rosenthal recommended an MRI to r/o a epidural hematoma or abscess.  He recommended consulting with radiology on MRI order. Spoke with Dr. Bonnielee Haff, radiologist, and discussed pt's history.  He recommended an MRI thoracic and lumbar spine without contrast, stat.  Pt will need to be transferred to [facility] for imaging.  States he will attempt to cooridinate this.  AICU RN updated on plan.  States pt's bleeding is normal.  I was unable to see I/O documentation while on L&D.  Due to tachycardia will give a 500 cc bolus due to fever.  Check CBC with diff for WBC and Hg.

## 2012-12-11 NOTE — Progress Notes (Signed)
Informed by RN that pt's neurologic symptoms have resolved.  She has informed Dr. Jean Rosenthal and he feels MRI can be cancelled for now.   Called Dr. Bonnielee Haff to update, message left with staff. Also, upon review of chart, Gentamycin has not been reordered. Spoke with pharmacist Jill Side).  States she will review chart and see if she needs another dose to complete a  24 hour  Course.  AICU RN informed.

## 2012-12-11 NOTE — Plan of Care (Signed)
Problem: Consults Goal: Postpartum Patient Education (See Patient Education module for education specifics.)  Outcome: Completed/Met Date Met:  12/11/12 Postpartum teaching done and questions answered.  Problem: Phase I Progression Outcomes Goal: Pain controlled with appropriate interventions Outcome: Completed/Met Date Met:  12/11/12 Pain controlled on po Motrin at this time.Patient understands has a different pain medication every 3-4 hour if needed.  Goal: Voiding adequately Outcome: Not Progressing Foley to s/d clear yellow urine. Goal: Initial discharge plan identified Outcome: Completed/Met Date Met:  12/11/12 Afebrile Stable heart rate Pain control Understands self care Understands when to call MD Understands newborn care

## 2012-12-11 NOTE — Anesthesia Postprocedure Evaluation (Addendum)
  Anesthesia Post Note  Patient: Jackie Brooks  Procedure(s) Performed: Procedure(s) (LRB): CESAREAN SECTION (N/A)  Anesthesia type: Epidural  Patient location: PACU  Post pain: Pain level controlled  Post assessment: Post-op Vital signs reviewed  Last Vitals:  Filed Vitals:   12/11/12 1014  BP: 123/60  Pulse: 176  Temp:   Resp:     Post vital signs: Reviewed  Level of consciousness: awake  Complications: No apparent anesthesia complications

## 2012-12-11 NOTE — Progress Notes (Signed)
ANTIBIOTIC CONSULT NOTE - INITIAL  Pharmacy Consult for Gentamicin Indication: prophylaxis for C-section x24h post-op   No Known Allergies  Patient Measurements: Height: 4\' 11"  (149.9 cm) Weight: 181 lb 6.4 oz (82.283 kg) IBW/kg (Calculated) : 43.2 Adjusted Body Weight: 54.9 kg  Vital Signs: Temp: 97.8 F (36.6 C) (10/19 1548) Temp src: Oral (10/19 1548) BP: 111/74 mmHg (10/19 1548) Pulse Rate: 133 (10/19 1548)  Labs:  Recent Labs  12/09/12 2050  WBC 9.4  HGB 11.5*  PLT 336   No results found for this basename: GENTTROUGH, GENTPEAK, GENTRANDOM,  in the last 72 hours   Microbiology: Recent Results (from the past 720 hour(s))  WET PREP, GENITAL     Status: Abnormal   Collection Time    12/07/12  2:02 AM      Result Value Range Status   Yeast Wet Prep HPF POC MODERATE (*) NONE SEEN Final   Trich, Wet Prep NONE SEEN  NONE SEEN Final   Clue Cells Wet Prep HPF POC NONE SEEN  NONE SEEN Final   WBC, Wet Prep HPF POC MODERATE (*) NONE SEEN Final   Comment: MANY BACTERIA SEEN    Medications:  Ampicillin, clindamycin  Assessment: 27 y.o. female G1P1001 at [redacted]w[redacted]d -- s/p C-section Estimated Ke = 0.248, Vd = 22L  Goal of Therapy:  Gentamicin peak 6-8 mg/L and Trough < 1 mg/L  Plan:  Spoke with Dr Dion Body, the 400mg  IV gent the patient received pre-op should sufficiently cover her in the post-op period. No further dosing noted. Pharmacy will sign off, please re-consult as needed.    Pietra Zuluaga L. Illene Bolus, PharmD, BCPS Clinical Pharmacist Pager: [phone] Pharmacy: [phone] 12/11/2012 5:54 PM

## 2012-12-11 NOTE — Transfer of Care (Signed)
Immediate Anesthesia Transfer of Care Note  Patient: Jackie Brooks  Procedure(s) Performed: Procedure(s): CESAREAN SECTION (N/A)  Patient Location: PACU  Anesthesia Type:Epidural  Level of Consciousness: awake, alert  and oriented  Airway & Oxygen Therapy: Patient Spontanous Breathing and Patient connected to nasal cannula oxygen  Post-op Assessment: Report given to PACU RN and Post -op Vital signs reviewed and stable  Post vital signs: Reviewed and stable  Complications: No apparent anesthesia complications

## 2012-12-11 NOTE — Progress Notes (Signed)
I was called by nursing and asked to evaluate this patient for new onset numbness in bilateral legs with hip pain. Her motor exam is Motor intact.  Patient has been febrile recently and delivered via cesarean section earlier today. Her cesarean was for failure to progress and was done under epidural that was placed yesterday and discontinued earlier today.  I called Dr. Dion Body and recommend getting a MRI to rule out evolving abscess or hematoma.

## 2012-12-12 ENCOUNTER — Encounter (HOSPITAL_COMMUNITY): Payer: Self-pay | Admitting: Obstetrics and Gynecology

## 2012-12-12 LAB — CBC
HCT: 24.8 % — ABNORMAL LOW (ref 36.0–46.0)
Hemoglobin: 8.4 g/dL — ABNORMAL LOW (ref 12.0–15.0)
MCH: 25.3 pg — ABNORMAL LOW (ref 26.0–34.0)
MCHC: 33.9 g/dL (ref 30.0–36.0)
RDW: 15.9 % — ABNORMAL HIGH (ref 11.5–15.5)

## 2012-12-12 MED ORDER — ACETAMINOPHEN 160 MG/5ML PO SOLN: 325.0000 mg | ORAL | Status: DC | PRN

## 2012-12-12 MED ORDER — OXYCODONE HCL 5 MG/5ML PO SOLN
5.0000 mg | ORAL | Status: DC | PRN
  Administered 2012-12-12: 10 mg via ORAL
  Filled 2012-12-12: qty 10

## 2012-12-12 MED ORDER — LACTATED RINGERS IV BOLUS (SEPSIS)
500.0000 mL | Freq: Once | INTRAVENOUS | Status: AC
  Administered 2012-12-12: 500 mL via INTRAVENOUS

## 2012-12-12 NOTE — Plan of Care (Signed)
Problem: Phase II Progression Outcomes Goal: Progress activity as tolerated unless otherwise ordered Outcome: Completed/Met Date Met:  12/12/12 Patient walked to 3rd floor nurses station and tolerated very well Goal: Afebrile, VS remain stable Outcome: Progressing Afebrile but remains tachycardic

## 2012-12-12 NOTE — Lactation Note (Signed)
This note was copied from the chart of Jackie Gwendalyn Souder. Lactation Consultation Note Mom has baby latched onto the left side football when I enter room, RN states baby has been on for about 25 minutes already. Mom has good technique, holding baby up close to breast. Baby does have a good strong latch, but does not maintain for more than a few suckles. At this time, baby is 110 hours old. Mom states baby does not like the nipple shield, and she is not interested in trying it now. Mom can easily hand express large drops of colostrum, and baby has audible swallows when she is latched well.  Mom is concerned that baby is not getting enough and is asking about formula. Encouraged mom to try pumping to provide pumped breast milk as a supplement if she feels baby is not breastfeeding well. Instructed mom to use formula only if there is no pumped breast milk available, but to breast feed first. Instructed mom to pump every time she offers formula to baby to maintain her supply. Enc mom to continue frequent STS and cue based breast feeding, and to call for assistance if needed.   Patient Name: Jackie BrooksH Date: 12/12/2012 Reason for consult: Follow-up assessment   Maternal Data Has patient been taught Hand Expression?: Yes  Feeding Feeding Type: Breast Fed Length of feed: 25 min (25+ minutes)  LATCH Score/Interventions Latch: Repeated attempts needed to sustain latch, nipple held in mouth throughout feeding, stimulation needed to elicit sucking reflex. Intervention(s): Skin to skin Intervention(s): Adjust position;Assist with latch;Breast compression  Audible Swallowing: A few with stimulation Intervention(s): Skin to skin;Hand expression Intervention(s): Skin to skin  Type of Nipple: Everted at rest and after stimulation  Comfort (Breast/Nipple): Soft / non-tender     Hold (Positioning): Assistance needed to correctly position infant at breast and maintain  latch. Intervention(s): Breastfeeding basics reviewed;Support Pillows;Position options;Skin to skin  LATCH Score: 7  Lactation Tools Discussed/Used     Consult Status Consult Status: Follow-up Follow-up type: In-patient    Octavio Manns [facility] 12/12/2012, 11:21 AM

## 2012-12-12 NOTE — Progress Notes (Signed)
Subjective: Postpartum Day 1: Cesarean Delivery Patient reports tolerating PO and no problems voiding.  Denies N/V.  Denies HA, light headedness or dizziness.  Says she feels good.  Objective: Vital signs in last 24 hours: Temp:  [97.2 F (36.2 C)-98.4 F (36.9 C)] 97.2 F (36.2 C) (10/20 1227) Pulse Rate:  [109-140] 115 (10/20 1400) Resp:  [16-18] 18 (10/20 1351) BP: (100-115)/(53-75) 108/55 mmHg (10/20 1227) SpO2:  [95 %-100 %] 100 % (10/20 1400)  Physical Exam:  General: alert and no distress Lochia: appropriate Uterine Fundus: firm but hard to feel due to distention, NABS Incision: healing well, dressing c/d/intact DVT Evaluation: No evidence of DVT seen on physical exam. 1+ pitting edema   Recent Labs  12/11/12 1755 12/12/12 0542  HGB 9.7* 8.4*  HCT 28.7* 24.8*    Assessment/Plan: Status post Cesarean section. Postoperative course complicated by numbness in extremities that has resolved.  Also with asymptomatice abdominal distention.  Will back down to clears until + flatus as prevention for post op ileus.  Otherwise pt is doing well.  Will give another bolus of 500 cc secondary to tachycardia Cont Is/Os May transfer to floor Revert to clears and advance diet with + flatus  Jackie Brooks Y 12/12/2012, 2:25 PM

## 2012-12-12 NOTE — Progress Notes (Signed)
Pt ambulated to rm #[id number] for transfer to [facility]. Alfonzo Feller, RN updated.

## 2012-12-12 NOTE — Anesthesia Postprocedure Evaluation (Signed)
Anesthesia Post Note  Patient: Jackie Brooks  Procedure(s) Performed: Procedure(s) (LRB): CESAREAN SECTION (N/A)  Anesthesia type: Epidural  Patient location: Mother/Baby  Post pain: Pain level controlled  Post assessment: Post-op Vital signs reviewed  Last Vitals:  Filed Vitals:   12/12/12 0800  BP:   Pulse:   Temp: 36.9 C  Resp: 18    Post vital signs: Reviewed  Level of consciousness: awake  Complications: No apparent anesthesia complications

## 2012-12-13 MED ORDER — IBUPROFEN 100 MG/5ML PO SUSP: 600.0000 mg | Freq: Four times a day (QID) | ORAL | Status: DC | PRN

## 2012-12-13 MED ORDER — FERROUS SULFATE 300 (60 FE) MG/5ML PO SYRP: 300.0000 mg | ORAL_SOLUTION | Freq: Every day | ORAL | Status: DC

## 2012-12-13 MED ORDER — HYDROCODONE-ACETAMINOPHEN 7.5-325 MG/15ML PO SOLN: 15.0000 mL | Freq: Four times a day (QID) | ORAL | Status: DC | PRN

## 2012-12-13 NOTE — Lactation Note (Signed)
This note was copied from the chart of Jackie Claudia Olund. Lactation Consultation Note: Mom reports that baby just finished feeding for 15 minutes. Reports that baby is nursing much better- latching on well. Reports that her nipples are a little tender but it just hurts for a few minutes at the beginning of the feeding. Reviewed wide open mouth and keeping the baby close tot he breast through the entire feeding. No questions at present. To call prn.  Patient Name: Jackie Brooks [id number] Date: 12/13/2012 Reason for consult: Follow-up assessment   Maternal Data    Feeding Feeding Type: Breast Fed Length of feed: 15 min  LATCH Score/Interventions                      Lactation Tools Discussed/Used     Consult Status Consult Status: Complete    Pamelia Hoit 12/13/2012, 12:25 PM

## 2012-12-13 NOTE — Discharge Summary (Signed)
Cesarean Section Delivery Discharge Summary  Jackie Brooks  DOB:    Oct 11, 1985 MRN:    [id number] CSN:    [id number]  Date of admission:                  12/09/2012  Date of discharge:                   12/13/2012  Procedures this admission:  Date of Delivery: 12/11/2012 low transverse cesarean section by Dr. Thomasenia Bottoms  Newborn Data:  Live born female  Birth Weight: 7 lb 2.8 oz (3255 g) APGAR: 9, 9  Home with mother. Name: Jackie Brooks  History of Present Illness:  Jackie Brooks is a 27 y.o. female, G1P1001, who presents at [redacted]w[redacted]d weeks gestation. The patient has been followed at the [facility] and Gynecology division of [facility].   The patient was admitted for induction because of post dates. She was given Cytotec followed by Pitocin. On 01/11/2013 the patient developed a temperature. She did not progressed in spite of adequate contractions. A cesarean section was performed.  Patient Active Problem List   Diagnosis Date Noted  . Post-dates pregnancy 12/09/2012  . Late prenatal care 09/02/2012  . Migraines 09/02/2012   none.  Hospital course:  The patient was admitted for induction.   A cesarean section was performed because of failure to progress and chorioamnionitis. Her postpartum course was not complicated. Her postpartum hemoglobin was 8.4 but the patient was not orthostatic. She was able to ambulate without difficulty. She was discharged to home on postpartum day 2 doing well.  Feeding:  breast  Contraception:  no method  Discharge hemoglobin:  Hemoglobin  Date Value Range Status  12/12/2012 8.4* 12.0 - 15.0 g/dL Final     HCT  Date Value Range Status  12/12/2012 24.8* 36.0 - 46.0 % Final    Discharge Physical Exam:   General: no distress Lochia: appropriate Uterine Fundus: firm Incision: healing well DVT Evaluation: No evidence of DVT seen on physical exam.  Intrapartum Procedures: cesarean: low  cervical, transverse Postpartum Procedures: none Complications-Operative and Postpartum: none  Discharge Diagnoses: Postdates pregnancy, anemia, chorioamnionitis, 30 to progress, obesity,  Discharge Information:  Activity:           pelvic rest Diet:                routine Medications: PNV, Ibuprofen, Iron and Vicodin Condition:      stable and improved Instructions:  Care After Cesarean Delivery  Refer to this sheet in the next few weeks. These instructions provide you with information on caring for yourself after your procedure. Your caregiver may also give you specific instructions. Your treatment has been planned according to current medical practices, but problems sometimes occur. Call your caregiver if you have any problems or questions after you go home. HOME CARE INSTRUCTIONS  Only take over-the-counter or prescription medicines as directed by your caregiver.  Do not drink alcohol, especially if you are breastfeeding or taking medicine to relieve pain.  Do not chew or smoke tobacco.  Continue to use good perineal care. Good perineal care includes:  Wiping your perineum from front to back.  Keeping your perineum clean.  Check your cut (incision) daily for increased redness, drainage, swelling, or separation of skin.  Clean your incision gently with soap and water every day, and then pat it dry. If your caregiver says it is okay, leave the incision uncovered. Use a bandage (dressing) if  the incision is draining fluid or appears irritated. If the adhesive strips across the incision do not fall off within 7 days, carefully peel them off.  Hug a pillow when coughing or sneezing until your incision is healed. This helps to relieve pain.  Do not use tampons or douche until your caregiver says it is okay.  Shower, wash your hair, and take tub baths as directed by your caregiver.  Wear a well-fitting bra that provides breast support.  Limit wearing support panties or  control-top hose.  Drink enough fluids to keep your urine clear or pale yellow.  Eat high-fiber foods such as whole grain cereals and breads, brown rice, beans, and fresh fruits and vegetables every day. These foods may help prevent or relieve constipation.  Resume activities such as climbing stairs, driving, lifting, exercising, or traveling as directed by your caregiver.  Talk to your caregiver about resuming sexual activities. This is dependent upon your risk of infection, your rate of healing, and your comfort and desire to resume sexual activity.  Try to have someone help you with your household activities and your newborn for at least a few days after you leave the hospital.  Rest as much as possible. Try to rest or take a nap when your newborn is sleeping.  Increase your activities gradually.  Keep all of your scheduled postpartum appointments. It is very important to keep your scheduled follow-up appointments. At these appointments, your caregiver will be checking to make sure that you are healing physically and emotionally. SEEK MEDICAL CARE IF:   You are passing large clots from your vagina. Save any clots to show your caregiver.  You have a foul smelling discharge from your vagina.  You have trouble urinating.  You are urinating frequently.  You have pain when you urinate.  You have a change in your bowel movements.  You have increasing redness, pain, or swelling near your incision.  You have pus draining from your incision.  Your incision is separating.  You have painful, hard, or reddened breasts.  You have a severe headache.  You have blurred vision or see spots.  You feel sad or depressed.  You have thoughts of hurting yourself or your newborn.  You have questions about your care, the care of your newborn, or medicines.  You are dizzy or lightheaded.  You have a rash.  You have pain, redness, or swelling at the site of the removed intravenous access  (IV) tube.  You have nausea or vomiting.  You stopped breastfeeding and have not had a menstrual period within 12 weeks of stopping.  You are not breastfeeding and have not had a menstrual period within 12 weeks of delivery.  You have a fever. SEEK IMMEDIATE MEDICAL CARE IF:  You have persistent pain.  You have chest pain.  You have shortness of breath.  You faint.  You have leg pain.  You have stomach pain.  Your vaginal bleeding saturates 2 or more sanitary pads in 1 hour. MAKE SURE YOU:   Understand these instructions.  Will watch your condition.  Will get help right away if you are not doing well or get worse. Document Released: 11/01/2001 Document Revised: 11/04/2011 Document Reviewed: 10/07/2011 [facility] Patient Information 2014 [place], [place].   Postpartum Depression and Baby Blues  The postpartum period begins right after the birth of a baby. During this time, there is often a great amount of joy and excitement. It is also a time of considerable changes in the  life of the parent(s). Regardless of how many times a mother gives birth, each child brings new challenges and dynamics to the family. It is not unusual to have feelings of excitement accompanied by confusing shifts in moods, emotions, and thoughts. All mothers are at risk of developing postpartum depression or the "baby blues." These mood changes can occur right after giving birth, or they may occur many months after giving birth. The baby blues or postpartum depression can be mild or severe. Additionally, postpartum depression can resolve rather quickly, or it can be a long-term condition. CAUSES Elevated hormones and their rapid decline are thought to be a main cause of postpartum depression and the baby blues. There are a number of hormones that radically change during and after pregnancy. Estrogen and progesterone usually decrease immediately after delivering your baby. The level of thyroid hormone and  various cortisol steroids also rapidly drop. Other factors that play a major role in these changes include major life events and genetics.  RISK FACTORS If you have any of the following risks for the baby blues or postpartum depression, know what symptoms to watch out for during the postpartum period. Risk factors that may increase the likelihood of getting the baby blues or postpartum depression include:  Havinga personal or family history of depression.  Having depression while being pregnant.  Having premenstrual or oral contraceptive-associated mood issues.  Having exceptional life stress.  Having marital conflict.  Lacking a social support network.  Having a baby with special needs.  Having health problems such as diabetes. SYMPTOMS Baby blues symptoms include:  Brief fluctuations in mood, such as going from extreme happiness to sadness.  Decreased concentration.  Difficulty sleeping.  Crying spells, tearfulness.  Irritability.  Anxiety. Postpartum depression symptoms typically begin within the first month after giving birth. These symptoms include:  Difficulty sleeping or excessive sleepiness.  Marked weight loss.  Agitation.  Feelings of worthlessness.  Lack of interest in activity or food. Postpartum psychosis is a very concerning condition and can be dangerous. Fortunately, it is rare. Displaying any of the following symptoms is cause for immediate medical attention. Postpartum psychosis symptoms include:  Hallucinations and delusions.  Bizarre or disorganized behavior.  Confusion or disorientation. DIAGNOSIS  A diagnosis is made by an evaluation of your symptoms. There are no medical or lab tests that lead to a diagnosis, but there are various questionnaires that a caregiver may use to identify those with the baby blues, postpartum depression, or psychosis. Often times, a screening tool called the [place] Postnatal Depression Scale is used to diagnose  depression in the postpartum period.  TREATMENT The baby blues usually goes away on its own in 1 to 2 weeks. Social support is often all that is needed. You should be encouraged to get adequate sleep and rest. Occasionally, you may be given medicines to help you sleep.  Postpartum depression requires treatment as it can last several months or longer if it is not treated. Treatment may include individual or group therapy, medicine, or both to address any social, physiological, and psychological factors that may play a role in the depression. Regular exercise, a healthy diet, rest, and social support may also be strongly recommended.  Postpartum psychosis is more serious and needs treatment right away. Hospitalization is often needed. HOME CARE INSTRUCTIONS  Get as much rest as you can. Nap when the baby sleeps.  Exercise regularly. Some women find yoga and walking to be beneficial.  Eat a balanced and nourishing diet.  Do little things that you enjoy. Have a cup of tea, take a bubble bath, read your favorite magazine, or listen to your favorite music.  Avoid alcohol.  Ask for help with household chores, cooking, grocery shopping, or running errands as needed. Do not try to do everything.  Talk to people close to you about how you are feeling. Get support from your partner, family members, friends, or other new moms.  Try to stay positive in how you think. Think about the things you are grateful for.  Do not spend a lot of time alone.  Only take medicine as directed by your caregiver.  Keep all your postpartum appointments.  Let your caregiver know if you have any concerns. SEEK MEDICAL CARE IF: You are having a reaction or problems with your medicine. SEEK IMMEDIATE MEDICAL CARE IF:  You have suicidal feelings.  You feel you may harm the baby or someone else. Document Released: 11/14/2003 Document Revised: 05/04/2011 Document Reviewed: 12/16/2010 [facility] Patient Information  2014 [place], [place].  Discharge to: home  Follow-up Information   Follow up with [facility] In 6 weeks.   Contact information:   [address] [place] [address]        Janine Limbo 12/13/2012

## 2012-12-14 ENCOUNTER — Encounter (HOSPITAL_COMMUNITY): Payer: Self-pay | Admitting: *Deleted

## 2012-12-14 ENCOUNTER — Observation Stay (HOSPITAL_COMMUNITY)
Admission: AD | Admit: 2012-12-14 | Discharge: 2012-12-15 | DRG: 776 | Disposition: A | Discharge status: Home / Self Care | Payer: Medicaid Other | Source: Ambulatory Visit | Attending: Obstetrics and Gynecology | Admitting: Obstetrics and Gynecology

## 2012-12-14 DIAGNOSIS — K560 Paralytic ileus: Secondary | ICD-10-CM | POA: Diagnosis present

## 2012-12-14 DIAGNOSIS — K567 Ileus, unspecified: Secondary | ICD-10-CM | POA: Diagnosis present

## 2012-12-14 DIAGNOSIS — Z98891 History of uterine scar from previous surgery: Secondary | ICD-10-CM

## 2012-12-14 DIAGNOSIS — R109 Unspecified abdominal pain: Secondary | ICD-10-CM | POA: Diagnosis present

## 2012-12-14 DIAGNOSIS — R609 Edema, unspecified: Secondary | ICD-10-CM | POA: Diagnosis present

## 2012-12-14 DIAGNOSIS — O9081 Anemia of the puerperium: Secondary | ICD-10-CM | POA: Diagnosis present

## 2012-12-14 DIAGNOSIS — D649 Anemia, unspecified: Secondary | ICD-10-CM | POA: Diagnosis present

## 2012-12-14 DIAGNOSIS — IMO0002 Reserved for concepts with insufficient information to code with codable children: Secondary | ICD-10-CM | POA: Diagnosis present

## 2012-12-14 LAB — CBC WITH DIFFERENTIAL/PLATELET
Basophils Absolute: 0 10*3/uL (ref 0.0–0.1)
Hemoglobin: 8.5 g/dL — ABNORMAL LOW (ref 12.0–15.0)
Lymphocytes Relative: 17 % (ref 12–46)
Lymphs Abs: 1.7 10*3/uL (ref 0.7–4.0)
Monocytes Relative: 6 % (ref 3–12)
Neutro Abs: 7.9 10*3/uL — ABNORMAL HIGH (ref 1.7–7.7)
Neutrophils Relative %: 76 % (ref 43–77)
Platelets: 411 10*3/uL — ABNORMAL HIGH (ref 150–400)
RDW: 15.8 % — ABNORMAL HIGH (ref 11.5–15.5)
WBC: 10.3 10*3/uL (ref 4.0–10.5)

## 2012-12-14 MED ORDER — IBUPROFEN 100 MG/5ML PO SUSP
800.0000 mg | Freq: Three times a day (TID) | ORAL | Status: DC | PRN
  Administered 2012-12-15: 800 mg via ORAL
  Filled 2012-12-14: qty 40

## 2012-12-14 NOTE — MAU Provider Note (Signed)
History   27 yo G1P1 s/p primary LTCS on 10/19 presented unannounced c/o severe pedal edema and abdominal pain.  Was d/c'd home on 10/21--had some abdominal distension on day 2, but had BM in hospital and no other issues.  No hx gestational hypertension.  Denies HA, visual sx, N/V, anorexia, fever, or epigastric pain.  Had small amount of liquid stool today.  Feels abdomen is very crampy, with small amount lochia.  Reports no flatus.  Edema of lower extremities increased in last 24 hours.  Patient is breastfeeding.   Had C/S due to FTP and chorioamnionitis--was induced due to post-dates.  Delivered by Dr. Dion Body for [facility].  Had episode of tingling in lower extremities on day 1--cleared by Anesthesia, decided no MRI needed.  Sx resolved spontaneously, without recurrence.  Hgb was 8.4 on day 1 post-op, 11.5 on day of admission.  Patient Active Problem List   Diagnosis Date Noted  . Anemia 12/14/2012  . Status post primary low transverse cesarean section 12/14/2012  . Post-dates pregnancy 12/09/2012  . Late prenatal care 09/02/2012  . Migraines 09/02/2012      HPI:  See above  OB History   Grav Para Term Preterm Abortions TAB SAB Ect Mult Living   1 1 1  0 0 0 0 0 0 1    12/11/12--Primary LTCS due to FTP and chorioamnionitis, female, 7+2, delivered by Dr. Dion Body for [facility]  Past Medical History  Diagnosis Date  . Headache(784.0)   . Medical history non-contributory   . Infection     UTI    Past Surgical History  Procedure Laterality Date  . No past surgeries    . Cesarean section N/A 12/11/2012    Procedure: CESAREAN SECTION;  Surgeon: Geryl Rankins, MD;  Location: [facility];  Service: Obstetrics;  Laterality: N/A;    Family History  Problem Relation Age of Onset  . Cancer Paternal Aunt     ? brain  . Cancer Paternal Grandmother     breast  . Hypertension Mother     History  Substance Use Topics  . Smoking status: Passive Smoke Exposure - Never Smoker  . Smokeless  tobacco: Never Used     Comment: father smokes  . Alcohol Use: No    Allergies: No Known Allergies  Prescriptions prior to admission  Medication Sig Dispense Refill  . ferrous sulfate 300 (60 FE) MG/5ML syrup Take 5 mLs (300 mg total) by mouth daily.  150 mL  3  . HYDROcodone-acetaminophen (HYCET) 7.5-325 mg/15 ml solution Take 15 mLs by mouth 4 (four) times daily as needed for pain.  120 mL  0  . ibuprofen (CHILD IBUPROFEN) 100 MG/5ML suspension Take 30 mLs (600 mg total) by mouth every 6 (six) hours as needed for fever.  600 mL  2    ROS:  Abdominal cramping, pedal edema Physical Exam   Blood pressure 133/86, pulse 107, resp. rate 16, unknown if currently breastfeeding. Filed Vitals:   12/15/12 0004 12/15/12 0051 12/15/12 0053 12/15/12 0059  BP: 116/76 128/88 125/85 120/85  Pulse: 106     Resp:         Physical Exam Chest clear Heart RRR without murmur Abd--upper abdominal distension, with significant tympani, bowels palpable through skin.  No rebound or guarding. + bowel sounds in all quadrants. Uterus--approx 12 week size, NT. Incision--Honeycomb dressing CDI Pelvic--deferred, small amount lochia noted. Ext--DTR 1+, no clonus, 2+ edema in LE.  Results for orders placed during the hospital encounter of 12/14/12 (  from the past 24 hour(s))  CBC WITH DIFFERENTIAL     Status: Abnormal   Collection Time    12/14/12 11:30 PM      Result Value Range   WBC 10.3  4.0 - 10.5 K/uL   RBC 3.40 (*) 3.87 - 5.11 MIL/uL   Hemoglobin 8.5 (*) 12.0 - 15.0 g/dL   HCT 29.5 (*) 62.1 - 30.8 %   MCV 74.1 (*) 78.0 - 100.0 fL   MCH 25.0 (*) 26.0 - 34.0 pg   MCHC 33.7  30.0 - 36.0 g/dL   RDW 65.7 (*) 84.6 - 96.2 %   Platelets 411 (*) 150 - 400 K/uL   Neutrophils Relative % 76  43 - 77 %   Neutro Abs 7.9 (*) 1.7 - 7.7 K/uL   Lymphocytes Relative 17  12 - 46 %   Lymphs Abs 1.7  0.7 - 4.0 K/uL   Monocytes Relative 6  3 - 12 %   Monocytes Absolute 0.6  0.1 - 1.0 K/uL   Eosinophils Relative  1  0 - 5 %   Eosinophils Absolute 0.1  0.0 - 0.7 K/uL   Basophils Relative 0  0 - 1 %   Basophils Absolute 0.0  0.0 - 0.1 K/uL  COMPREHENSIVE METABOLIC PANEL     Status: Abnormal   Collection Time    12/14/12 11:30 PM      Result Value Range   Sodium 139  135 - 145 mEq/L   Potassium 3.9  3.5 - 5.1 mEq/L   Chloride 107  96 - 112 mEq/L   CO2 21  19 - 32 mEq/L   Glucose, Bld 77  70 - 99 mg/dL   BUN 6  6 - 23 mg/dL   Creatinine, Ser 9.52  0.50 - 1.10 mg/dL   Calcium 8.6  8.4 - 84.1 mg/dL   Total Protein 5.5 (*) 6.0 - 8.3 g/dL   Albumin 2.1 (*) 3.5 - 5.2 g/dL   AST 52 (*) 0 - 37 U/L   ALT 43 (*) 0 - 35 U/L   Alkaline Phosphatase 184 (*) 39 - 117 U/L   Total Bilirubin 0.2 (*) 0.3 - 1.2 mg/dL   GFR calc non Af Amer >90  >90 mL/min   GFR calc Af Amer >90  >90 mL/min  LACTATE DEHYDROGENASE     Status: None   Collection Time    12/14/12 11:30 PM      Result Value Range   LDH 232  94 - 250 U/L  URIC ACID     Status: None   Collection Time    12/14/12 11:30 PM      Result Value Range   Uric Acid, Serum 6.3  2.4 - 7.0 mg/dL  URINALYSIS, ROUTINE W REFLEX MICROSCOPIC     Status: Abnormal   Collection Time    12/15/12  1:00 AM      Result Value Range   Color, Urine YELLOW  YELLOW   APPearance CLEAR  CLEAR   Specific Gravity, Urine <1.005 (*) 1.005 - 1.030   pH 6.0  5.0 - 8.0   Glucose, UA NEGATIVE  NEGATIVE mg/dL   Hgb urine dipstick MODERATE (*) NEGATIVE   Bilirubin Urine NEGATIVE  NEGATIVE   Ketones, ur NEGATIVE  NEGATIVE mg/dL   Protein, ur NEGATIVE  NEGATIVE mg/dL   Urobilinogen, UA 0.2  0.0 - 1.0 mg/dL   Nitrite NEGATIVE  NEGATIVE   Leukocytes, UA SMALL (*) NEGATIVE  URINE MICROSCOPIC-ADD ON  Status: None   Collection Time    12/15/12  1:00 AM      Result Value Range   Squamous Epithelial / LPF RARE  RARE   WBC, UA 3-6  <3 WBC/hpf   RBC / HPF 3-6  <3 RBC/hpf   Bacteria, UA RARE  RARE   Abdominal xray--Colonic ileus, no evidence bowel obstruction. Received following  meds at 0046:   Reglan 10 mg po (crushed)               HCTZ 25 mg po (capsule opened)                                                           Ibuprophen 800 mg solution   ED Course  S/P primary LTCS on 10/19 Severe pedal edema Abdominal distension--ileus Anemia Mild elevation of LFTs Protein/creatnine pending.  Plan: Consulted with Dr. Epifania Gore to be admitted. IV LR at 125 cc/hr Will await protein/creatnine ratio to determine if magnesium sulfate needed--this will determine placement on AICU vs 3rd floor. Maintain NPO status Reglan 10 mg IV q 6 hours. Repeat PIH labs late afternoon, with MG level if infusion initiated.   Nigel Bridgeman CNM, [place] 12/14/2012 11:19 PM

## 2012-12-15 ENCOUNTER — Encounter (HOSPITAL_COMMUNITY): Payer: Self-pay

## 2012-12-15 ENCOUNTER — Inpatient Hospital Stay (HOSPITAL_COMMUNITY): Payer: Medicaid Other

## 2012-12-15 DIAGNOSIS — R609 Edema, unspecified: Secondary | ICD-10-CM | POA: Diagnosis present

## 2012-12-15 DIAGNOSIS — K567 Ileus, unspecified: Secondary | ICD-10-CM | POA: Diagnosis present

## 2012-12-15 LAB — URINALYSIS, ROUTINE W REFLEX MICROSCOPIC
Bilirubin Urine: NEGATIVE
Ketones, ur: NEGATIVE mg/dL
Nitrite: NEGATIVE
Specific Gravity, Urine: <1.005 — ABNORMAL LOW (ref 1.005–1.030)
pH: 6 (ref 5.0–8.0)

## 2012-12-15 LAB — COMPREHENSIVE METABOLIC PANEL
ALT: 43 U/L — ABNORMAL HIGH (ref 0–35)
AST: 52 U/L — ABNORMAL HIGH (ref 0–37)
Alkaline Phosphatase: 184 U/L — ABNORMAL HIGH (ref 39–117)
BUN: 5 mg/dL — ABNORMAL LOW (ref 6–23)
CO2: 21 mEq/L (ref 19–32)
Calcium: 8.5 mg/dL (ref 8.4–10.5)
Calcium: 8.6 mg/dL (ref 8.4–10.5)
Chloride: 107 mEq/L (ref 96–112)
Creatinine, Ser: 0.66 mg/dL (ref 0.50–1.10)
GFR calc Af Amer: >90 mL/min (ref >90)
GFR calc non Af Amer: >90 mL/min (ref >90)
Glucose, Bld: 77 mg/dL (ref 70–99)
Glucose, Bld: 83 mg/dL (ref 70–99)
Potassium: 3.9 mEq/L (ref 3.5–5.1)
Sodium: 139 mEq/L (ref 135–145)
Total Bilirubin: 0.2 mg/dL — ABNORMAL LOW (ref 0.3–1.2)
Total Protein: 5.5 g/dL — ABNORMAL LOW (ref 6.0–8.3)

## 2012-12-15 LAB — PROTEIN / CREATININE RATIO, URINE: Creatinine, Urine: 12.98 mg/dL

## 2012-12-15 LAB — URINE MICROSCOPIC-ADD ON

## 2012-12-15 LAB — CBC WITH DIFFERENTIAL/PLATELET
Basophils Relative: 0 % (ref 0–1)
Eosinophils Absolute: 0.2 10*3/uL (ref 0.0–0.7)
HCT: 25.2 % — ABNORMAL LOW (ref 36.0–46.0)
Hemoglobin: 8.6 g/dL — ABNORMAL LOW (ref 12.0–15.0)
MCH: 25.4 pg — ABNORMAL LOW (ref 26.0–34.0)
MCHC: 34.1 g/dL (ref 30.0–36.0)
Monocytes Absolute: 0.4 10*3/uL (ref 0.1–1.0)
Monocytes Relative: 4 % (ref 3–12)
Neutro Abs: 8.4 10*3/uL — ABNORMAL HIGH (ref 1.7–7.7)

## 2012-12-15 LAB — LACTATE DEHYDROGENASE: LDH: 194 U/L (ref 94–250)

## 2012-12-15 LAB — URIC ACID
Uric Acid, Serum: 6.3 mg/dL (ref 2.4–7.0)
Uric Acid, Serum: 6.8 mg/dL (ref 2.4–7.0)

## 2012-12-15 MED ORDER — METOCLOPRAMIDE HCL 10 MG PO TABS
10.0000 mg | ORAL_TABLET | Freq: Four times a day (QID) | ORAL | Status: DC
  Administered 2012-12-15 (×2): 10 mg via ORAL
  Filled 2012-12-15 (×2): qty 1

## 2012-12-15 MED ORDER — HYDROCHLOROTHIAZIDE 25 MG PO TABS
25.0000 mg | ORAL_TABLET | Freq: Every day | ORAL | Status: DC
  Filled 2012-12-15: qty 1

## 2012-12-15 MED ORDER — HYDROCHLOROTHIAZIDE 25 MG PO TABS: 25.0000 mg | ORAL_TABLET | Freq: Every day | ORAL | Status: DC

## 2012-12-15 MED ORDER — KETOROLAC TROMETHAMINE 30 MG/ML IJ SOLN: 30.0000 mg | Freq: Four times a day (QID) | INTRAMUSCULAR | Status: DC | PRN

## 2012-12-15 MED ORDER — BUTORPHANOL TARTRATE 1 MG/ML IJ SOLN: 1.0000 mg | INTRAMUSCULAR | Status: DC | PRN

## 2012-12-15 MED ORDER — METOCLOPRAMIDE HCL 5 MG/5ML PO SOLN: 10.0000 mg | Freq: Three times a day (TID) | ORAL | Status: DC

## 2012-12-15 MED ORDER — PRENATAL MULTIVITAMIN CH
1.0000 | ORAL_TABLET | Freq: Every day | ORAL | Status: DC
  Administered 2012-12-15: 1 via ORAL
  Filled 2012-12-15 (×2): qty 1

## 2012-12-15 MED ORDER — LACTATED RINGERS IV SOLN: INTRAVENOUS | Status: DC

## 2012-12-15 MED ORDER — METOCLOPRAMIDE HCL 5 MG/ML IJ SOLN: 10.0000 mg | Freq: Four times a day (QID) | INTRAMUSCULAR | Status: DC

## 2012-12-15 MED ORDER — MAGNESIUM CITRATE PO SOLN
1.0000 | Freq: Once | ORAL | Status: DC
  Filled 2012-12-15: qty 296

## 2012-12-15 MED ORDER — METOCLOPRAMIDE HCL 10 MG PO TABS
10.0000 mg | ORAL_TABLET | Freq: Once | ORAL | Status: AC
  Administered 2012-12-15: 10 mg via ORAL
  Filled 2012-12-15: qty 1

## 2012-12-15 MED ORDER — HYDROCHLOROTHIAZIDE 25 MG PO TABS
25.0000 mg | ORAL_TABLET | Freq: Every day | ORAL | Status: DC
  Administered 2012-12-15: 25 mg via ORAL
  Filled 2012-12-15 (×2): qty 1

## 2012-12-15 NOTE — H&P (Signed)
27 yo G1P1 s/p primary LTCS on 10/19 presented unannounced c/o severe pedal edema and abdominal pain.  Was d/c'd home on 10/21--had some abdominal distension on day 2, but had BM in hospital and no other issues.  No hx gestational hypertension.  Denies HA, visual sx, N/V, anorexia, fever, or epigastric pain.  Had small amount of liquid stool today.  Feels abdomen is very crampy, with small amount lochia.  Reports no flatus.  Edema of lower extremities increased in last 24 hours.  Patient is breastfeeding.   Had C/S due to FTP and chorioamnionitis--was induced due to post-dates.  Delivered by Dr. Dion Body for [facility].  Had episode of tingling in lower extremities on day 1--cleared by Anesthesia, decided no MRI needed.  Sx resolved spontaneously, without recurrence.  Hgb was 8.4 on day 1 post-op, 11.5 on day of admission.  PATIENT HAS DIFFICULTY SWALLOWING PILLS.  Patient Active Problem List   Diagnosis Date Noted  . Anemia 12/14/2012  . Status post primary low transverse cesarean section 12/14/2012  . Post-dates pregnancy 12/09/2012  . Late prenatal care 09/02/2012  . Migraines 09/02/2012      HPI:  See above  OB History   Grav Para Term Preterm Abortions TAB SAB Ect Mult Living   1 1 1  0 0 0 0 0 0 1    12/11/12--Primary LTCS due to FTP and chorioamnionitis, female, 7+2, delivered by Dr. Dion Body for [facility]  Past Medical History  Diagnosis Date  . Headache(784.0)   . Medical history non-contributory   . Infection     UTI    Past Surgical History  Procedure Laterality Date  . No past surgeries    . Cesarean section N/A 12/11/2012    Procedure: CESAREAN SECTION;  Surgeon: Geryl Rankins, MD;  Location: [facility];  Service: Obstetrics;  Laterality: N/A;    Family History  Problem Relation Age of Onset  . Cancer Paternal Aunt     ? brain  . Cancer Paternal Grandmother     breast  . Hypertension Mother     History  Substance Use Topics  . Smoking status: Passive Smoke Exposure -  Never Smoker  . Smokeless tobacco: Never Used     Comment: father smokes  . Alcohol Use: No    Allergies: No Known Allergies  Prescriptions prior to admission  Medication Sig Dispense Refill  . ferrous sulfate 300 (60 FE) MG/5ML syrup Take 5 mLs (300 mg total) by mouth daily.  150 mL  3  . HYDROcodone-acetaminophen (HYCET) 7.5-325 mg/15 ml solution Take 15 mLs by mouth 4 (four) times daily as needed for pain.  120 mL  0  . ibuprofen (CHILD IBUPROFEN) 100 MG/5ML suspension Take 30 mLs (600 mg total) by mouth every 6 (six) hours as needed for fever.  600 mL  2    ROS:  Abdominal cramping, pedal edema Physical Exam   Blood pressure 133/86, pulse 107, resp. rate 16, unknown if currently breastfeeding. Filed Vitals:   12/15/12 0004 12/15/12 0051 12/15/12 0053 12/15/12 0059  BP: 116/76 128/88 125/85 120/85  Pulse: 106     Resp:         Physical Exam Chest clear Heart RRR without murmur Abd--upper abdominal distension, with significant tympani, bowels palpable through skin.  No rebound or guarding. + bowel sounds in all quadrants. Uterus--approx 12 week size, NT. Incision--Honeycomb dressing CDI Pelvic--deferred, small amount lochia noted. Ext--DTR 1+, no clonus, 2+ edema in LE.  Results for orders placed during the hospital  encounter of 12/14/12 (from the past 24 hour(s))  CBC WITH DIFFERENTIAL     Status: Abnormal   Collection Time    12/14/12 11:30 PM      Result Value Range   WBC 10.3  4.0 - 10.5 K/uL   RBC 3.40 (*) 3.87 - 5.11 MIL/uL   Hemoglobin 8.5 (*) 12.0 - 15.0 g/dL   HCT 45.4 (*) 09.8 - 11.9 %   MCV 74.1 (*) 78.0 - 100.0 fL   MCH 25.0 (*) 26.0 - 34.0 pg   MCHC 33.7  30.0 - 36.0 g/dL   RDW 14.7 (*) 82.9 - 56.2 %   Platelets 411 (*) 150 - 400 K/uL   Neutrophils Relative % 76  43 - 77 %   Neutro Abs 7.9 (*) 1.7 - 7.7 K/uL   Lymphocytes Relative 17  12 - 46 %   Lymphs Abs 1.7  0.7 - 4.0 K/uL   Monocytes Relative 6  3 - 12 %   Monocytes Absolute 0.6  0.1 - 1.0  K/uL   Eosinophils Relative 1  0 - 5 %   Eosinophils Absolute 0.1  0.0 - 0.7 K/uL   Basophils Relative 0  0 - 1 %   Basophils Absolute 0.0  0.0 - 0.1 K/uL  COMPREHENSIVE METABOLIC PANEL     Status: Abnormal   Collection Time    12/14/12 11:30 PM      Result Value Range   Sodium 139  135 - 145 mEq/L   Potassium 3.9  3.5 - 5.1 mEq/L   Chloride 107  96 - 112 mEq/L   CO2 21  19 - 32 mEq/L   Glucose, Bld 77  70 - 99 mg/dL   BUN 6  6 - 23 mg/dL   Creatinine, Ser 1.30  0.50 - 1.10 mg/dL   Calcium 8.6  8.4 - 86.5 mg/dL   Total Protein 5.5 (*) 6.0 - 8.3 g/dL   Albumin 2.1 (*) 3.5 - 5.2 g/dL   AST 52 (*) 0 - 37 U/L   ALT 43 (*) 0 - 35 U/L   Alkaline Phosphatase 184 (*) 39 - 117 U/L   Total Bilirubin 0.2 (*) 0.3 - 1.2 mg/dL   GFR calc non Af Amer >90  >90 mL/min   GFR calc Af Amer >90  >90 mL/min  LACTATE DEHYDROGENASE     Status: None   Collection Time    12/14/12 11:30 PM      Result Value Range   LDH 232  94 - 250 U/L  URIC ACID     Status: None   Collection Time    12/14/12 11:30 PM      Result Value Range   Uric Acid, Serum 6.3  2.4 - 7.0 mg/dL  URINALYSIS, ROUTINE W REFLEX MICROSCOPIC     Status: Abnormal   Collection Time    12/15/12  1:00 AM      Result Value Range   Color, Urine YELLOW  YELLOW   APPearance CLEAR  CLEAR   Specific Gravity, Urine <1.005 (*) 1.005 - 1.030   pH 6.0  5.0 - 8.0   Glucose, UA NEGATIVE  NEGATIVE mg/dL   Hgb urine dipstick MODERATE (*) NEGATIVE   Bilirubin Urine NEGATIVE  NEGATIVE   Ketones, ur NEGATIVE  NEGATIVE mg/dL   Protein, ur NEGATIVE  NEGATIVE mg/dL   Urobilinogen, UA 0.2  0.0 - 1.0 mg/dL   Nitrite NEGATIVE  NEGATIVE   Leukocytes, UA SMALL (*) NEGATIVE  URINE MICROSCOPIC-ADD  ON     Status: None   Collection Time    12/15/12  1:00 AM      Result Value Range   Squamous Epithelial / LPF RARE  RARE   WBC, UA 3-6  <3 WBC/hpf   RBC / HPF 3-6  <3 RBC/hpf   Bacteria, UA RARE  RARE   Abdominal xray--Colonic ileus, no evidence bowel  obstruction. Received following meds at 0046:   Reglan 10 mg po (crushed)               HCTZ 25 mg po (capsule opened)                                                           Ibuprophen 800 mg solution   Protein/creatnine ratio--too low to measure.  ED Course  S/P primary LTCS on 10/19 Severe pedal edema Abdominal distension--ileus Anemia Mild elevation of LFTs Protein/creatnine ratio negative.  Plan: Consulted with Dr. Epifania Gore to be admitted. IV LR at 125 cc/hr Maintain NPO status Reglan 10 mg IV q 6 hours. HCTZ 25 mg po q day. Toradol IV for pain as needed. Repeat PIH labs at noon.   Nigel Bridgeman CNM, [place] 12/14/2012 11:19 PM  Addendum: Patient had BM prior to leaving MAU for 3rd floor room, slightly more formed.  Nigel Bridgeman, CNM 12/15/12 2a

## 2012-12-15 NOTE — Progress Notes (Signed)
Pt received 6 IV attempts. After 6th pt states "I am done with the sticking, I do not want to be stuck again." Call made to MD on call, Su Hilt gives orders to change Reglan PO and give with sips of water.

## 2012-12-15 NOTE — Consult Note (Signed)
Initial consult this re-admission for the mom of a term baby. Mom reports doing well with latchigngto left breast, but not to right. Mom using football hold. I suggested she try rorss cradle hold on the left. Mom will call for help with latching as needed. Lactation services reviewed with mom. She knows to call for questions/concerns.

## 2012-12-15 NOTE — Progress Notes (Signed)
Pt is discharged in the care of Mother. Downstairs escorted by N.T.. Denies any pain or discomfort. Spirits are good Discharge instructions with Rx  Were given to pt. Stated she understands all instructions  Well.

## 2012-12-15 NOTE — Progress Notes (Signed)
Pt sleeping. 

## 2012-12-15 NOTE — Discharge Summary (Signed)
Physician Discharge Summary  Patient ID: Jackie Brooks MRN: [id number] DOB/AGE: [birth year] 27 y.o.  Admit date: 12/14/2012 Discharge date: 12/15/2012  Admission Diagnoses: Status post cesarean section    Postoperative ileus    Peripheral edema    Anemia, chronic  Discharge Diagnoses:  Principal Problem:   Ileus, postoperative Active Problems:   Anemia   Status post primary low transverse cesarean section   Edema   Discharged Condition: Improved  Hospital Course: The patient was admitted to the hospital and given Reglan. Soon thereafter, she began having normal flatus and has actually had a small bowel movement. She was kept n.p.o. overnight and had no vomiting. On hospital day 2 she began a regular diet and tolerated breakfast and lunch without difficulty. With respect to nausea or vomiting. She did have a single diarrheal stool. She also was treated withHCTZ with noticeable diurese is an improvement in pedal edema.  Consults: None  Significant Diagnostic Studies: labs: CBC and comprehensive metabolic panels were within normal limits  Treatments: Bowel rest and Reglan  Discharge Exam: Blood pressure 127/77, pulse 91, temperature 98.5 F (36.9 C), temperature source Oral, resp. rate 18, weight 181 lb 8 oz (82.328 kg), SpO2 98.00%. General appearance: alert, cooperative, appears stated age and no distress Resp: clear to auscultation bilaterally Breasts: normal appearance, no masses or tenderness, engorged, but no erythema Cardio: regular rate and rhythm, S1, S2 normal, no murmur, click, rub or gallop GI: Soft normal bowel sounds. Uterus, appropriate we involuted and nontender. Incision dry dressing. No drainage Extremities: Nontender. Bilateral 2+ pedal edema, which is improving.  Disposition: 01-Home or Self Care     Medication List         ferrous sulfate 300 (60 FE) MG/5ML syrup  Take 5 mLs (300 mg total) by mouth daily.     hydrochlorothiazide 25 MG tablet   Commonly known as:  HYDRODIURIL  Take 1 tablet (25 mg total) by mouth daily.     HYDROcodone-acetaminophen 7.5-325 mg/15 ml solution  Commonly known as:  HYCET  Take 15 mLs by mouth 4 (four) times daily as needed for pain.     ibuprofen 100 MG/5ML suspension  Commonly known as:  CHILD IBUPROFEN  Take 30 mLs (600 mg total) by mouth every 6 (six) hours as needed for fever.     metoCLOPramide 5 MG/5ML solution  Commonly known as:  REGLAN  Take 10 mLs (10 mg total) by mouth 4 (four) times daily -  before meals and at bedtime.           Follow-up Information   Follow up with [facility] In 6 weeks.   Specialty:  Obstetrics and Gynecology   Contact information:   [address] [place] [address] [phone]      Signed: Hal Morales 12/15/2012, 6:45 PM

## 2013-01-21 NOTE — Progress Notes (Addendum)
Jackie Brooks is a 27 y.o. G1P0000 at [redacted]w[redacted]d admitted for induction of labor due to Post dates.   Subjective: Pt reports feeling some cramping and occas UC.  No ROM or bldg.  Active fetus.  Ranks pain as 5/10 on scale.  Has been sleeping at intervals.    Objective: BP 119/82  Pulse 122  Temp(Src) 97.5 F (36.4 C) (Oral)  Resp 18  Ht 4\' 11"  (1.499 m)  Wt 82.283 kg (181 lb 6.4 oz)  BMI 36.62 kg/m2   FHT:  FHR: 140 bpm, variability: moderate,  accelerations:  Present,  decelerations:  Absent UC:   regular, every 3-7 minutes, mild to palpation SVE:   Dilation: 1 Effacement (%): 50 Station: -3 Exam by:: [employer] CNM  Assessment / Plan: Induction of labor due to postterm  Labor: No labor at present Preeclampsia:  no signs or symptoms of toxicity Fetal Wellbeing:  Category I Pain Control:  none needed at present I/D:  n/a Anticipated MOD:  NSVD  RBA beginning pitocin d/w pt and family.  They agree with the plan to begin pitocin.   Will begin pitocin per protocol and titrate to achieve adeq contraction pattern.  Marcel Gary O. 01/21/2013, 11:33 PM

## 2013-01-21 NOTE — Progress Notes (Addendum)
Jackie Brooks is a 27 y.o. G1P0000 at [redacted]w[redacted]d.  Subjective: Pt c/o increased pain with UCs and now requesting epidural placement.  Ranks pain as 8/10.  Objective: BP 151/89 Pulse 89  Temp(Src) 97.9 F (36.6 C) (Oral)  Resp 18  Ht 4\' 11"  (1.499 m)  Wt 82.283 kg (181 lb 6.4 oz)  BMI 36.62 kg/m2  FHT:  FHR: 135 bpm, variability: moderate,  accelerations:  absent,  decelerations:  Absent UC:   regular, every 2-6 minutes, mod to palpation.  Pitocin at 91mu/min SVE:  1cm  Effacement: 50% Station: -3 Exam by: Elsie Ra, CNM  Assessment / Plan: Induction of labor due to postterm  Labor: Latent labor Preeclampsia:  no signs or symptoms of toxicity Fetal Wellbeing:  Category I Pain Control:  Epidural planning I/D:  n/a Anticipated MOD:  NSVD  Anesthesia to place epidural.   Will continue to titrate pitocin to achieve adequate contraction pattern.  Jackie Brooks O. 01/21/2013, 11:57 PM

## 2013-01-22 NOTE — Progress Notes (Addendum)
Jackie Brooks is a 27 y.o. G1P0000 at [redacted]w[redacted]d  Subjective: Pt remains comfortable with epidural.  No complaints at present.  Objective: BP 130/71  Pulse 121  Temp(Src) 98.3 F (36.8 C) (Oral)  Resp 18  Ht 4\' 11"  (1.499 m)  Wt 82.283 kg (181 lb 6.4 oz)  BMI 36.62 kg/m2   FHT:  FHR: 150 bpm, variability: moderate,  accelerations:  Present,  decelerations:  Absent UC:   regular, every 2-4 minutes.  Pitocin at 6mu/min.  MVUs 60-130 since IUPC placement.  SVE:   Dilation: 3 Effacement (%): 90 Station: -3 Exam by:: Jackie Brooks, CNM  Assessment / Plan: Induction of labor due to postterm  Labor: Latent labor Preeclampsia:  no signs or symptoms of toxicity Fetal Wellbeing:  Category I Pain Control:  Epidural I/D:  n/a Anticipated MOD:  NSVD  Continue to titrate pitocin to achieve adequate MVUs.    Prabhav Faulkenberry O. 12/10/2012, 10:40 PM

## 2013-01-22 NOTE — Progress Notes (Addendum)
Jackie Brooks is a 27 y.o. G1P000 at [redacted]w[redacted]d  Subjective: Remains comfortable with epidural.  Brooks complaints at present.    Objective: BP 117/78  Pulse 112  Temp(Src) 98.1 F (36.7 C) (Oral)  Resp 20  Ht 4\' 11"  (1.499 m)  Wt 82.283 kg (181 lb 6.4 oz)  BMI 36.62 kg/m2   FHT:  FHR: 150 bpm, variability: moderate,  accelerations:  Present,  decelerations:  Absent UC:   regular, every 2-3 minutes.  Pitocin at 50mu/min.  MVUs 110-160 maximum SVE: Deferred  Assessment / Plan: Induction of labor due to postterm  Labor: Latent labor Preeclampsia:  Brooks signs or symptoms of toxicity Fetal Wellbeing:  Category I Pain Control:  Epidural I/D:  n/a Anticipated MOD:  NSVD  Will reduce pitocin to 12mu then restart titration to max dosage again.     Eugen Jeansonne O. 01/22/2013, 1:03 AM

## 2013-01-22 NOTE — Progress Notes (Signed)
Jackie Brooks is a 27 y.o. G1P0000 at [redacted]w[redacted]d  Subjective: Remains comfortable with epidural at present but has required some PCA boluses.   Objective: BP 131/68  Pulse 155  Temp(Src) 100.8 F (38.2 C) (Axillary)  Resp 20  Ht 4\' 11"  (1.499 m)  Wt 82.283 kg (181 lb 6.4 oz)  BMI 36.62 kg/m2   FHT:  FHR: 160 bpm, variability: moderate,  accelerations:  Present,  decelerations:  Absent UC:   regular, every 2-3 minutes.  Pitocin at 40mu/min.  MVUs 80-105 despite flushing and repositioning IUPC. SVE:  Deferred at present  Assessment / Plan: Induction of labor due to postterm Maternal fever-suspected chorioamnionitis  Labor: Prolonged latent labor Preeclampsia:  no signs or symptoms of toxicity Fetal Wellbeing:  Category I Pain Control:  Epidural I/D:  Fever Anticipated MOD:  NSVD  Unasyn and tylenol ordered earlier due fever. Dr. Levora Angel updated. Continue to titrate pitocin.  Bevelyn Arriola O. 01/22/2013, 1:14 AM

## 2013-01-22 NOTE — Progress Notes (Signed)
Jackie Brooks is a 27 y.o. G1P1001 at [redacted]w[redacted]d Subjective: Pt comfortable after epidural placement.  Has dozed at intervals.  SROM of clear fluid at 1626 and continues to leak clear fluid.  Objective: BP 114/71  Pulse 109  Temp(Src) 98.2 F (36.8 C) (Oral)  Resp 18  Ht 4\' 11"  (1.499 m)  Wt 82.283 kg (181 lb 6.4 oz)  BMI 36.62 kg/m2   FHT:  FHR: 135 bpm, variability: moderate,  accelerations:  Present,  decelerations:  Absent UC:   regular, every 2-5 minutes.  Pitocin at 51mu/min SVE:   Dilation: 3 Effacement (%): 90 Station: -2 Exam by:: Conni Elliot, CNM  Assessment / Plan: Induction of labor due to postterm  Labor: Latent labor Preeclampsia:  no signs or symptoms of toxicity Fetal Wellbeing:  Category I Pain Control:  Epidural I/D:  n/a Anticipated MOD:  NSVD  RBA IUPC placement d/w pt and family.  Agree to proceed. IUPC placed without difficulty with return of clear fluid. Will continue to titrate pitocin to achieve adequate MVUs.    Sharanya Templin O. 01/22/2013, 12:15 AM

## 2013-04-23 ENCOUNTER — Emergency Department (HOSPITAL_COMMUNITY)
Admission: EM | Admit: 2013-04-23 | Discharge: 2013-04-24 | Disposition: A | Discharge status: Home / Self Care | Payer: No Typology Code available for payment source | Attending: Emergency Medicine | Admitting: Emergency Medicine

## 2013-04-23 ENCOUNTER — Emergency Department (HOSPITAL_COMMUNITY): Payer: No Typology Code available for payment source

## 2013-04-23 ENCOUNTER — Encounter (HOSPITAL_COMMUNITY): Payer: Self-pay | Admitting: Emergency Medicine

## 2013-04-23 DIAGNOSIS — S3981XA Other specified injuries of abdomen, initial encounter: Secondary | ICD-10-CM | POA: Insufficient documentation

## 2013-04-23 DIAGNOSIS — Z79899 Other long term (current) drug therapy: Secondary | ICD-10-CM | POA: Insufficient documentation

## 2013-04-23 DIAGNOSIS — Z87440 Personal history of urinary (tract) infections: Secondary | ICD-10-CM | POA: Insufficient documentation

## 2013-04-23 DIAGNOSIS — IMO0002 Reserved for concepts with insufficient information to code with codable children: Secondary | ICD-10-CM | POA: Insufficient documentation

## 2013-04-23 DIAGNOSIS — S0993XA Unspecified injury of face, initial encounter: Secondary | ICD-10-CM | POA: Insufficient documentation

## 2013-04-23 DIAGNOSIS — Z3202 Encounter for pregnancy test, result negative: Secondary | ICD-10-CM | POA: Insufficient documentation

## 2013-04-23 DIAGNOSIS — S298XXA Other specified injuries of thorax, initial encounter: Secondary | ICD-10-CM | POA: Insufficient documentation

## 2013-04-23 DIAGNOSIS — Y92410 Unspecified street and highway as the place of occurrence of the external cause: Secondary | ICD-10-CM | POA: Diagnosis not present

## 2013-04-23 DIAGNOSIS — Y9389 Activity, other specified: Secondary | ICD-10-CM | POA: Diagnosis not present

## 2013-04-23 DIAGNOSIS — S199XXA Unspecified injury of neck, initial encounter: Principal | ICD-10-CM

## 2013-04-23 DIAGNOSIS — S0990XA Unspecified injury of head, initial encounter: Secondary | ICD-10-CM | POA: Diagnosis present

## 2013-04-23 LAB — CBC WITH DIFFERENTIAL/PLATELET
BASOS ABS: 0 10*3/uL (ref 0.0–0.1)
BASOS PCT: 0 % (ref 0–1)
EOS ABS: 0.1 10*3/uL (ref 0.0–0.7)
Eosinophils Relative: 1 % (ref 0–5)
HEMATOCRIT: 36.3 % (ref 36.0–46.0)
Hemoglobin: 11.4 g/dL — ABNORMAL LOW (ref 12.0–15.0)
Lymphocytes Relative: 29 % (ref 12–46)
Lymphs Abs: 1.9 10*3/uL (ref 0.7–4.0)
MCH: 22.6 pg — AB (ref 26.0–34.0)
MCHC: 31.4 g/dL (ref 30.0–36.0)
MCV: 72 fL — AB (ref 78.0–100.0)
MONO ABS: 0.4 10*3/uL (ref 0.1–1.0)
Monocytes Relative: 6 % (ref 3–12)
Neutro Abs: 4.2 10*3/uL (ref 1.7–7.7)
Neutrophils Relative %: 64 % (ref 43–77)
Platelets: 430 10*3/uL — ABNORMAL HIGH (ref 150–400)
RBC: 5.04 MIL/uL (ref 3.87–5.11)
RDW: 14.6 % (ref 11.5–15.5)
WBC: 6.5 10*3/uL (ref 4.0–10.5)

## 2013-04-23 LAB — BASIC METABOLIC PANEL
BUN: 8 mg/dL (ref 6–23)
CO2: 23 meq/L (ref 19–32)
CREATININE: 0.65 mg/dL (ref 0.50–1.10)
Calcium: 9.7 mg/dL (ref 8.4–10.5)
Chloride: 102 mEq/L (ref 96–112)
GFR calc non Af Amer: >90 mL/min (ref >90)
Glucose, Bld: 88 mg/dL (ref 70–99)
Potassium: 4.4 mEq/L (ref 3.7–5.3)
Sodium: 139 mEq/L (ref 137–147)

## 2013-04-23 LAB — PREGNANCY, URINE: Preg Test, Ur: NEGATIVE

## 2013-04-23 MED ORDER — ONDANSETRON HCL 4 MG/2ML IJ SOLN
4.0000 mg | Freq: Once | INTRAMUSCULAR | Status: AC
  Administered 2013-04-23: 4 mg via INTRAVENOUS
  Filled 2013-04-23: qty 2

## 2013-04-23 MED ORDER — MORPHINE SULFATE 4 MG/ML IJ SOLN
4.0000 mg | Freq: Once | INTRAMUSCULAR | Status: AC
  Administered 2013-04-23: 4 mg via INTRAVENOUS
  Filled 2013-04-23: qty 1

## 2013-04-23 MED ORDER — ONDANSETRON 4 MG PO TBDP: 4.0000 mg | ORAL_TABLET | Freq: Once | ORAL | Status: DC

## 2013-04-23 MED ORDER — OXYCODONE-ACETAMINOPHEN 5-325 MG PO TABS: 2.0000 | ORAL_TABLET | Freq: Once | ORAL | Status: DC

## 2013-04-23 MED ORDER — IOHEXOL 300 MG/ML  SOLN
100.0000 mL | Freq: Once | INTRAMUSCULAR | Status: AC | PRN
  Administered 2013-04-23: 100 mL via INTRAVENOUS
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
## 2013-04-24 ENCOUNTER — Encounter (HOSPITAL_COMMUNITY): Payer: Self-pay

## 2013-04-24 MED ORDER — HYDROCODONE-ACETAMINOPHEN 5-325 MG PO TABS: 1.0000 | ORAL_TABLET | ORAL | Status: DC | PRN

## 2013-04-24 MED ORDER — HYDROCODONE-ACETAMINOPHEN 7.5-325 MG/15ML PO SOLN: 15.0000 mL | Freq: Four times a day (QID) | ORAL | Status: DC | PRN

## 2013-04-24 MED ORDER — NAPROXEN 500 MG PO TABS: 500.0000 mg | ORAL_TABLET | Freq: Two times a day (BID) | ORAL | Status: DC

## 2013-04-24 NOTE — ED Notes (Signed)
Patient is alert and oriented x3.  She was given DC instructions and follow up visit instructions.  Patient gave verbal understanding. She was DC via wheelchair to home.  V/S stable.  He was not showing any signs of distress on DC 
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
## 2013-05-04 ENCOUNTER — Encounter (HOSPITAL_COMMUNITY): Payer: Self-pay | Admitting: Emergency Medicine

## 2013-05-04 ENCOUNTER — Emergency Department (HOSPITAL_COMMUNITY): Payer: Medicaid Other

## 2013-05-04 ENCOUNTER — Emergency Department (HOSPITAL_COMMUNITY)
Admission: EM | Admit: 2013-05-04 | Discharge: 2013-05-05 | Disposition: A | Discharge status: Home / Self Care | Payer: Medicaid Other | Attending: Emergency Medicine | Admitting: Emergency Medicine

## 2013-05-04 DIAGNOSIS — R0789 Other chest pain: Secondary | ICD-10-CM

## 2013-05-04 DIAGNOSIS — Z87440 Personal history of urinary (tract) infections: Secondary | ICD-10-CM | POA: Insufficient documentation

## 2013-05-04 DIAGNOSIS — Z8619 Personal history of other infectious and parasitic diseases: Secondary | ICD-10-CM | POA: Insufficient documentation

## 2013-05-04 DIAGNOSIS — R071 Chest pain on breathing: Secondary | ICD-10-CM | POA: Insufficient documentation
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
## 2013-05-04 NOTE — ED Notes (Signed)
EKG given to EDP,Knapp,Iva,MD. For review. 
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
## 2013-05-05 NOTE — Discharge Instructions (Signed)
May take tylenol or motrin as needed for pain. You will likely continue to be sore for the next few days-- heavy lifting, pushing, pulling, etc will make this worse. Return to the ED for new concerns.

## 2013-05-08 NOTE — ED Provider Notes (Signed)
Medical screening examination/treatment/procedure(s) were performed by non-physician practitioner and as supervising physician I was immediately available for consultation/collaboration.   EKG Interpretation   Date/Time:  Thursday May 04 2013 20:47:14 EDT Ventricular Rate:  88 PR Interval:  153 QRS Duration: 87 QT Interval:  356 QTC Calculation: 431 R Axis:   25 Text Interpretation:  Sinus or ectopic atrial rhythm Low voltage,  precordial leads No significant change since last tracing Confirmed by  Anitra LauthPLUNKETT  MD, Alphonzo LemmingsWHITNEY ([id number]) on 05/04/2013 10:00:54 PM        Jackie SproutWhitney Yaseen Gilberg, MD 05/08/13 1531

## 2013-05-26 ENCOUNTER — Encounter (HOSPITAL_COMMUNITY): Payer: Self-pay | Admitting: Emergency Medicine

## 2013-05-26 ENCOUNTER — Emergency Department (HOSPITAL_COMMUNITY)
Admission: EM | Admit: 2013-05-26 | Discharge: 2013-05-27 | Disposition: A | Discharge status: Home / Self Care | Payer: Medicaid Other | Attending: Emergency Medicine | Admitting: Emergency Medicine

## 2013-05-26 DIAGNOSIS — M940 Chondrocostal junction syndrome [Tietze]: Secondary | ICD-10-CM

## 2013-05-26 DIAGNOSIS — Z87440 Personal history of urinary (tract) infections: Secondary | ICD-10-CM | POA: Insufficient documentation

## 2013-05-26 DIAGNOSIS — G8911 Acute pain due to trauma: Secondary | ICD-10-CM | POA: Insufficient documentation

## 2013-05-26 DIAGNOSIS — R0789 Other chest pain: Secondary | ICD-10-CM

## 2013-05-26 DIAGNOSIS — R071 Chest pain on breathing: Secondary | ICD-10-CM | POA: Insufficient documentation

## 2013-05-26 MED ORDER — KETOROLAC TROMETHAMINE 60 MG/2ML IM SOLN
60.0000 mg | Freq: Once | INTRAMUSCULAR | Status: AC
  Administered 2013-05-26: 60 mg via INTRAMUSCULAR
  Filled 2013-05-26: qty 2
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
## 2013-05-27 MED ORDER — ACETAMINOPHEN-CODEINE 120-12 MG/5ML PO SOLN
10.0000 mL | ORAL | Status: DC | PRN
Start: 2013-05-27 — End: 2013-08-04

## 2013-05-27 NOTE — Discharge Instructions (Signed)
Return here as needed. Follow up with your doctor. Take motrin as well for pain. Use heat on your chest wall.

## 2013-05-27 NOTE — ED Provider Notes (Signed)
Medical screening examination/treatment/procedure(s) were performed by non-physician practitioner and as supervising physician I was immediately available for consultation/collaboration.   EKG Interpretation None        Phillp Dolores N Victory Dres[place], DO 05/27/13 1503 

## 2013-08-04 ENCOUNTER — Emergency Department (HOSPITAL_COMMUNITY)
Admission: EM | Admit: 2013-08-04 | Discharge: 2013-08-04 | Disposition: A | Discharge status: Home / Self Care | Payer: Medicaid Other | Attending: Emergency Medicine | Admitting: Emergency Medicine

## 2013-08-04 ENCOUNTER — Encounter (HOSPITAL_COMMUNITY): Payer: Self-pay | Admitting: Emergency Medicine

## 2013-08-04 ENCOUNTER — Emergency Department (HOSPITAL_COMMUNITY): Payer: Medicaid Other

## 2013-08-04 DIAGNOSIS — Z3202 Encounter for pregnancy test, result negative: Secondary | ICD-10-CM | POA: Insufficient documentation

## 2013-08-04 DIAGNOSIS — R1013 Epigastric pain: Secondary | ICD-10-CM | POA: Insufficient documentation

## 2013-08-04 DIAGNOSIS — R110 Nausea: Secondary | ICD-10-CM | POA: Insufficient documentation

## 2013-08-04 DIAGNOSIS — Z87440 Personal history of urinary (tract) infections: Secondary | ICD-10-CM | POA: Insufficient documentation

## 2013-08-04 DIAGNOSIS — R109 Unspecified abdominal pain: Secondary | ICD-10-CM

## 2013-08-04 LAB — COMPREHENSIVE METABOLIC PANEL
ALT: 11 U/L (ref 0–35)
AST: 17 U/L (ref 0–37)
Albumin: 4 g/dL (ref 3.5–5.2)
Alkaline Phosphatase: 57 U/L (ref 39–117)
BUN: 9 mg/dL (ref 6–23)
CALCIUM: 9.7 mg/dL (ref 8.4–10.5)
CO2: 26 meq/L (ref 19–32)
CREATININE: 0.63 mg/dL (ref 0.50–1.10)
Chloride: 102 mEq/L (ref 96–112)
Glucose, Bld: 99 mg/dL (ref 70–99)
Potassium: 4 mEq/L (ref 3.7–5.3)
SODIUM: 139 meq/L (ref 137–147)
Total Bilirubin: 0.3 mg/dL (ref 0.3–1.2)
Total Protein: 7.6 g/dL (ref 6.0–8.3)

## 2013-08-04 LAB — URINALYSIS, ROUTINE W REFLEX MICROSCOPIC
BILIRUBIN URINE: NEGATIVE
Glucose, UA: NEGATIVE mg/dL
Hgb urine dipstick: NEGATIVE
Ketones, ur: >80 mg/dL — AB
Leukocytes, UA: NEGATIVE
Nitrite: NEGATIVE
PH: 6 (ref 5.0–8.0)
Protein, ur: NEGATIVE mg/dL
SPECIFIC GRAVITY, URINE: 1.024 (ref 1.005–1.030)
UROBILINOGEN UA: 0.2 mg/dL (ref 0.0–1.0)

## 2013-08-04 LAB — CBC WITH DIFFERENTIAL/PLATELET
Basophils Absolute: 0 10*3/uL (ref 0.0–0.1)
Basophils Relative: 0 % (ref 0–1)
EOS PCT: 1 % (ref 0–5)
Eosinophils Absolute: 0.1 10*3/uL (ref 0.0–0.7)
HEMATOCRIT: 35.4 % — AB (ref 36.0–46.0)
Hemoglobin: 11 g/dL — ABNORMAL LOW (ref 12.0–15.0)
LYMPHS ABS: 1.6 10*3/uL (ref 0.7–4.0)
LYMPHS PCT: 26 % (ref 12–46)
MCH: 22.8 pg — ABNORMAL LOW (ref 26.0–34.0)
MCHC: 31.1 g/dL (ref 30.0–36.0)
MCV: 73.3 fL — AB (ref 78.0–100.0)
MONO ABS: 0.3 10*3/uL (ref 0.1–1.0)
Monocytes Relative: 4 % (ref 3–12)
Neutro Abs: 4.1 10*3/uL (ref 1.7–7.7)
Neutrophils Relative %: 69 % (ref 43–77)
Platelets: 382 10*3/uL (ref 150–400)
RBC: 4.83 MIL/uL (ref 3.87–5.11)
RDW: 15.1 % (ref 11.5–15.5)
WBC: 6 10*3/uL (ref 4.0–10.5)

## 2013-08-04 LAB — I-STAT TROPONIN, ED: Troponin i, poc: 0 ng/mL (ref 0.00–0.08)

## 2013-08-04 LAB — LIPASE, BLOOD: Lipase: 24 U/L (ref 11–59)

## 2013-08-04 LAB — POC URINE PREG, ED: Preg Test, Ur: NEGATIVE

## 2013-08-04 MED ORDER — PANTOPRAZOLE SODIUM 20 MG PO TBEC: 20.0000 mg | DELAYED_RELEASE_TABLET | Freq: Every day | ORAL | Status: DC

## 2013-08-04 MED ORDER — GI COCKTAIL ~~LOC~~
30.0000 mL | Freq: Once | ORAL | Status: AC
  Administered 2013-08-04: 30 mL via ORAL
  Filled 2013-08-04: qty 30

## 2013-08-04 NOTE — ED Notes (Signed)
Pt c/o upper abd pain that started 4 days ago.  Pt states little nausea but denies v/d/ or constipations.  Pt states that she has started back exercising and had a c-section 7 months ago and questioning if they could be related.

## 2013-08-04 NOTE — ED Provider Notes (Signed)
CSN: [id number]     Arrival date & time 08/04/13  1533 History   First MD Initiated Contact with Patient 08/04/13 1553     Chief Complaint  Patient presents with  . Abdominal Pain  . Nausea     (Consider location/radiation/quality/duration/timing/severity/associated sxs/prior Treatment) Patient is a 28 y.o. female presenting with abdominal pain. The history is provided by the patient.  Abdominal Pain Pain location:  Epigastric Pain quality: aching and sharp   Pain radiates to:  Does not radiate Pain severity:  Mild Onset quality:  Gradual Duration:  4 days Timing:  Intermittent Progression:  Unchanged Chronicity:  New Context comment:  After increased ab exercises Relieved by:  Nothing Exacerbated by: unsure, movement of torso does reproduce abd pain. Ineffective treatments:  None tried Associated symptoms: no chest pain, no cough, no diarrhea, no dysuria, no fatigue, no fever, no hematuria, no nausea, no shortness of breath and no vomiting     Past Medical History  Diagnosis Date  . Headache(784.0)   . Medical history non-contributory   . Infection     UTI   Past Surgical History  Procedure Laterality Date  . No past surgeries    . Cesarean section N/A 12/11/2012    Procedure: CESAREAN SECTION;  Surgeon: Geryl RankinsEvelyn Varnado, MD;  Location: [facility];  Service: Obstetrics;  Laterality: N/A;   Family History  Problem Relation Age of Onset  . Cancer Paternal Aunt     ? brain  . Cancer Paternal Grandmother     breast  . Hypertension Mother    History  Substance Use Topics  . Smoking status: Never Smoker   . Smokeless tobacco: Not on file  . Alcohol Use: No   OB History   Grav Para Term Preterm Abortions TAB SAB Ect Mult Living   1 1 1  0 0 0 0 0 0 1     Review of Systems  Constitutional: Negative for fever and fatigue.  HENT: Negative for congestion and drooling.   Eyes: Negative for pain.  Respiratory: Negative for cough and shortness of breath.    Cardiovascular: Negative for chest pain.  Gastrointestinal: Negative for nausea, vomiting, abdominal pain and diarrhea.  Genitourinary: Negative for dysuria and hematuria.  Musculoskeletal: Negative for back pain, gait problem and neck pain.  Skin: Negative for color change.  Neurological: Negative for dizziness and headaches.  Hematological: Negative for adenopathy.  Psychiatric/Behavioral: Negative for behavioral problems.  All other systems reviewed and are negative.     Allergies  Review of patient's allergies indicates no known allergies.  Home Medications   Prior to Admission medications   Not on File   Pulse 92  Temp(Src) 98.4 F (36.9 C) (Oral)  Resp 18  SpO2 100%  LMP 07/26/2013 Physical Exam  Nursing note and vitals reviewed. Constitutional: She is oriented to person, place, and time. She appears well-developed and well-nourished.  HENT:  Head: Normocephalic.  Mouth/Throat: Oropharynx is clear and moist. No oropharyngeal exudate.  Eyes: Conjunctivae and EOM are normal. Pupils are equal, round, and reactive to light.  Neck: Normal range of motion. Neck supple.  Cardiovascular: Normal rate, regular rhythm, normal heart sounds and intact distal pulses.  Exam reveals no gallop and no friction rub.   No murmur heard. Pulmonary/Chest: Effort normal and breath sounds normal. No respiratory distress. She has no wheezes.  Abdominal: Soft. Bowel sounds are normal. There is tenderness (Mild tenderness to palpation of the epigastric area.). There is no rebound and no guarding.  Musculoskeletal: Normal range of motion. She exhibits no edema and no tenderness.  Neurological: She is alert and oriented to person, place, and time.  Skin: Skin is warm and dry.  Psychiatric: She has a normal mood and affect. Her behavior is normal.    ED Course  Procedures (including critical care time) Labs Review Labs Reviewed  CBC WITH DIFFERENTIAL - Abnormal; Notable for the following:     Hemoglobin 11.0 (*)    HCT 35.4 (*)    MCV 73.3 (*)    MCH 22.8 (*)    All other components within normal limits  URINALYSIS, ROUTINE W REFLEX MICROSCOPIC - Abnormal; Notable for the following:    APPearance CLOUDY (*)    Ketones, ur >80 (*)    All other components within normal limits  COMPREHENSIVE METABOLIC PANEL  LIPASE, BLOOD  I-STAT TROPOININ, ED  POC URINE PREG, ED    Imaging Review [place] Abdomen Limited Ruq  08/04/2013   CLINICAL DATA:  Epigastric and RIGHT upper quadrant pain, nausea, question cholecystitis  EXAM: US ABDOMEN LIMITED - RIGHT UPPER QUADRANT  COMPARISON:  05/18/2012  FINDINGS: Gallbladder:  Contracted, patient not NPO prior to ultrasound exam. No definite gallbladder wall thickening or calculi. No pericholecystic fluid or sonographic Murphy sign.  Common bile duct:  Diameter: Normal caliber 2 mm diameter  Liver:  Normal appearance.  Hepatopetal portal venous flow.  No RIGHT upper quadrant free fluid.  IMPRESSION: Contracted gallbladder which could potentially be related to food intake, as patient was not NPO prior to study.  Otherwise negative exam.   Electronically Signed   By: Ulyses SouthwardMark  Boles M.D.   On: 08/04/2013 16:59     EKG Interpretation   Date/Time:  Friday August 04 2013 15:46:24 EDT Ventricular Rate:  94 PR Interval:  153 QRS Duration: 81 QT Interval:  351 QTC Calculation: 439 R Axis:   11 Text Interpretation:  Sinus rhythm No significant change since last  tracing Confirmed by Deanie Jupiter  MD, Tyeesha Riker ([id number]) on 08/04/2013 4:10:04 PM      MDM   Final diagnoses:  Abdominal pain    4:49 PM 28 y.o. female who presents with 4 days of intermittent sharp epigastric pains. She notes that she had a C-section approximately 7 months ago. She states that she has been working out more in the last 2 weeks including a lot of and exercises. She denies any pain when she eats. She denies any fevers, vomiting, or diarrhea. She is afebrile and vital signs are unremarkable  here. She's not had the pain on exam currently. Likely musculoskeletal but will get screening labwork and ultrasound of right upper quadrant. She does not want any pain medicine. Does have occasional reflux, feels some mild burning sensation on exam in epig area. Will give gi cocktail.   6:14 PM: Pt continues to appear well. I interpreted/reviewed the labs and/or imaging which were non-contributory.  Likely msk d/t inc ab exercises. Will start on ppi d/t burning sensation.  I have discussed the diagnosis/risks/treatment options with the patient and believe the pt to be eligible for discharge home to follow-up with pcp as needed. We also discussed returning to the ED immediately if new or worsening sx occur. We discussed the sx which are most concerning (e.g., worsening pain, fever, vomiting) that necessitate immediate return. Medications administered to the patient during their visit and any new prescriptions provided to the patient are listed below.  Medications given during this visit Medications  gi cocktail (Maalox,Lidocaine,Donnatal) (30 mLs  Oral Given 08/04/13 1651)    New Prescriptions   PANTOPRAZOLE (PROTONIX) 20 MG TABLET    Take 1 tablet (20 mg total) by mouth daily.     Junius Argyle, MD 08/04/13 [phone]

## 2013-08-04 NOTE — Discharge Instructions (Signed)
Abdominal Pain, Women °Abdominal (stomach, pelvic, or belly) pain can be caused by many things. It is important to tell your doctor: °· The location of the pain. °· Does it come and go or is it present all the time? °· Are there things that start the pain (eating certain foods, exercise)? °· Are there other symptoms associated with the pain (fever, nausea, vomiting, diarrhea)? °All of this is helpful to know when trying to find the cause of the pain. °CAUSES  °· Stomach: virus or bacteria infection, or ulcer. °· Intestine: appendicitis (inflamed appendix), regional ileitis (Crohn's disease), ulcerative colitis (inflamed colon), irritable bowel syndrome, diverticulitis (inflamed diverticulum of the colon), or cancer of the stomach or intestine. °· Gallbladder disease or stones in the gallbladder. °· Kidney disease, kidney stones, or infection. °· Pancreas infection or cancer. °· Fibromyalgia (pain disorder). °· Diseases of the female organs: °· Uterus: fibroid (non-cancerous) tumors or infection. °· Fallopian tubes: infection or tubal pregnancy. °· Ovary: cysts or tumors. °· Pelvic adhesions (scar tissue). °· Endometriosis (uterus lining tissue growing in the pelvis and on the pelvic organs). °· Pelvic congestion syndrome (female organs filling up with blood just before the menstrual period). °· Pain with the menstrual period. °· Pain with ovulation (producing an egg). °· Pain with an IUD (intrauterine device, birth control) in the uterus. °· Cancer of the female organs. °· Functional pain (pain not caused by a disease, may improve without treatment). °· Psychological pain. °· Depression. °DIAGNOSIS  °Your doctor will decide the seriousness of your pain by doing an examination. °· Blood tests. °· X-rays. °· Ultrasound. °· CT scan (computed tomography, special type of X-ray). °· MRI (magnetic resonance imaging). °· Cultures, for infection. °· Barium enema (dye inserted in the large intestine, to better view it with  X-rays). °· Colonoscopy (looking in intestine with a lighted tube). °· Laparoscopy (minor surgery, looking in abdomen with a lighted tube). °· Major abdominal exploratory surgery (looking in abdomen with a large incision). °TREATMENT  °The treatment will depend on the cause of the pain.  °· Many cases can be observed and treated at home. °· Over-the-counter medicines recommended by your caregiver. °· Prescription medicine. °· Antibiotics, for infection. °· Birth control pills, for painful periods or for ovulation pain. °· Hormone treatment, for endometriosis. °· Nerve blocking injections. °· Physical therapy. °· Antidepressants. °· Counseling with a psychologist or psychiatrist. °· Minor or major surgery. °HOME CARE INSTRUCTIONS  °· Do not take laxatives, unless directed by your caregiver. °· Take over-the-counter pain medicine only if ordered by your caregiver. Do not take aspirin because it can cause an upset stomach or bleeding. °· Try a clear liquid diet (broth or water) as ordered by your caregiver. Slowly move to a bland diet, as tolerated, if the pain is related to the stomach or intestine. °· Have a thermometer and take your temperature several times a day, and record it. °· Bed rest and sleep, if it helps the pain. °· Avoid sexual intercourse, if it causes pain. °· Avoid stressful situations. °· Keep your follow-up appointments and tests, as your caregiver orders. °· If the pain does not go away with medicine or surgery, you may try: °· Acupuncture. °· Relaxation exercises (yoga, meditation). °· Group therapy. °· Counseling. °SEEK MEDICAL CARE IF:  °· You notice certain foods cause stomach pain. °· Your home care treatment is not helping your pain. °· You need stronger pain medicine. °· You want your IUD removed. °· You feel faint or   lightheaded. °· You develop nausea and vomiting. °· You develop a rash. °· You are having side effects or an allergy to your medicine. °SEEK IMMEDIATE MEDICAL CARE IF:  °· Your  pain does not go away or gets worse. °· You have a fever. °· Your pain is felt only in portions of the abdomen. The right side could possibly be appendicitis. The left lower portion of the abdomen could be colitis or diverticulitis. °· You are passing blood in your stools (bright red or black tarry stools, with or without vomiting). °· You have blood in your urine. °· You develop chills, with or without a fever. °· You pass out. °MAKE SURE YOU:  °· Understand these instructions. °· Will watch your condition. °· Will get help right away if you are not doing well or get worse. °Document Released: 12/07/2006 Document Revised: 05/04/2011 Document Reviewed: 12/27/2008 °[employer]® Patient Information ©2014 [employer]. ° °

## 2013-09-01 ENCOUNTER — Encounter (HOSPITAL_COMMUNITY): Payer: Self-pay | Admitting: Emergency Medicine

## 2013-09-01 ENCOUNTER — Emergency Department (HOSPITAL_COMMUNITY)
Admission: EM | Admit: 2013-09-01 | Discharge: 2013-09-02 | Disposition: A | Discharge status: Home / Self Care | Payer: No Typology Code available for payment source | Attending: Emergency Medicine | Admitting: Emergency Medicine

## 2013-09-01 DIAGNOSIS — Z3202 Encounter for pregnancy test, result negative: Secondary | ICD-10-CM | POA: Insufficient documentation

## 2013-09-01 DIAGNOSIS — Z9889 Other specified postprocedural states: Secondary | ICD-10-CM | POA: Insufficient documentation

## 2013-09-01 DIAGNOSIS — K2990 Gastroduodenitis, unspecified, without bleeding: Principal | ICD-10-CM

## 2013-09-01 DIAGNOSIS — K2970 Gastritis, unspecified, without bleeding: Secondary | ICD-10-CM | POA: Insufficient documentation

## 2013-09-01 DIAGNOSIS — Z79899 Other long term (current) drug therapy: Secondary | ICD-10-CM | POA: Insufficient documentation

## 2013-09-01 DIAGNOSIS — Z87440 Personal history of urinary (tract) infections: Secondary | ICD-10-CM | POA: Insufficient documentation

## 2013-09-01 NOTE — ED Notes (Signed)
Pt c/o mid abd pain x 1 month. +dizziness, +nausea. Denies emesis, denies diarrhea. Denies urinary s/s. +vaginal discharge, white, denies odor.

## 2013-09-02 LAB — URINALYSIS, ROUTINE W REFLEX MICROSCOPIC
Bilirubin Urine: NEGATIVE
GLUCOSE, UA: NEGATIVE mg/dL
HGB URINE DIPSTICK: NEGATIVE
Ketones, ur: NEGATIVE mg/dL
Leukocytes, UA: NEGATIVE
Nitrite: NEGATIVE
Protein, ur: NEGATIVE mg/dL
SPECIFIC GRAVITY, URINE: 1.026 (ref 1.005–1.030)
Urobilinogen, UA: 0.2 mg/dL (ref 0.0–1.0)
pH: 7.5 (ref 5.0–8.0)

## 2013-09-02 LAB — HEPATIC FUNCTION PANEL
ALBUMIN: 3.9 g/dL (ref 3.5–5.2)
ALT: 8 U/L (ref 0–35)
AST: 12 U/L (ref 0–37)
Alkaline Phosphatase: 68 U/L (ref 39–117)
Bilirubin, Direct: <0.2 mg/dL (ref 0.0–0.3)
Total Bilirubin: <0.2 mg/dL — ABNORMAL LOW (ref 0.3–1.2)
Total Protein: 7.5 g/dL (ref 6.0–8.3)

## 2013-09-02 LAB — LIPASE, BLOOD: LIPASE: 29 U/L (ref 11–59)

## 2013-09-02 LAB — PREGNANCY, URINE: PREG TEST UR: NEGATIVE

## 2013-09-02 MED ORDER — FAMOTIDINE 20 MG PO TABS
20.0000 mg | ORAL_TABLET | Freq: Once | ORAL | Status: AC
  Administered 2013-09-02: 20 mg via ORAL
  Filled 2013-09-02: qty 1

## 2013-09-02 MED ORDER — SODIUM CHLORIDE 0.9 % IV BOLUS (SEPSIS)
1000.0000 mL | Freq: Once | INTRAVENOUS | Status: AC
  Administered 2013-09-02: 1000 mL via INTRAVENOUS

## 2013-09-02 MED ORDER — FAMOTIDINE 20 MG PO TABS: 20.0000 mg | ORAL_TABLET | Freq: Two times a day (BID) | ORAL | Status: DC

## 2013-09-02 MED ORDER — GI COCKTAIL ~~LOC~~
30.0000 mL | Freq: Once | ORAL | Status: AC
  Administered 2013-09-02: 30 mL via ORAL
  Filled 2013-09-02: qty 30

## 2013-09-02 NOTE — ED Provider Notes (Signed)
CSN: [id number]     Arrival date & time 09/01/13  2330 History   First MD Initiated Contact with Patient 09/02/13 0000     Chief Complaint  Patient presents with  . Abdominal Pain     (Consider location/radiation/quality/duration/timing/severity/associated sxs/prior Treatment) HPI Comments: 28 year old female with a past history of a cesarean section but no other significant medical problems who presents with a complaint of one month of abdominal pain. The pain is described as a burning sensation located in the upper abdomen, epigastrium with radiation to the bilateral sides and back. This has been gradually getting worse over the month, gets better immediately with Pepto-Bismol, TUMS though the pain does return after approximately 30 minutes. She denies any fevers chills or lower back pain and has no dysuria, hematuria, diarrhea. At this time her symptoms are moderate. Review of the medical records shows that the patient had an ultrasound of her upper quadrant one month ago which revealed no signs of biliary disease, her lab work at that time was unremarkable as well.  Patient is a 28 y.o. female presenting with abdominal pain. The history is provided by the patient and medical records.  Abdominal Pain   Past Medical History  Diagnosis Date  . Headache(784.0)   . Medical history non-contributory   . Infection     UTI   Past Surgical History  Procedure Laterality Date  . No past surgeries    . Cesarean section N/A 12/11/2012    Procedure: CESAREAN SECTION;  Surgeon: Geryl RankinsEvelyn Varnado, MD;  Location: [facility];  Service: Obstetrics;  Laterality: N/A;   Family History  Problem Relation Age of Onset  . Cancer Paternal Aunt     ? brain  . Cancer Paternal Grandmother     breast  . Hypertension Mother    History  Substance Use Topics  . Smoking status: Never Smoker   . Smokeless tobacco: Not on file  . Alcohol Use: No   OB History   Grav Para Term Preterm Abortions TAB SAB Ect Mult  Living   1 1 1  0 0 0 0 0 0 1     Review of Systems  Gastrointestinal: Positive for abdominal pain.  All other systems reviewed and are negative.     Allergies  Review of patient's allergies indicates no known allergies.  Home Medications   Prior to Admission medications   Medication Sig Start Date End Date Taking? Authorizing Provider  famotidine (PEPCID) 20 MG tablet Take 1 tablet (20 mg total) by mouth 2 (two) times daily. 09/02/13   Vida RollerBrian D Kaine Mcquillen, MD   BP 108/72  Pulse 78  Temp(Src) 98.5 F (36.9 C) (Oral)  Resp 16  Ht 4\' 11"  (1.499 m)  Wt 145 lb (65.772 kg)  BMI 29.27 kg/m2  SpO2 100%  LMP 08/22/2013  Breastfeeding? No Physical Exam  Nursing note and vitals reviewed. Constitutional: She appears well-developed and well-nourished. No distress.  HENT:  Head: Normocephalic and atraumatic.  Mouth/Throat: Oropharynx is clear and moist. No oropharyngeal exudate.  Eyes: Conjunctivae and EOM are normal. Pupils are equal, round, and reactive to light. Right eye exhibits no discharge. Left eye exhibits no discharge. No scleral icterus.  Neck: Normal range of motion. Neck supple. No JVD present. No thyromegaly present.  Cardiovascular: Normal rate, regular rhythm, normal heart sounds and intact distal pulses.  Exam reveals no gallop and no friction rub.   No murmur heard. Pulmonary/Chest: Effort normal and breath sounds normal. No respiratory distress. She has no  wheezes. She has no rales.  Abdominal: Soft. Bowel sounds are normal. She exhibits no distension and no mass. There is tenderness (Mild epigastric tenderness, no guarding, no lower abdominal tenderness).  Musculoskeletal: Normal range of motion. She exhibits no edema and no tenderness.  Lymphadenopathy:    She has no cervical adenopathy.  Neurological: She is alert. Coordination normal.  Skin: Skin is warm and dry. No rash noted. No erythema.  Psychiatric: She has a normal mood and affect. Her behavior is normal.     ED Course  Procedures (including critical care time) Labs Review Labs Reviewed  URINALYSIS, ROUTINE W REFLEX MICROSCOPIC - Abnormal; Notable for the following:    APPearance CLOUDY (*)    All other components within normal limits  HEPATIC FUNCTION PANEL - Abnormal; Notable for the following:    Total Bilirubin <0.2 (*)    All other components within normal limits  PREGNANCY, URINE  LIPASE, BLOOD    Imaging Review No results found.    MDM   Final diagnoses:  Gastritis    Gastritis or peptic ulcer disease. The patient does have a history of using frequent NSAIDs though not daily. Will provide medications to treat above disease, evaluate with lipase and hepatic function though doubt this is worsening.  Labs neg, pt improved significantly with GI cocktail - likely gastritis, pt informed, stable for d/c  Meds given in ED:  Medications  gi cocktail (Maalox,Lidocaine,Donnatal) (30 mLs Oral Given 09/02/13 0135)  famotidine (PEPCID) tablet 20 mg (20 mg Oral Given 09/02/13 0134)  sodium chloride 0.9 % bolus 1,000 mL (1,000 mLs Intravenous New Bag/Given 09/02/13 0030)    New Prescriptions   FAMOTIDINE (PEPCID) 20 MG TABLET    Take 1 tablet (20 mg total) by mouth 2 (two) times daily.      Vida Roller, MD 09/02/13 [phone]

## 2013-09-02 NOTE — Discharge Instructions (Signed)
°Emergency Department Resource Guide °1) Find a Doctor and Pay Out of Pocket °Although you won't have to find out who is covered by your insurance plan, it is a good idea to ask around and get recommendations. You will then need to call the office and see if the doctor you have chosen will accept you as a new patient and what types of options they offer for patients who are self-pay. Some doctors offer discounts or will set up payment plans for their patients who do not have insurance, but you will need to ask so you aren't surprised when you get to your appointment. ° °2) Contact Your Local Health Department °Not all health departments have doctors that can see patients for sick visits, but many do, so it is worth a call to see if yours does. If you don't know where your local health department is, you can check in your phone book. The CDC also has a tool to help you locate your state's health department, and many state websites also have listings of all of their local health departments. ° °3) Find a Walk-in Clinic °If your illness is not likely to be very severe or complicated, you may want to try a walk in clinic. These are popping up all over the country in pharmacies, drugstores, and shopping centers. They're usually staffed by nurse practitioners or physician assistants that have been trained to treat common illnesses and complaints. They're usually fairly quick and inexpensive. However, if you have serious medical issues or chronic medical problems, these are probably not your best option. ° °No Primary Care Doctor: °- Call [facility] at  832-8000 - they can help you locate a primary care doctor that  accepts your insurance, provides certain services, etc. °- Physician Referral Service- 1-800-533-3463 ° °Chronic Pain Problems: °Organization         Address  Phone   Notes  °[facility]  (336) 297-2271 Patients need to be referred by their primary care doctor.  ° °Medication  Assistance: °Organization         Address  Phone   Notes  °[place] [facility] [address] °[place], [place] [address] (336) 641-8030 --Must be a resident of [place] °-- Must have NO insurance coverage whatsoever (no Medicaid/ Medicare, etc.) °-- The pt. MUST have a primary care doctor that directs their care regularly and follows them in the community °  °[facility]  (866) 331-1348   °[employer]  (888) 892-1162   ° °Agencies that provide inexpensive medical care: °Organization         Address  Phone   Notes  °[facility]  (336) 832-8035   °[facility]    (336) 832-7272   °[facility] [address] °[place], [place] [address] (336) 832-4777   °[facility] [address], °[place] (336) 271-4999   °[facility]    (336) 373-0678   °[facility]    (336) 272-1050   °[facility] ° [address], [place] Phone:  (336) 832-4444, Fax:  (336) 832-4440 Hours of Operation:  9 am - 6 pm, M-F.  Also accepts Medicaid/Medicare and self-pay.  °[facility] ° [address], [place] Phone: (336) 832-3150, Fax: (336) [phone]. Hours of Operation:  8:30 am - 5:30 pm, M-F.  Also accepts Medicaid and self-pay.  °[facility] [place] [address], [place] Phone: (336) 878-6027   °[facility] [address], [place], [place] (336)723-1848, Ext. 123 Mondays & Thursdays: 7-9 AM.  First 15 patients are seen on a first come, first serve basis. °  ° °Medicaid-accepting [place] Providers: ° °Organization         Address  Phone   Notes  °[facility] [address], [place] (336) 641-2100 Also accepts self-pay patients.  °[facility] [address], [place] ° (336) 856-9996   °[facility] [address], [place]  (336) 288-8857   °[facility] [address] [place] [address], [place] (336) 299-7000   °Veita Bland [address], [place]  ° (336) 373-1557 Only accepts [place] Access Medicaid patients after they have their name applied to their card.  ° °Self-Pay (no insurance) in [place]: ° °Organization         Address  Phone   Notes  °Sickle Cell Patients, [facility] [address], [place] (336) 832-1970   °[facility] [address], [place] (336) 832-4400   °[facility] ° [address], [place] (336) 992-4800   °[facility]/Dr. Osei-Bonsu ° [address] [place] [address], [place] or [address], [place] (336) 841-8500 Phone number for both [place] and [place] locations is the same.  °[facility] [address], [place] (336) 299-0000   °[facility] [address] [place] [address], [place] or [address] (336) 852-7530 °(336) 878-2260   °[facility] [address], [place] (336) 350-1642, phone; (336) 294-5005, fax Sees patients 1st and 3rd Saturday of every month.  Must not qualify for public or private insurance (i.e. Medicaid, Medicare, [place] Health Choice, Veterans' Benefits) • Household income should be no more than 200% of the poverty level •The clinic cannot treat you if you are pregnant or think you are pregnant • Sexually transmitted diseases are not treated at the clinic.  ° ° °Dental Care: °Organization         Address  Phone  Notes  °[place] [facility] [address], [place] (336) 641-6152 Accepts children up to age 21 who are enrolled in Medicaid or [place] Health Choice; pregnant women with a Medicaid card; and children who have applied for Medicaid or [place] Health Choice, but were declined, whose parents can pay a reduced fee at time of service.  °[place]  [facility] [place]  [address], [place] (336) 641-7733 Accepts children up to age 21 who are enrolled in Medicaid or [place] Health Choice; pregnant women with a Medicaid card; and children who have applied for Medicaid or [place] Health Choice, but were declined, whose parents can pay a reduced fee at time of service.  °[facility] ° [address], [place] (336) 641-4533 Patients are seen by appointment only. Walk-ins are not accepted. [facility] will see patients 18 years of age and older. °Monday - Tuesday (8am-5pm) °Most Wednesdays (8:30-5pm) °$30 per visit, cash only  °[facility] ° [address], [place] (336) 641-4533 Patients are seen by appointment only. Walk-ins are not accepted. [facility] will see patients 18 years of age and older. °One   Wednesday Evening (Monthly: Volunteer Based).  $30 per visit, cash only  °[facility]  (919) 537-3737 for adults; Children under age 4, call Graduate Pediatric Dentistry at (919) [phone]. Children aged 4-14, please call (919) [phone] to request a pediatric application. ° Dental services are provided in all areas of dental care including fillings, crowns and bridges, complete and partial dentures, implants, gum treatment, root canals, and extractions. Preventive care is also provided. Treatment is provided to both adults and children. °Patients are selected via a lottery and there is often a waiting list. °  °[facility] [address], °[place] ° (336) 763-8833 [email or web address] °  °[facility] [address], [place], [place] (336)723-1848, Ext. 123 Second and Fourth Thursday of each month, opens at 6:30 AM; Clinic ends at 9 AM.  Patients are seen on a first-come first-served basis, and a limited number are seen during each clinic.  ° °[facility] ° [address], [place], [place] (336) 723-7904    Eligibility Requirements °You must have lived in [place], [place], or [place] counties for at least the last three months. °  You cannot be eligible for state or federal sponsored healthcare insurance, including Veterans Administration, Medicaid, or Medicare. °  You generally cannot be eligible for healthcare insurance through your employer.  °  How to apply: °Eligibility screenings are held every Tuesday and Wednesday afternoon from 1:00 pm until 4:00 pm. You do not need an appointment for the interview!  °[facility] [address], [place], [place] 336-631-2330   °[facility]  336-342-8273   °[facility]  336-703-3100   °[facility]  336-570-6415   ° °Behavioral Health Resources in the Community: °Intensive Outpatient Programs °Organization         Address  Phone  Notes  °[facility] [address], [place], [place] 336-878-6098   °[facility] [address], [place], [place] 336-832-9800   °[facility] [address], [place], [place] ° 336-882-2125   °[facility] [address],  °[place], [place] 1-800-853-5163 or 336-641-4981   °Substance Abuse Resources °Organization         Address  Phone  Notes  °[facility]  336-882-2125   °[facility]  336-784-9470   °[facility]  336-285-9073   °[facility]  336-845-3988   °Residential & Outpatient Substance Abuse Program  1-800-659-3381   °Psychological Services °Organization         Address  Phone  Notes  °[facility]  336- 832-9600   °[facility]  336- 378-7881   °[facility] [address], [place] 1-800-853-5163 or 336-641-4981   ° °Mobile Crisis Teams °Organization         Address  Phone  Notes  °[facility], Mobile Crisis Care Unit  1-877-626-1772   °Assertive °[facility] ° [address]  [place], [place] 336-834-9664   °Sharon DeEsch [address] °[place]  336-554-5454   ° °Self-Help/Support Groups °Organization         Address  Phone             Notes  °[facility] [place] - variety of support groups  336- 373-1402 Call for more information  °Narcotics Anonymous (NA), [facility] [address], °[place]   2 meetings at this location  ° °  Residential Treatment Programs °Organization         Address  Phone  Notes  °[facility] [address],    °[place]  1-866-801-8205   °[facility] ° [address], [place], [place] 704-293-8524   °[facility] [address], [place] 336-845-3988 Admissions: 8am-3pm M-F  °[facility] [address]    °[place], [place] 336-841-1104   °[facility] [address], [place], [place] 336-379-7146   °[facility] [address]  °[place], [place] 336-285-9073   °[facility] - Intensive Outpatient [address], [place], [place] 336-852-3033   °[facility] [address]  °[place], [place] 1-877-615-2722 or 336-784-9470   °[facility] [address] [place], [place] 336-227-7417 Accepts Medicaid  °[facility] [address]  °[place] 1-800-659-3381 Substance Abuse/Addiction Treatment  ° °[place] Behavioral Health Resources °Organization         Address  Phone  Notes  °[facility]  (888) 581-9988   °Julie Brannon, PhD [address] [place], [place]   (336) 349-5553 or (336) 951-0000   °[facility]   [address] °[place], [place] (336) 349-4454   °[facility] [address], [place], [place] (336) 342-8316 Insurance/Medicaid/sponsorship through [facility]  °[facility] [address]                                    [place], [place] (336) 342-8316 Therapy/tele-psych/case    °[facility] [address]  ° [place], [place] (336) 349-2233    °Dr. Arfeen  (336) 349-4544   °[facility] [place]  [employer] [place] [facility] 1) [address], [place] °2) [address], [place] °3)  [address], [place] (336) 349-3220 °(336) 342-7768 ° °(336) 342-8140   °[place] [facility] (336) 342-1394 or (336) 342-3537 (After Hours)    ° ° °

## 2013-12-25 ENCOUNTER — Encounter (HOSPITAL_COMMUNITY): Payer: Self-pay | Admitting: Emergency Medicine

## 2013-12-28 ENCOUNTER — Emergency Department (HOSPITAL_COMMUNITY): Payer: Medicaid Other

## 2013-12-28 ENCOUNTER — Emergency Department (HOSPITAL_COMMUNITY)
Admission: EM | Admit: 2013-12-28 | Discharge: 2013-12-29 | Disposition: A | Discharge status: Home / Self Care | Payer: Medicaid Other | Attending: Emergency Medicine | Admitting: Emergency Medicine

## 2013-12-28 ENCOUNTER — Encounter (HOSPITAL_COMMUNITY): Payer: Self-pay | Admitting: Emergency Medicine

## 2013-12-28 DIAGNOSIS — N939 Abnormal uterine and vaginal bleeding, unspecified: Secondary | ICD-10-CM | POA: Insufficient documentation

## 2013-12-28 DIAGNOSIS — R079 Chest pain, unspecified: Secondary | ICD-10-CM | POA: Insufficient documentation

## 2013-12-28 DIAGNOSIS — Z87440 Personal history of urinary (tract) infections: Secondary | ICD-10-CM | POA: Insufficient documentation

## 2013-12-28 DIAGNOSIS — B3731 Acute candidiasis of vulva and vagina: Secondary | ICD-10-CM

## 2013-12-28 DIAGNOSIS — R0789 Other chest pain: Secondary | ICD-10-CM

## 2013-12-28 DIAGNOSIS — Z3202 Encounter for pregnancy test, result negative: Secondary | ICD-10-CM | POA: Insufficient documentation

## 2013-12-28 DIAGNOSIS — B373 Candidiasis of vulva and vagina: Secondary | ICD-10-CM | POA: Insufficient documentation

## 2013-12-28 DIAGNOSIS — R110 Nausea: Secondary | ICD-10-CM | POA: Insufficient documentation

## 2013-12-28 DIAGNOSIS — Z79899 Other long term (current) drug therapy: Secondary | ICD-10-CM | POA: Insufficient documentation

## 2013-12-28 LAB — BASIC METABOLIC PANEL
ANION GAP: 13 (ref 5–15)
BUN: 11 mg/dL (ref 6–23)
CALCIUM: 9.4 mg/dL (ref 8.4–10.5)
CO2: 23 mEq/L (ref 19–32)
Chloride: 103 mEq/L (ref 96–112)
Creatinine, Ser: 0.67 mg/dL (ref 0.50–1.10)
GFR calc Af Amer: >90 mL/min (ref >90)
Glucose, Bld: 71 mg/dL (ref 70–99)
POTASSIUM: 4.3 meq/L (ref 3.7–5.3)
SODIUM: 139 meq/L (ref 137–147)

## 2013-12-28 LAB — URINALYSIS, ROUTINE W REFLEX MICROSCOPIC
BILIRUBIN URINE: NEGATIVE
GLUCOSE, UA: NEGATIVE mg/dL
Hgb urine dipstick: NEGATIVE
KETONES UR: NEGATIVE mg/dL
Leukocytes, UA: NEGATIVE
Nitrite: NEGATIVE
Protein, ur: NEGATIVE mg/dL
Specific Gravity, Urine: 1.024 (ref 1.005–1.030)
Urobilinogen, UA: 0.2 mg/dL (ref 0.0–1.0)
pH: 8 (ref 5.0–8.0)

## 2013-12-28 LAB — URINE MICROSCOPIC-ADD ON

## 2013-12-28 LAB — PREGNANCY, URINE: PREG TEST UR: NEGATIVE

## 2013-12-28 LAB — CBC
HCT: 35.4 % — ABNORMAL LOW (ref 36.0–46.0)
HEMOGLOBIN: 10.9 g/dL — AB (ref 12.0–15.0)
MCH: 22.9 pg — AB (ref 26.0–34.0)
MCHC: 30.8 g/dL (ref 30.0–36.0)
MCV: 74.4 fL — ABNORMAL LOW (ref 78.0–100.0)
PLATELETS: 354 10*3/uL (ref 150–400)
RBC: 4.76 MIL/uL (ref 3.87–5.11)
RDW: 14.6 % (ref 11.5–15.5)
WBC: 6.5 10*3/uL (ref 4.0–10.5)

## 2013-12-28 LAB — I-STAT TROPONIN, ED: Troponin i, poc: 0 ng/mL (ref 0.00–0.08)

## 2013-12-28 LAB — WET PREP, GENITAL: Trich, Wet Prep: NONE SEEN

## 2013-12-28 LAB — PRO B NATRIURETIC PEPTIDE: PRO B NATRI PEPTIDE: 23.8 pg/mL (ref 0–125)

## 2013-12-28 MED ORDER — ACETAMINOPHEN 500 MG PO TABS
1000.0000 mg | ORAL_TABLET | Freq: Once | ORAL | Status: DC
  Filled 2013-12-28: qty 2

## 2013-12-28 NOTE — ED Notes (Addendum)
PA at bedside.

## 2013-12-28 NOTE — ED Notes (Signed)
Pt. reports intermittent mis chest pain with mild SOB and nausea onset last week , mid abdominal cramping with spotting onset 4 days ago .

## 2013-12-28 NOTE — ED Provider Notes (Signed)
CSN: [id number]     Arrival date & time 12/28/13  1848 History   First MD Initiated Contact with Patient 12/28/13 2046     Chief Complaint  Patient presents with  . Chest Pain  . Abdominal Pain     (Consider location/radiation/quality/duration/timing/severity/associated sxs/prior Treatment) HPI Jackie Brooks is a 28 year old female with no significant past medical history who presents the ER with 2 weeks of intermittent chest pain, and 3 days of abdominal cramping and vaginal spotting. Patient reports she has noticed a sharp, stabbing pain in her chest which radiates to her left chest that has been occurring randomly over the past 2 weeks. Patient reports pain is worse with deep inspiration, and is tender to palpation. Patient reports mild shortness of breath with her episodes of chest pain, and several episodes of cramping of her right hand when she experiences the chest pain. Patient reports her abdominal pain began 3 days ago, is a "cramping" sensation and is mild.patient reports mild nausea associated with her cramping sensation, without vomiting. Patient also reports vaginal spotting with blood for the past 3 days. Patient denies any lightheadedness, dizziness, weakness, palpitations, dysuria, vomiting, diarrhea, history of DVT, or extrogenous estrogen use.  Past Medical History  Diagnosis Date  . Headache(784.0)   . Medical history non-contributory   . Infection     UTI   Past Surgical History  Procedure Laterality Date  . No past surgeries    . Cesarean section N/A 12/11/2012    Procedure: CESAREAN SECTION;  Surgeon: Geryl Rankins, MD;  Location: [facility];  Service: Obstetrics;  Laterality: N/A;   Family History  Problem Relation Age of Onset  . Cancer Paternal Aunt     ? brain  . Cancer Paternal Grandmother     breast  . Hypertension Mother    History  Substance Use Topics  . Smoking status: Never Smoker   . Smokeless tobacco: Not on file  . Alcohol Use: No   OB History      Gravida Para Term Preterm AB TAB SAB Ectopic Multiple Living   1 1 1  0 0 0 0 0 0 1     Review of Systems  Constitutional: Negative for fever.  HENT: Negative for trouble swallowing.   Eyes: Negative for visual disturbance.  Respiratory: Positive for shortness of breath.   Cardiovascular: Negative for chest pain.  Gastrointestinal: Positive for nausea. Negative for vomiting.       Abdominal cramping  Genitourinary: Positive for vaginal bleeding. Negative for dysuria, vaginal discharge and vaginal pain.  Musculoskeletal: Negative for neck pain.  Skin: Negative for rash.  Neurological: Negative for dizziness, weakness and numbness.  Psychiatric/Behavioral: Negative.       Allergies  Review of patient's allergies indicates no known allergies.  Home Medications   Prior to Admission medications   Medication Sig Start Date End Date Taking? Authorizing Provider  guaiFENesin (ROBITUSSIN) 100 MG/5ML liquid Take 400 mg by mouth 3 (three) times daily as needed for cough or congestion.   Yes Historical Provider, MD  ibuprofen (ADVIL,MOTRIN) 200 MG tablet Take 200 mg by mouth every 6 (six) hours as needed for moderate pain.   Yes Historical Provider, MD  clotrimazole (LOTRIMIN) 1 % cream Apply 1 applicator vaginally for 7 nights. 12/29/13   Monte Fantasia, PA-C  famotidine (PEPCID) 20 MG tablet Take 1 tablet (20 mg total) by mouth 2 (two) times daily. 09/02/13   Vida Roller, MD   BP 84/47 mmHg  Pulse 82  Temp(Src) 98 F (36.7 C) (Oral)  Resp 24  SpO2 100%  LMP 12/16/2013 Physical Exam  Constitutional: She is oriented to person, place, and time. She appears well-developed and well-nourished. No distress.  HENT:  Head: Normocephalic and atraumatic.  Mouth/Throat: Oropharynx is clear and moist. No oropharyngeal exudate.  Eyes: Right eye exhibits no discharge. Left eye exhibits no discharge. No scleral icterus.  Neck: Normal range of motion.  Cardiovascular: Normal rate, regular  rhythm, S1 normal, S2 normal and normal heart sounds.   No murmur heard. Pulses:      Radial pulses are 2+ on the right side, and 2+ on the left side.  Pulmonary/Chest: Effort normal and breath sounds normal. No accessory muscle usage. No tachypnea. No respiratory distress.    Abdominal: Soft. Normal appearance and bowel sounds are normal. There is tenderness in the suprapubic area.  Mild suprapubic tenderness.  Genitourinary: Pelvic exam was performed with patient supine. There is no rash, tenderness, lesion or injury on the right labia. There is no rash, tenderness, lesion or injury on the left labia. Cervix exhibits no motion tenderness, no discharge and no friability. Right adnexum displays no mass, no tenderness and no fullness. Left adnexum displays no mass, no tenderness and no fullness. No erythema, tenderness or bleeding in the vagina. No foreign body around the vagina. No signs of injury around the vagina. Vaginal discharge found.  Curd-like white colored thick discharge in vaginal vault. No adnexal tenderness, cervical motion tenderness, cervical friability or discharge.chaperone present during entire pelvic examination.  Musculoskeletal: Normal range of motion. She exhibits no edema or tenderness.  Neurological: She is alert and oriented to person, place, and time. No cranial nerve deficit. Coordination normal.  Skin: Skin is warm and dry. No rash noted. She is not diaphoretic.  Psychiatric: She has a normal mood and affect.  Nursing note and vitals reviewed.   ED Course  Procedures (including critical care time) Labs Review Labs Reviewed  WET PREP, GENITAL - Abnormal; Notable for the following:    Yeast Wet Prep HPF POC MANY (*)    Clue Cells Wet Prep HPF POC FEW (*)    WBC, Wet Prep HPF POC TOO NUMEROUS TO COUNT (*)    All other components within normal limits  CBC - Abnormal; Notable for the following:    Hemoglobin 10.9 (*)    HCT 35.4 (*)    MCV 74.4 (*)    MCH 22.9  (*)    All other components within normal limits  URINALYSIS, ROUTINE W REFLEX MICROSCOPIC - Abnormal; Notable for the following:    APPearance TURBID (*)    All other components within normal limits  URINE MICROSCOPIC-ADD ON - Abnormal; Notable for the following:    Squamous Epithelial / LPF FEW (*)    All other components within normal limits  GC/CHLAMYDIA PROBE AMP  BASIC METABOLIC PANEL  PRO B NATRIURETIC PEPTIDE  PREGNANCY, URINE  I-STAT TROPOININ, ED    Imaging Review Dg Chest 2 View  12/28/2013   CLINICAL DATA:  Short chest pain and discomfort for 2 weeks.  EXAM: CHEST  2 VIEW  COMPARISON:  05/04/2013  FINDINGS: Slightly shallow inspiration. The heart size and mediastinal contours are within normal limits. Both lungs are clear. The visualized skeletal structures are unremarkable.  IMPRESSION: No active cardiopulmonary disease.   Electronically Signed   By: Burman NievesWilliam  Stevens M.D.   On: 12/28/2013 22:54     EKG Interpretation   Date/Time:  Thursday December 28 2013 19:06:02 EST Ventricular Rate:  95 PR Interval:  146 QRS Duration: 74 QT Interval:  330 QTC Calculation: 414 R Axis:   23 Text Interpretation:  Normal sinus rhythm Normal ECG since last tracing no  significant change Confirmed by Effie ShyWENTZ  MD, Mechele CollinELLIOTT ([id number]) on 12/28/2013  11:34:29 PM      MDM   Final diagnoses:  Chest discomfort  Vaginal yeast infection    Patient here with multiple complaints tonight. Patient describing a chest pain past 2 weeks, and a mild abdominal cramping for the past 3 days with some mild vaginal spotting. Patient's chest pain is reproducibleand tender in her sternum. HEART score 0, PERC negative.  Will rule out ACS. Patient is well-appearing and in no acute distress, lying comfortably on the bed during exam. Mild suprapubic tenderness noted.negative pregnancy test, no concern for ectopic pregnancy. On pelvic exam no adnexal tenderness is noted, patient's pain remains suprapubic, no  concern for ovarian torsion, PID.  Patient is nontoxic, nonseptic appearing, in no apparent distress.   1. Chest discomfort: Chest pain is not likely of cardiac or pulmonary etiology d/t presentation, perc negative, VSS, no tracheal deviation, no JVD or new murmur, RRR, breath sounds equal bilaterally, EKG without acute abnormalities, negative troponin, and negative CXR. Pt has been advised return to the ED is CP becomes exertional, associated with diaphoresis or nausea, radiates to left jaw/arm, worsens or becomes concerning in any way. Pt appears reliable for follow up and is agreeable to discharge.   2. Abdominal cramping:  Patient's pain and other symptoms adequately managed in emergency department.  Fluid bolus given.  Labs, imaging and vitals reviewed.  Patient does not meet the SIRS or Sepsis criteria.  On repeat exam patient does not have a surgical abdomin and there are no peritoneal signs.  No indication of appendicitis, bowel obstruction, bowel perforation, cholecystitis, diverticulitis, PID or ectopic pregnancy.  Patient discharged home with symptomatic treatment and given strict instructions for follow-up with their primary care physician.  I also strongly encouraged patient to follow-up with OB/GYN regarding her abdominal cramping and vaginal spotting. Patient given clotrimazole cream for a yeast infection. I discussed return precautions with patient, encouraged her to call or return to the ER should her symptoms worsen, change, persist or should she have any questions or concerns.  BP 84/47 mmHg  Pulse 82  Temp(Src) 98 F (36.7 C) (Oral)  Resp 24  SpO2 100%  LMP 12/16/2013  Signed,  Ladona MowJoe Janisse Ghan, PA-C 1:07 AM  Patient discussed with Dr. Mancel BaleElliott Wentz, M.D.    Monte FantasiaJoseph W Katyana Trolinger, PA-C 12/29/13 0107  Monte FantasiaJoseph W Deion Forgue, PA-C 12/29/13 0107  Flint MelterElliott L Wentz, MD 12/29/13 [phone]

## 2013-12-28 NOTE — ED Notes (Signed)
Pt denies any pain at this time, will like to hold on Tylenol ordered.

## 2013-12-29 MED ORDER — FLUCONAZOLE 100 MG PO TABS: 150.0000 mg | ORAL_TABLET | Freq: Once | ORAL | Status: DC

## 2013-12-29 MED ORDER — CLOTRIMAZOLE 1 % EX CREA: TOPICAL_CREAM | CUTANEOUS | Status: DC

## 2013-12-29 NOTE — ED Notes (Addendum)
Jomarie LongsJoseph, PA made aware of pts bp, advised okay for pt to be discharged

## 2013-12-29 NOTE — Discharge Instructions (Signed)
Candida Infection A Candida infection (also called yeast, fungus, and Monilia infection) is an overgrowth of yeast that can occur anywhere on the body. A yeast infection commonly occurs in warm, moist body areas. Usually, the infection remains localized but can spread to become a systemic infection. A yeast infection may be a sign of a more severe disease such as diabetes, leukemia, or AIDS. A yeast infection can occur in both men and women. In women, Candida vaginitis is a vaginal infection. It is one of the most common causes of vaginitis. Men usually do not have symptoms or know they have an infection until other problems develop. Men may find out they have a yeast infection because their sex partner has a yeast infection. Uncircumcised men are more likely to get a yeast infection than circumcised men. This is because the uncircumcised glans is not exposed to air and does not remain as dry as that of a circumcised glans. Older adults may develop yeast infections around dentures. CAUSES  Women  Antibiotics.  Steroid medication taken for a long time.  Being overweight (obese).  Diabetes.  Poor immune condition.  Certain serious medical conditions.  Immune suppressive medications for organ transplant patients.  Chemotherapy.  Pregnancy.  Menstruation.  Stress and fatigue.  Intravenous drug use.  Oral contraceptives.  Wearing tight-fitting clothes in the crotch area.  Catching it from a sex partner who has a yeast infection.  Spermicide.  Intravenous, urinary, or other catheters. Men  Catching it from a sex partner who has a yeast infection.  Having oral or anal sex with a person who has the infection.  Spermicide.  Diabetes.  Antibiotics.  Poor immune system.  Medications that suppress the immune system.  Intravenous drug use.  Intravenous, urinary, or other catheters. SYMPTOMS  Women  Thick, white vaginal discharge.  Vaginal itching.  Redness and  swelling in and around the vagina.  Irritation of the lips of the vagina and perineum.  Blisters on the vaginal lips and perineum.  Painful sexual intercourse.  Low blood sugar (hypoglycemia).  Painful urination.  Bladder infections.  Intestinal problems such as constipation, indigestion, bad breath, bloating, increase in gas, diarrhea, or loose stools. Men  Men may develop intestinal problems such as constipation, indigestion, bad breath, bloating, increase in gas, diarrhea, or loose stools.  Dry, cracked skin on the penis with itching or discomfort.  Jock itch.  Dry, flaky skin.  Athlete's foot.  Hypoglycemia. DIAGNOSIS  Women  A history and an exam are performed.  The discharge may be examined under a microscope.  A culture may be taken of the discharge. Men  A history and an exam are performed.  Any discharge from the penis or areas of cracked skin will be looked at under the microscope and cultured.  Stool samples may be cultured. TREATMENT  Women  Vaginal antifungal suppositories and creams.  Medicated creams to decrease irritation and itching on the outside of the vagina.  Warm compresses to the perineal area to decrease swelling and discomfort.  Oral antifungal medications.  Medicated vaginal suppositories or cream for repeated or recurrent infections.  Wash and dry the irritation areas before applying the cream.  Eating yogurt with Lactobacillus may help with prevention and treatment.  Sometimes painting the vagina with gentian violet solution may help if creams and suppositories do not work. Men  Antifungal creams and oral antifungal medications.  Sometimes treatment must continue for 30 days after the symptoms go away to prevent recurrence. HOME CARE INSTRUCTIONS  Women  Use cotton underwear and avoid tight-fitting clothing.  Avoid colored, scented toilet paper and deodorant tampons or pads.  Do not douche.  Keep your diabetes  under control.  Finish all the prescribed medications.  Keep your skin clean and dry.  Consume milk or yogurt with Lactobacillus-active culture regularly. If you get frequent yeast infections and think that is what the infection is, there are over-the-counter medications that you can get. If the infection does not show healing in 3 days, talk to your caregiver.  Tell your sex partner you have a yeast infection. Your partner may need treatment also, especially if your infection does not clear up or recurs. Men  Keep your skin clean and dry.  Keep your diabetes under control.  Finish all prescribed medications.  Tell your sex partner that you have a yeast infection so he or she can be treated if necessary. SEEK MEDICAL CARE IF:   Your symptoms do not clear up or worsen in one week after treatment.  You have an oral temperature above 102 F (38.9 C).  You have trouble swallowing or eating for a prolonged time.  You develop blisters on and around your vagina.  You develop vaginal bleeding and it is not your menstrual period.  You develop abdominal pain.  You develop intestinal problems as mentioned above.  You get weak or light-headed.  You have painful or increased urination.  You have pain during sexual intercourse. MAKE SURE YOU:   Understand these instructions.  Will watch your condition.  Will get help right away if you are not doing well or get worse. Document Released: 03/19/2004 Document Revised: 06/26/2013 Document Reviewed: 07/01/2009 [facility] Patient Information 2015 [place], [place]. This information is not intended to replace advice given to you by your health care provider. Make sure you discuss any questions you have with your health care provider.   Chest Pain (Nonspecific) It is often hard to give a specific diagnosis for the cause of chest pain. There is always a chance that your pain could be related to something serious, such as a heart attack or a  blood clot in the lungs. You need to follow up with your health care provider for further evaluation. CAUSES   Heartburn.  Pneumonia or bronchitis.  Anxiety or stress.  Inflammation around your heart (pericarditis) or lung (pleuritis or pleurisy).  A blood clot in the lung.  A collapsed lung (pneumothorax). It can develop suddenly on its own (spontaneous pneumothorax) or from trauma to the chest.  Shingles infection (herpes zoster virus). The chest wall is composed of bones, muscles, and cartilage. Any of these can be the source of the pain.  The bones can be bruised by injury.  The muscles or cartilage can be strained by coughing or overwork.  The cartilage can be affected by inflammation and become sore (costochondritis). DIAGNOSIS  Lab tests or other studies may be needed to find the cause of your pain. Your health care provider may have you take a test called an ambulatory electrocardiogram (ECG). An ECG records your heartbeat patterns over a 24-hour period. You may also have other tests, such as:  Transthoracic echocardiogram (TTE). During echocardiography, sound waves are used to evaluate how blood flows through your heart.  Transesophageal echocardiogram (TEE).  Cardiac monitoring. This allows your health care provider to monitor your heart rate and rhythm in real time.  Holter monitor. This is a portable device that records your heartbeat and can help diagnose heart arrhythmias. It allows  your health care provider to track your heart activity for several days, if needed.  Stress tests by exercise or by giving medicine that makes the heart beat faster. TREATMENT   Treatment depends on what may be causing your chest pain. Treatment may include:  Acid blockers for heartburn.  Anti-inflammatory medicine.  Pain medicine for inflammatory conditions.  Antibiotics if an infection is present.  You may be advised to change lifestyle habits. This includes stopping smoking  and avoiding alcohol, caffeine, and chocolate.  You may be advised to keep your head raised (elevated) when sleeping. This reduces the chance of acid going backward from your stomach into your esophagus. Most of the time, nonspecific chest pain will improve within 2-3 days with rest and mild pain medicine.  HOME CARE INSTRUCTIONS   If antibiotics were prescribed, take them as directed. Finish them even if you start to feel better.  For the next few days, avoid physical activities that bring on chest pain. Continue physical activities as directed.  Do not use any tobacco products, including cigarettes, chewing tobacco, or electronic cigarettes.  Avoid drinking alcohol.  Only take medicine as directed by your health care provider.  Follow your health care provider's suggestions for further testing if your chest pain does not go away.  Keep any follow-up appointments you made. If you do not go to an appointment, you could develop lasting (chronic) problems with pain. If there is any problem keeping an appointment, call to reschedule. SEEK MEDICAL CARE IF:   Your chest pain does not go away, even after treatment.  You have a rash with blisters on your chest.  You have a fever. SEEK IMMEDIATE MEDICAL CARE IF:   You have increased chest pain or pain that spreads to your arm, neck, jaw, back, or abdomen.  You have shortness of breath.  You have an increasing cough, or you cough up blood.  You have severe back or abdominal pain.  You feel nauseous or vomit.  You have severe weakness.  You faint.  You have chills. This is an emergency. Do not wait to see if the pain will go away. Get medical help at once. Call your local emergency services (911 in U.S.). Do not drive yourself to the hospital. MAKE SURE YOU:   Understand these instructions.  Will watch your condition.  Will get help right away if you are not doing well or get worse. Document Released: 11/19/2004 Document  Revised: 02/14/2013 Document Reviewed: 09/15/2007 [facility] Patient Information 2015 [place], [place]. This information is not intended to replace advice given to you by your health care provider. Make sure you discuss any questions you have with your health care provider.   Emergency Department Resource Guide 1) Find a Doctor and Pay Out of Pocket Although you won't have to find out who is covered by your insurance plan, it is a good idea to ask around and get recommendations. You will then need to call the office and see if the doctor you have chosen will accept you as a new patient and what types of options they offer for patients who are self-pay. Some doctors offer discounts or will set up payment plans for their patients who do not have insurance, but you will need to ask so you aren't surprised when you get to your appointment.  2) Contact Your Local Health Department Not all health departments have doctors that can see patients for sick visits, but many do, so it is worth a call to see  if yours does. If you don't know where your local health department is, you can check in your phone book. The CDC also has a tool to help you locate your state's health department, and many state websites also have listings of all of their local health departments.  3) Find a Walk-in Clinic If your illness is not likely to be very severe or complicated, you may want to try a walk in clinic. These are popping up all over the country in pharmacies, drugstores, and shopping centers. They're usually staffed by nurse practitioners or physician assistants that have been trained to treat common illnesses and complaints. They're usually fairly quick and inexpensive. However, if you have serious medical issues or chronic medical problems, these are probably not your best option.  No Primary Care Doctor: - Call Health Connect at  [phone] - they can help you locate a primary care doctor that  accepts your insurance,  provides certain services, etc. - Physician Referral Service- [phone]  Chronic Pain Problems: Organization         Address  Phone   Notes  [facility]  [phone] Patients need to be referred by their primary care doctor.   Medication Assistance: Organization         Address  Phone   Notes  [facility] Medication [facility] [address] [place], [place] [address] [phone] --Must be a resident of [facility] -- Must have NO insurance coverage whatsoever (no Medicaid/ Medicare, etc.) -- The pt. MUST have a primary care doctor that directs their care regularly and follows them in the community   [facility]  [phone]   [employer]  [phone]    Agencies that provide inexpensive medical care: Organization         Address  Phone   Notes  [facility]  [phone]   [facility]    [phone]   [facility] [address] [place], [place] [address] [phone]   [facility] [address] [place], [place] [phone]   Planned Parenthood    [phone]   [facility]    [phone]   [facility]  [address], [place] Phone:  [phone], Fax:  [phone] Hours of Operation:  9 am - 6 pm, M-F.  Also accepts Medicaid/Medicare and self-pay.  [facility]  [address], [place] Phone: [phone], Fax: [phone]. Hours of Operation:  8:30 am - 5:30 pm, M-F.  Also accepts Medicaid and self-pay.  [facility] [place] [address], [place] Phone: [phone]   [facility] [address] Natasha Bence [place], [place] [phone], Ext. 123 Mondays & Thursdays: 7-9 AM.  First 15 patients are seen on a first come, first serve basis.    Medicaid-accepting [facility] Providers:  Organization         Address  Phone    Notes  [facility] [address], [place] [phone] Also accepts self-pay patients.  [facility] [address] Laurell Josephs [place], [place]  [phone]   [facility] [address], [place] [phone]   [facility] [address] [place] [phone]   Renaye Rakers [address], [place]   (  336) [phone] Only accepts [place] Access Medicaid patients after they have their name applied to their card.   Self-Pay (no insurance) in [facility] [place]:  Organization         Address  Phone   Notes  Sickle Cell Patients, [facility] [address], [place] [phone]) [phone]   [facility] [address], [place] [phone]) [phone]   [facility] [place]  [address], [place] [phone]) [phone]   [facility]/Dr. Osei-Bonsu  [address] [place] [address], [place] or [phone], [place] [phone]) [phone] Phone number for both [place] and [place] locations is the same.  [facility] [address], [place] [phone]) [phone]   [facility]  [address] [place] [address], [place] or [address] [phone]) [phone]) [phone]   [facility] [address], [place] [phone](336) [phone], phone; [phone](336) [phone], fax Sees patients 1st and 3rd Saturday of every month.  Must not qualify for public or private insurance (i.e. Medicaid, Medicare, [place] Health Choice, Veterans' Benefits)  Household income should be no more than 200% of the poverty level The clinic cannot treat you if you are pregnant or think you are pregnant  Sexually transmitted diseases are not treated at the clinic.    Dental Care: Organization         Address  Phone  Notes  [facility] [address], [place] [phone])  [phone] Accepts children up to age 29 who are enrolled in [place] or [place] Health Choice; pregnant women with a Medicaid card; and children who have applied for Medicaid or [place] Health Choice, but were declined, whose parents can pay a reduced fee at time of service.  [facility] [place]  [address], [place] [phone]) [phone] Accepts children up to age 29 who are enrolled in [place] or [place] Health Choice; pregnant women with a Medicaid card; and children who have applied for Medicaid or [place] Health Choice, but were declined, whose parents can pay a reduced fee at time of service.  [facility]  [address], [place] [phone](336) [phone] Patients are seen by appointment only. Walk-ins are not accepted. [facility] will see patients 29 years of age and older. Monday - Tuesday (8am-5pm) Most Wednesdays (8:30-5pm) $30 per visit, cash only  [facility]  [address], [place]  [facility] [phone]) [phone] Patients are seen by appointment only. Walk-ins are not accepted. [facility] will see patients 29 years of age and older. One Wednesday Evening (Monthly: Volunteer Based).  $30 per visit, cash only  [facility]  [phone]) [phone] for adults; Children under age 724, call Graduate Pediatric Dentistry at [phone]. Children aged 554-14, please call [phone]) [phone] to request a pediatric application.  Dental services are provided in all areas of dental care including fillings, crowns and bridges, complete and partial dentures, implants, gum treatment, root canals, and extractions. Preventive care is also provided. Treatment is provided to both adults and children. Patients are selected via a lottery and there is often a waiting list.   [facility] [address], [place]  [phone]) [phone] [email or web address]   [facility] [address], [place], [place] [phone], Ext.  123 Second and Fourth Thursday of each month, opens at 6:30  AM; Clinic ends at 9 AM.  Patients are seen on a first-come first-served basis, and a limited number are seen during each clinic.   [facility]  [address] Ether Griffins [place], [place] [phone]   Eligibility Requirements You must have lived in [place], [place], or [place] counties for at least the last three months.   You cannot be eligible for state or federal sponsored [place], including [employer], [place], or [employer].   You generally cannot be eligible for healthcare insurance through your employer.    How to apply: Eligibility screenings are held every Tuesday and Wednesday afternoon from 1:00 pm until 4:00 pm. You do not need an appointment for the interview!  [facility] [address] [place], [place] [phone]   [facility] Health Department  [phone]   [facility] Health Department  [phone]   [facility] Health Department  [phone]    Behavioral Health Resources in the Community: Intensive Outpatient Programs Organization         Address  Phone  Notes  [facility] [address], [place], [place] [phone]   [facility] Outpatient [address], [place], [place] [phone]   [facility] [address], [place], [place]  [phone]   [facility] [address]  [place], [place] [phone] or [phone]   Substance Abuse Resources Organization         Address  Phone  Notes  [facility] Drug [facility]  [phone]   [facility]  [phone]   [facility] [place]  [phone]   Floydene Flock  [phone]   Residential & Outpatient Substance Abuse Program  [phone]   Psychological Services Organization         Address  Phone  Notes  [facility]  [phone]   [facility]  [phone]   [facility]  Mental Health [address], [place] [phone] or [phone]    Mobile Crisis Teams Organization         Address  Phone  Notes  [facility], Mobile Crisis Care Unit  [phone]   [facility]  [address] [place], [place] [phone]   Doristine Locks [address] [place] [phone]    Self-Help/Support Groups Organization         Address  Phone             Notes  [facility] [place] - variety of support groups  [phone]- I7437963 Call for more information  Narcotics Anonymous (NA), [facility] [address], [employer] [place]  2 meetings at this location   Statistician         Address  Phone  Notes  [facility] [address],    [place]  [phone]   [facility]  [address], [place] [address]341, [place], [place] [phone]   [facility] [address] [place], [place] [phone] Admissions: 8am-3pm M-F  [facility] [address]    [place], [place] [phone]   [facility] [address] Starling Manns [place], [place] [phone]   [facility] [address]  [place], [place] [phone]   [facility] - Intensive Outpatient [address]urell Joseph[address], [place], [place] [phone]   [facility] ([facility] [address] [place], [place] [phone] or [phone]  [facility] ([facility]) [address] [place], [place] [phone] Accepts Medicaid  [facility] [address]  [place] [phone] Substance Abuse/Addiction Treatment   [facility] Behavioral Health Resources Organization         Address  Phone  Notes  [facility]  [phone]) [phone]   Angie FavaJulie Brannon, PhD [address], [address] [place], [place]   [phone]) [phone] or [phone]) [phone]   [facility]    [address] [place], [place] [phone]) [phone]   [facility] [address], [place], [place] [phone]) [phone] Insurance/Medicaid/sponsorship through [facility]  [facility] [address]                                    [place], [place] [phone]) [phone] Therapy/tele-psych/case  [facility] [address].   [place], [place] [phone]) [phone]    Dr. Lolly MustacheArfeen  [phone]) [phone]   [facility]  [employer] [facility] 1) [address], [place] 2) [address] [place] [address], [place] [address])  [address], [place] [phone](336) [phone]) [phone]  [phone]) [phone]   [facility] Hotline [phone](336) [phone] or [phone]) [phone] (After Hours)

## 2013-12-30 LAB — GC/CHLAMYDIA PROBE AMP
CT Probe RNA: NEGATIVE
GC PROBE AMP APTIMA: NEGATIVE

## 2014-09-26 ENCOUNTER — Emergency Department (HOSPITAL_COMMUNITY)
Admission: EM | Admit: 2014-09-26 | Discharge: 2014-09-26 | Disposition: A | Discharge status: Home / Self Care | Payer: Medicaid Other | Attending: Emergency Medicine | Admitting: Emergency Medicine

## 2014-09-26 ENCOUNTER — Encounter (HOSPITAL_COMMUNITY): Payer: Self-pay | Admitting: Emergency Medicine

## 2014-09-26 DIAGNOSIS — N12 Tubulo-interstitial nephritis, not specified as acute or chronic: Secondary | ICD-10-CM | POA: Insufficient documentation

## 2014-09-26 DIAGNOSIS — Z3202 Encounter for pregnancy test, result negative: Secondary | ICD-10-CM | POA: Insufficient documentation

## 2014-09-26 DIAGNOSIS — Z87440 Personal history of urinary (tract) infections: Secondary | ICD-10-CM | POA: Diagnosis not present

## 2014-09-26 DIAGNOSIS — R109 Unspecified abdominal pain: Secondary | ICD-10-CM | POA: Diagnosis present

## 2014-09-26 LAB — URINE MICROSCOPIC-ADD ON

## 2014-09-26 LAB — URINALYSIS, ROUTINE W REFLEX MICROSCOPIC
Bilirubin Urine: NEGATIVE
Glucose, UA: NEGATIVE mg/dL
Hgb urine dipstick: NEGATIVE
Ketones, ur: NEGATIVE mg/dL
NITRITE: POSITIVE — AB
Protein, ur: NEGATIVE mg/dL
SPECIFIC GRAVITY, URINE: 1.027 (ref 1.005–1.030)
Urobilinogen, UA: 0.2 mg/dL (ref 0.0–1.0)
pH: 6 (ref 5.0–8.0)

## 2014-09-26 LAB — BASIC METABOLIC PANEL
Anion gap: 6 (ref 5–15)
BUN: 10 mg/dL (ref 6–20)
CHLORIDE: 108 mmol/L (ref 101–111)
CO2: 24 mmol/L (ref 22–32)
Calcium: 9.6 mg/dL (ref 8.9–10.3)
Creatinine, Ser: 0.72 mg/dL (ref 0.44–1.00)
GFR calc Af Amer: >60 mL/min (ref >60)
Glucose, Bld: 86 mg/dL (ref 65–99)
POTASSIUM: 4 mmol/L (ref 3.5–5.1)
Sodium: 138 mmol/L (ref 135–145)

## 2014-09-26 LAB — WET PREP, GENITAL
TRICH WET PREP: NONE SEEN
YEAST WET PREP: NONE SEEN

## 2014-09-26 LAB — I-STAT BETA HCG BLOOD, ED (MC, WL, AP ONLY): I-stat hCG, quantitative: <5 m[IU]/mL (ref <5)

## 2014-09-26 LAB — CBC
HCT: 36.2 % (ref 36.0–46.0)
Hemoglobin: 11.1 g/dL — ABNORMAL LOW (ref 12.0–15.0)
MCH: 22.8 pg — AB (ref 26.0–34.0)
MCHC: 30.7 g/dL (ref 30.0–36.0)
MCV: 74.3 fL — ABNORMAL LOW (ref 78.0–100.0)
Platelets: 369 10*3/uL (ref 150–400)
RBC: 4.87 MIL/uL (ref 3.87–5.11)
RDW: 14.4 % (ref 11.5–15.5)
WBC: 6.5 10*3/uL (ref 4.0–10.5)

## 2014-09-26 MED ORDER — DEXTROSE 5 % IV SOLN: 1.0000 g | Freq: Once | INTRAVENOUS | Status: DC

## 2014-09-26 MED ORDER — STERILE WATER FOR INJECTION IJ SOLN
INTRAMUSCULAR | Status: AC
  Filled 2014-09-26: qty 10

## 2014-09-26 MED ORDER — CEFTRIAXONE SODIUM 1 G IJ SOLR
1.0000 g | Freq: Once | INTRAMUSCULAR | Status: AC
  Administered 2014-09-26: 1 g via INTRAMUSCULAR
  Filled 2014-09-26: qty 10

## 2014-09-26 MED ORDER — CEPHALEXIN 500 MG PO CAPS: 500.0000 mg | ORAL_CAPSULE | Freq: Four times a day (QID) | ORAL | Status: DC

## 2014-09-26 NOTE — Progress Notes (Signed)
pcp is [facility] [address] [place], [place] [address] [phone]

## 2014-09-26 NOTE — Discharge Instructions (Signed)
Pyelonephritis, Adult °Pyelonephritis is a kidney infection. In general, there are 2 main types of pyelonephritis: °· Infections that come on quickly without any warning (acute pyelonephritis). °· Infections that persist for a long period of time (chronic pyelonephritis). °CAUSES  °Two main causes of pyelonephritis are: °· Bacteria traveling from the bladder to the kidney. This is a problem especially in pregnant women. The urine in the bladder can become filled with bacteria from multiple causes, including: °¨ Inflammation of the prostate gland (prostatitis). °¨ Sexual intercourse in females. °¨ Bladder infection (cystitis). °· Bacteria traveling from the bloodstream to the tissue part of the kidney. °Problems that may increase your risk of getting a kidney infection include: °· Diabetes. °· Kidney stones or bladder stones. °· Cancer. °· Catheters placed in the bladder. °· Other abnormalities of the kidney or ureter. °SYMPTOMS  °· Abdominal pain. °· Pain in the side or flank area. °· Fever. °· Chills. °· Upset stomach. °· Blood in the urine (dark urine). °· Frequent urination. °· Strong or persistent urge to urinate. °· Burning or stinging when urinating. °DIAGNOSIS  °Your caregiver may diagnose your kidney infection based on your symptoms. A urine sample may also be taken. °TREATMENT  °In general, treatment depends on how severe the infection is.  °· If the infection is mild and caught early, your caregiver may treat you with oral antibiotics and send you home. °· If the infection is more severe, the bacteria may have gotten into the bloodstream. This will require intravenous (IV) antibiotics and a hospital stay. Symptoms may include: °¨ High fever. °¨ Severe flank pain. °¨ Shaking chills. °· Even after a hospital stay, your caregiver may require you to be on oral antibiotics for a period of time. °· Other treatments may be required depending upon the cause of the infection. °HOME CARE INSTRUCTIONS  °· Take your  antibiotics as directed. Finish them even if you start to feel better. °· Make an appointment to have your urine checked to make sure the infection is gone. °· Drink enough fluids to keep your urine clear or pale yellow. °· Take medicines for the bladder if you have urgency and frequency of urination as directed by your caregiver. °SEEK IMMEDIATE MEDICAL CARE IF:  °· You have a fever or persistent symptoms for more than 2-3 days. °· You have a fever and your symptoms suddenly get worse. °· You are unable to take your antibiotics or fluids. °· You develop shaking chills. °· You experience extreme weakness or fainting. °· There is no improvement after 2 days of treatment. °MAKE SURE YOU: °· Understand these instructions. °· Will watch your condition. °· Will get help right away if you are not doing well or get worse. °Document Released: 02/09/2005 Document Revised: 08/11/2011 Document Reviewed: 07/16/2010 °ExitCare® Patient Information ©2015 [employer]. This information is not intended to replace advice given to you by your health care provider. Make sure you discuss any questions you have with your health care provider. ° °

## 2014-09-26 NOTE — ED Notes (Signed)
PA at the bedside.

## 2014-09-26 NOTE — ED Notes (Signed)
Per patient, states right flank pain with dysuria for 4 days

## 2014-09-26 NOTE — ED Notes (Signed)
Pt given juice, crackers, and peanut butter.

## 2014-09-26 NOTE — ED Provider Notes (Signed)
CSN: [id number]     Arrival date & time 09/26/14  1810 History   First MD Initiated Contact with Patient 09/26/14 1922     Chief Complaint  Patient presents with  . Flank Pain   Jackie Brooks is a 29 y.o. female who presents to the ED complaining of 4 days of right flank pain with associated dysuria, urinary odor, and suprapubic abdominal pain. Patient approaches sex active and does not always use protection. She currently complains of 9 out of 10 right flank and suprapubic pain. She has takern nothing for treatment today. The patient denies fevers, chills, vaginal discharge, vaginal bleeding, urinary frequency, urinary urgency, hematuria, nausea, vomiting, diarrhea, constipation or rashes.  (Consider location/radiation/quality/duration/timing/severity/associated sxs/prior Treatment) HPI  Past Medical History  Diagnosis Date  . Headache(784.0)   . Medical history non-contributory   . Infection     UTI   Past Surgical History  Procedure Laterality Date  . No past surgeries    . Cesarean section N/A 12/11/2012    Procedure: CESAREAN SECTION;  Surgeon: Geryl Rankins, MD;  Location: [facility];  Service: Obstetrics;  Laterality: N/A;   Family History  Problem Relation Age of Onset  . Cancer Paternal Aunt     ? brain  . Cancer Paternal Grandmother     breast  . Hypertension Mother    History  Substance Use Topics  . Smoking status: Never Smoker   . Smokeless tobacco: Not on file  . Alcohol Use: No   OB History    Gravida Para Term Preterm AB TAB SAB Ectopic Multiple Living   1 1 1  0 0 0 0 0 0 1     Review of Systems  Constitutional: Negative for fever and chills.  HENT: Negative for congestion, mouth sores and sore throat.   Eyes: Negative for visual disturbance.  Respiratory: Negative for cough and shortness of breath.   Cardiovascular: Negative for chest pain.  Gastrointestinal: Positive for abdominal pain. Negative for nausea, vomiting, diarrhea and blood in stool.   Genitourinary: Positive for dysuria and flank pain. Negative for urgency, frequency, hematuria, decreased urine volume, vaginal bleeding, vaginal discharge, difficulty urinating, genital sores, menstrual problem and pelvic pain.  Musculoskeletal: Negative for back pain and neck pain.  Skin: Negative for rash.  Neurological: Negative for light-headedness and headaches.      Allergies  Review of patient's allergies indicates no known allergies.  Home Medications   Prior to Admission medications   Medication Sig Start Date End Date Taking? Authorizing Provider  ibuprofen (ADVIL,MOTRIN) 200 MG tablet Take 400 mg by mouth every 6 (six) hours as needed for moderate pain.    Yes Historical Provider, MD  cephALEXin (KEFLEX) 500 MG capsule Take 1 capsule (500 mg total) by mouth 4 (four) times daily. 09/26/14   Everlene Farrier, PA-C  clotrimazole (LOTRIMIN) 1 % cream Apply 1 applicator vaginally for 7 nights. Patient not taking: Reported on 09/26/2014 12/29/13   Ladona Mow, PA-C  famotidine (PEPCID) 20 MG tablet Take 1 tablet (20 mg total) by mouth 2 (two) times daily. Patient not taking: Reported on 09/26/2014 09/02/13   Eber Hong, MD   BP 106/66 mmHg  Pulse 70  Temp(Src) 98.5 F (36.9 C) (Oral)  Resp 15  SpO2 100%  LMP 08/30/2014 Physical Exam  Constitutional: She appears well-developed and well-nourished. No distress.  Nontoxic appearing.  HENT:  Head: Normocephalic and atraumatic.  Mouth/Throat: Oropharynx is clear and moist. No oropharyngeal exudate.  Eyes: Conjunctivae are normal. Pupils  are equal, round, and reactive to light. Right eye exhibits no discharge. Left eye exhibits no discharge.  Neck: Neck supple.  Cardiovascular: Normal rate, regular rhythm, normal heart sounds and intact distal pulses.  Exam reveals no gallop and no friction rub.   No murmur heard. Pulmonary/Chest: Effort normal and breath sounds normal. No respiratory distress. She has no wheezes. She has no rales.   Abdominal: Soft. Bowel sounds are normal. She exhibits no distension and no mass. There is tenderness. There is no rebound and no guarding.  Abdomen is soft. Bowel sounds are present. Patient has moderate suprapubic tenderness to palpation. No McBurney's point tenderness. Negative psoas and obturator sign.  Genitourinary:  Pelvic exam performed by me with female nurse chaperone. Patient has a moderate amount of white vaginal discharge. No external lesions or rashes noted. Cervix is closed. No vaginal bleeding. No cervical motion tenderness. No adnexal tenderness or fullness.  Musculoskeletal: She exhibits no edema.  Lymphadenopathy:    She has no cervical adenopathy.  Neurological: She is alert. Coordination normal.  Skin: Skin is warm and dry. No rash noted. She is not diaphoretic. No erythema. No pallor.  Psychiatric: She has a normal mood and affect. Her behavior is normal.  Nursing note and vitals reviewed.   ED Course  Procedures (including critical care time) Labs Review Labs Reviewed  WET PREP, GENITAL - Abnormal; Notable for the following:    Clue Cells Wet Prep HPF POC FEW (*)    WBC, Wet Prep HPF POC FEW (*)    All other components within normal limits  URINALYSIS, ROUTINE W REFLEX MICROSCOPIC (NOT AT [facility]) - Abnormal; Notable for the following:    APPearance CLOUDY (*)    Nitrite POSITIVE (*)    Leukocytes, UA MODERATE (*)    All other components within normal limits  CBC - Abnormal; Notable for the following:    Hemoglobin 11.1 (*)    MCV 74.3 (*)    MCH 22.8 (*)    All other components within normal limits  URINE MICROSCOPIC-ADD ON - Abnormal; Notable for the following:    Squamous Epithelial / LPF FEW (*)    Bacteria, UA MANY (*)    All other components within normal limits  BASIC METABOLIC PANEL  HIV ANTIBODY (ROUTINE TESTING)  RPR  I-STAT BETA HCG BLOOD, ED ([facility], [facility], [facility] ONLY)  GC/CHLAMYDIA PROBE AMP ([place]) NOT AT [facility]    Imaging Review No results  found.   EKG Interpretation None      Filed Vitals:   09/26/14 1835 09/26/14 1949 09/26/14 2118  BP: 113/57  106/66  Pulse: 88  70  Temp:  98.2 F (36.8 C) 98.5 F (36.9 C)  TempSrc: Oral Oral Oral  Resp: 18  15  SpO2: 100%  100%     MDM   Meds given in ED:  Medications  sterile water (preservative free) injection (not administered)  cefTRIAXone (ROCEPHIN) injection 1 g (1 g Intramuscular Given 09/26/14 2015)    New Prescriptions   CEPHALEXIN (KEFLEX) 500 MG CAPSULE    Take 1 capsule (500 mg total) by mouth 4 (four) times daily.    Final diagnoses:  Pyelonephritis   This is a 29 y.o. female who presents to the ED complaining of 4 days of right flank pain with associated dysuria, urinary odor, and suprapubic abdominal pain. Patient approaches sex active and does not always use protection. She currently complains of 9 out of 10 right flank and suprapubic pain. On  exam the patient is afebrile and nontoxic appearing. She denies vaginal discharge or bleeding. Patient has mild superpubic tenderness to palpation. No peritoneal signs. She has right flank tenderness. On pelvic exam the patient has a mild amount of white vaginal discharge no cervical motion tenderness. No adnexal tenderness or fullness. Urinalysis indicates nitrite-positive urine with moderate leukocytes. There are 21-50 white blood cells. Wet prep indicates few clue cells and few white blood cells. BMP is within normal limits. CBC is unremarkable. She is a negative i-STAT hCG. Patient's exam and history are consistent with pyelonephritis. Patient given Rocephin in the emergency department and started on Keflex at home. I advised the patient that she has pending gonorrhea, chlamydia, HIV and RPR testing. I educated about safe sex. I encouraged her to follow-up with her primary care provider and with her STD testing. I advised the patient to follow-up with their primary care provider this week. I advised the patient to return to  the emergency department with new or worsening symptoms or new concerns. The patient verbalized understanding and agreement with plan.    This patient was discussed with Dr. Fayrene Fearing who agrees with assessment and plan.     Everlene Farrier, PA-C 09/26/14 2210  Rolland Porter, MD 10/02/14 [phone]

## 2014-09-27 LAB — RPR: RPR Ser Ql: NONREACTIVE

## 2014-09-27 LAB — GC/CHLAMYDIA PROBE AMP (~~LOC~~) NOT AT ARMC
CHLAMYDIA, DNA PROBE: NEGATIVE
Neisseria Gonorrhea: NEGATIVE

## 2014-09-27 LAB — HIV ANTIBODY (ROUTINE TESTING W REFLEX): HIV SCREEN 4TH GENERATION: NONREACTIVE

## 2014-11-15 ENCOUNTER — Encounter (HOSPITAL_COMMUNITY): Payer: Self-pay

## 2014-11-15 ENCOUNTER — Emergency Department (HOSPITAL_COMMUNITY)
Admission: EM | Admit: 2014-11-15 | Discharge: 2014-11-15 | Disposition: A | Discharge status: Home / Self Care | Payer: Medicaid Other | Attending: Emergency Medicine | Admitting: Emergency Medicine

## 2014-11-15 DIAGNOSIS — Z3202 Encounter for pregnancy test, result negative: Secondary | ICD-10-CM | POA: Insufficient documentation

## 2014-11-15 DIAGNOSIS — R109 Unspecified abdominal pain: Secondary | ICD-10-CM | POA: Diagnosis present

## 2014-11-15 DIAGNOSIS — Z87440 Personal history of urinary (tract) infections: Secondary | ICD-10-CM | POA: Diagnosis not present

## 2014-11-15 DIAGNOSIS — R1084 Generalized abdominal pain: Secondary | ICD-10-CM | POA: Diagnosis not present

## 2014-11-15 DIAGNOSIS — Z79899 Other long term (current) drug therapy: Secondary | ICD-10-CM | POA: Diagnosis not present

## 2014-11-15 DIAGNOSIS — N939 Abnormal uterine and vaginal bleeding, unspecified: Secondary | ICD-10-CM | POA: Insufficient documentation

## 2014-11-15 LAB — URINALYSIS, ROUTINE W REFLEX MICROSCOPIC
BILIRUBIN URINE: NEGATIVE
Glucose, UA: NEGATIVE mg/dL
Ketones, ur: NEGATIVE mg/dL
Leukocytes, UA: NEGATIVE
NITRITE: NEGATIVE
PH: 6.5 (ref 5.0–8.0)
Protein, ur: NEGATIVE mg/dL
SPECIFIC GRAVITY, URINE: 1.028 (ref 1.005–1.030)
UROBILINOGEN UA: 0.2 mg/dL (ref 0.0–1.0)

## 2014-11-15 LAB — URINE MICROSCOPIC-ADD ON

## 2014-11-15 LAB — POC URINE PREG, ED: Preg Test, Ur: NEGATIVE

## 2014-11-15 MED ORDER — NORGESTIMATE-ETH ESTRADIOL 0.25-35 MG-MCG PO TABS: 1.0000 | ORAL_TABLET | Freq: Every day | ORAL | Status: DC

## 2014-11-15 NOTE — ED Notes (Signed)
Pt also complains of being really tired all the time

## 2014-11-15 NOTE — ED Notes (Signed)
Pt states that she already had her menstrual cycle at the beginning of the month and then she took the Plan B pill on the 14th and then today she started cramping and then she started to bleed again

## 2014-11-15 NOTE — ED Provider Notes (Signed)
CSN: [id number]     Arrival date & time 11/15/14  2025 History   First MD Initiated Contact with Patient 11/15/14 2140     Chief Complaint  Patient presents with  . Abdominal Pain     (Consider location/radiation/quality/duration/timing/severity/associated sxs/prior Treatment) Patient is a 29 y.o. female presenting with vaginal bleeding.  Vaginal Bleeding Quality:  Bright red Severity:  Mild Onset quality:  Gradual Timing:  Constant Progression:  Worsening Chronicity:  New Menstrual history:  Irregular Possible pregnancy: no   Associated symptoms: no abdominal pain, no dysuria, no fever, no nausea and no vaginal discharge     Past Medical History  Diagnosis Date  . Headache(784.0)   . Medical history non-contributory   . Infection     UTI   Past Surgical History  Procedure Laterality Date  . No past surgeries    . Cesarean section N/A 12/11/2012    Procedure: CESAREAN SECTION;  Surgeon: Geryl Rankins, MD;  Location: [facility];  Service: Obstetrics;  Laterality: N/A;   Family History  Problem Relation Age of Onset  . Cancer Paternal Aunt     ? brain  . Cancer Paternal Grandmother     breast  . Hypertension Mother    Social History  Substance Use Topics  . Smoking status: Never Smoker   . Smokeless tobacco: None  . Alcohol Use: No   OB History    Gravida Para Term Preterm AB TAB SAB Ectopic Multiple Living   0 0 0 0 0 0 1     Review of Systems  Constitutional: Negative for fever and chills.  Eyes: Negative for pain.  Respiratory: Negative for cough and shortness of breath.   Gastrointestinal: Negative for nausea, vomiting and abdominal pain.  Genitourinary: Positive for vaginal bleeding. Negative for dysuria, hematuria and vaginal discharge.      Allergies  Review of patient's allergies indicates no known allergies.  Home Medications   Prior to Admission medications   Medication Sig Start Date End Date Taking? Authorizing Provider  ibuprofen  (ADVIL,MOTRIN) 200 MG tablet Take 400 mg by mouth every 6 (six) hours as needed for moderate pain.    Yes Historical Provider, MD  levonorgestrel (PLAN B,NEXT CHOICE) 0.75 MG tablet Take 0.75 mg by mouth once.   Yes Historical Provider, MD  PRESCRIPTION MEDICATION Take 1 capsule by mouth 2 (two) times daily.   Yes Historical Provider, MD  cephALEXin (KEFLEX) 500 MG capsule Take 1 capsule (500 mg total) by mouth 4 (four) times daily. Patient not taking: Reported on 11/15/2014 09/26/14   Everlene Farrier, PA-C  clotrimazole (LOTRIMIN) 1 % cream Apply 1 applicator vaginally for 7 nights. Patient not taking: Reported on 09/26/2014 12/29/13   Ladona Mow, PA-C  famotidine (PEPCID) 20 MG tablet Take 1 tablet (20 mg total) by mouth 2 (two) times daily. Patient not taking: Reported on 09/26/2014 09/02/13   Eber Hong, MD   BP 114/66 mmHg  Pulse 86  Temp(Src) 98.3 F (36.8 C) (Oral)  Resp 18  SpO2 100%  LMP 11/15/2014 Physical Exam  Constitutional: She is oriented to person, place, and time. She appears well-developed and well-nourished.  HENT:  Head: Normocephalic and atraumatic.  Eyes: Conjunctivae and EOM are normal. Right eye exhibits no discharge. Left eye exhibits no discharge.  Cardiovascular: Normal rate and regular rhythm.   Pulmonary/Chest: Effort normal and breath sounds normal. No respiratory distress.  Abdominal: Soft. She exhibits no distension. There is no tenderness. There is no rebound.  Musculoskeletal: Normal range of motion. She exhibits no edema or tenderness.  Neurological: She is alert and oriented to person, place, and time.  Skin: Skin is warm and dry.  Nursing note and vitals reviewed.   ED Course  Procedures (including critical care time) Labs Review Labs Reviewed - No data to display  Imaging Review No results found. I have personally reviewed and evaluated these images and lab results as part of my medical decision-making.   EKG Interpretation None      MDM    Final diagnoses:  Generalized abdominal pain   29 yo F w/ change in uterine bleeding after plan b a week ago. Abdomen benign. Not pregnant or UTI.  Abnormal uterine bleeding secondary hormone imbalances, will start on OCP's and fu w/ PCP.  I have personally and contemperaneously reviewed labs and imaging and used in my decision making as above.   A medical screening exam was performed and I feel the patient has had an appropriate workup for their chief complaint at this time and likelihood of emergent condition existing is low. They have been counseled on decision, discharge, follow up and which symptoms necessitate immediate return to the emergency department. They or their family verbally stated understanding and agreement with plan and discharged in stable condition.      Marily Memos, MD 11/16/14 [phone]
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
## 2015-07-07 IMAGING — US US ABDOMEN LIMITED
1 series · 14 of 25 positions shown · non-contrast
Comparison: 05/18/2012

CLINICAL DATA: Epigastric and RIGHT upper quadrant pain, nausea,
question cholecystitis

EXAM:
US ABDOMEN LIMITED - RIGHT UPPER QUADRANT

[Series 1: us abdomen limited · 0.20mm/px · 14 of 43 slices shown]
[im 1/43]
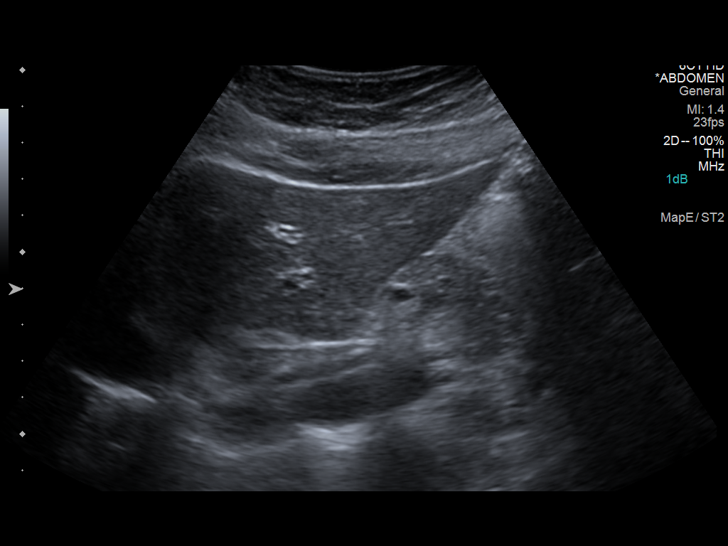
[im 4/43]
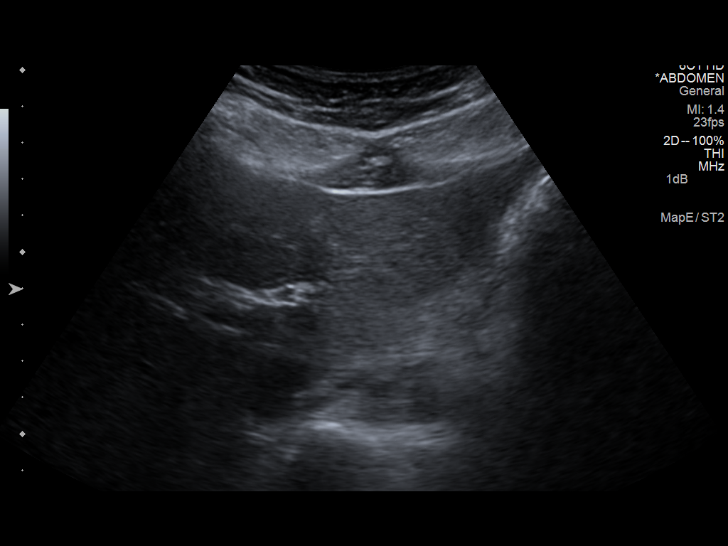
[im 8/43]
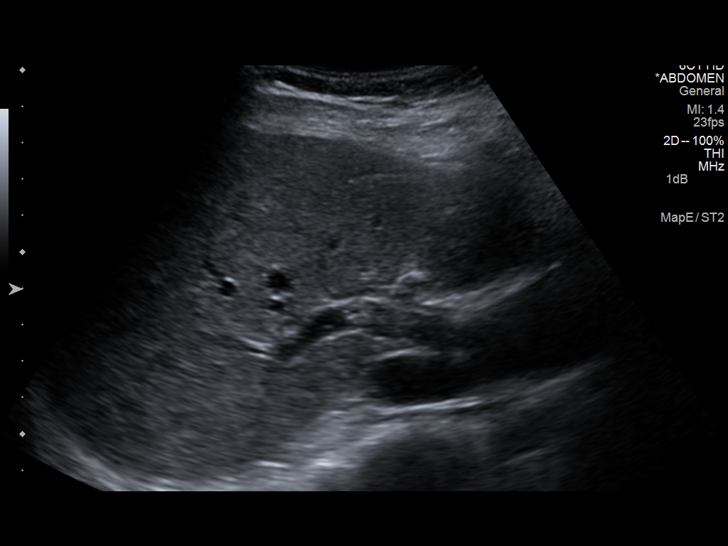
[im 11/43]
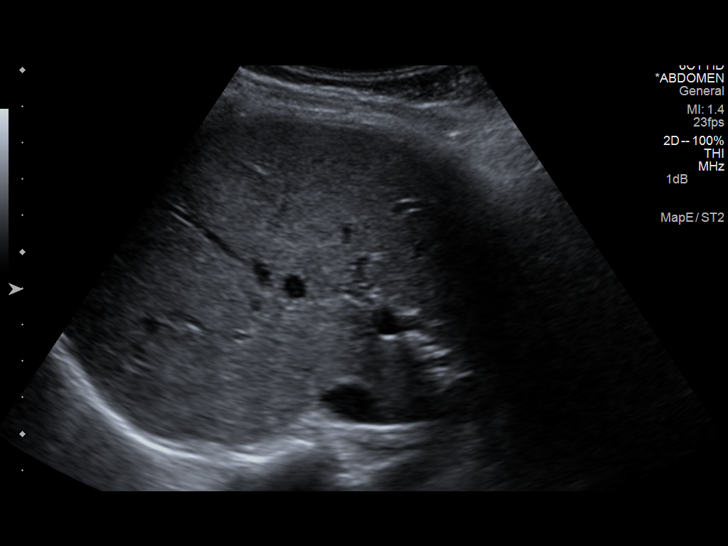
[im 15/43]
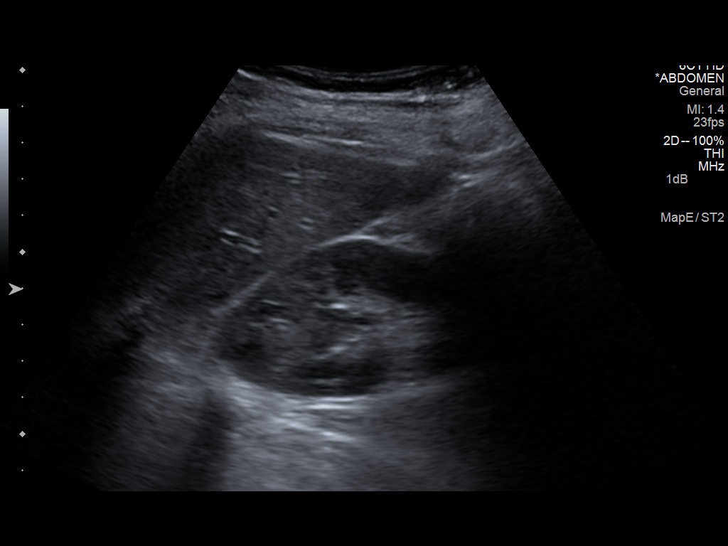
[im 16/43]
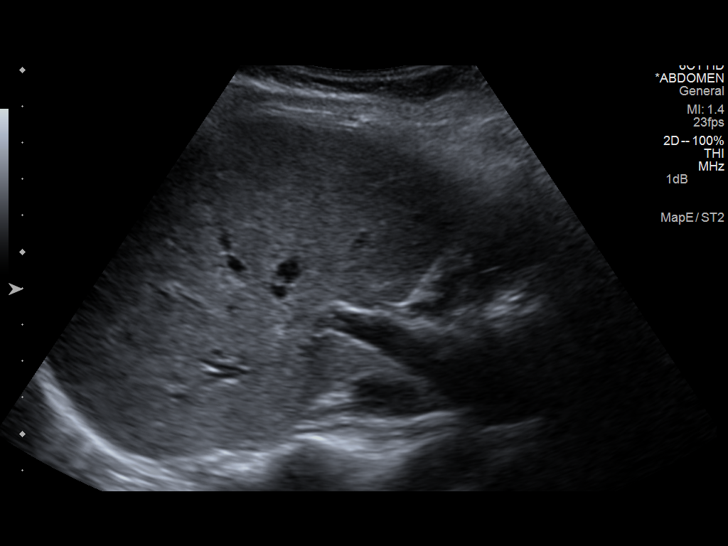
[im 20/43]
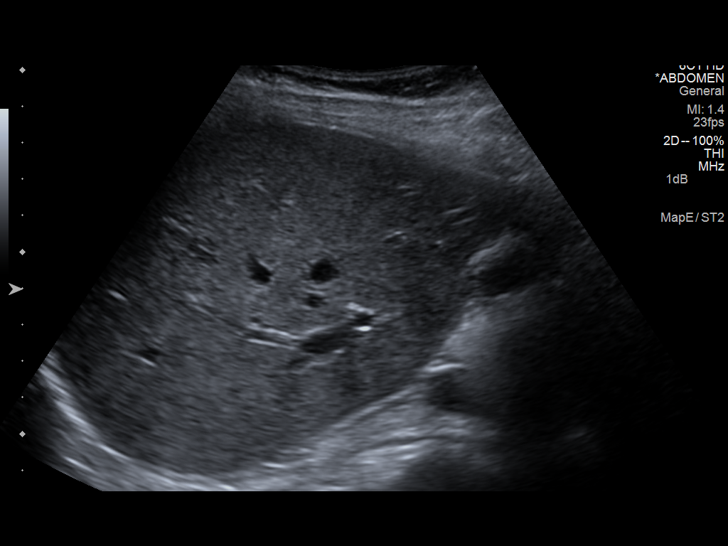
[im 23/43]
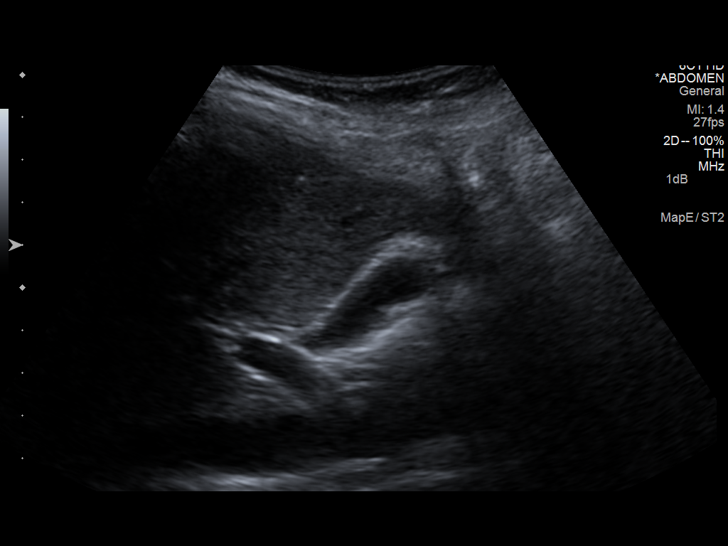
[im 27/43]
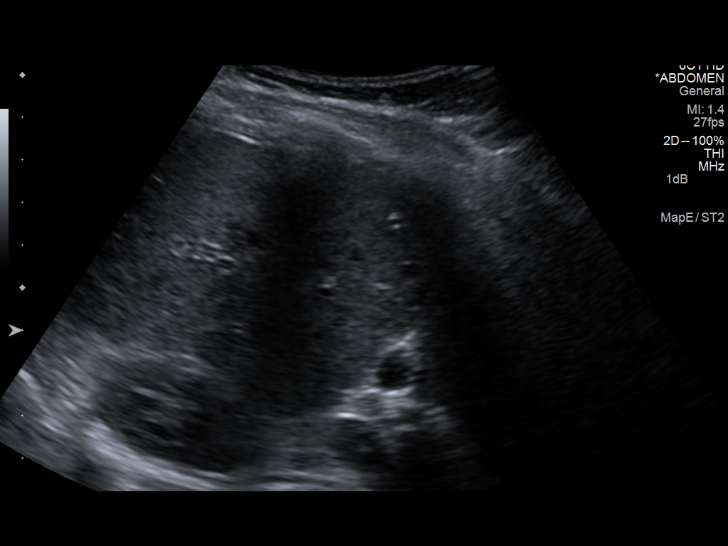
[im 29/43]
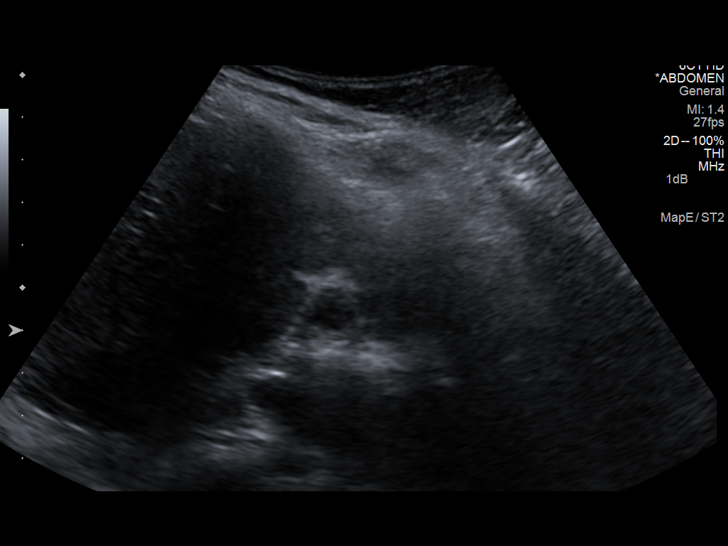
[im 32/43]
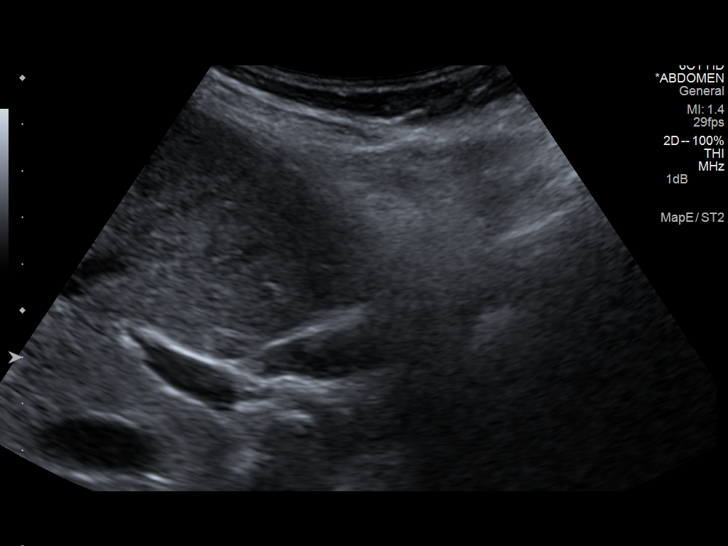
[im 36/43]
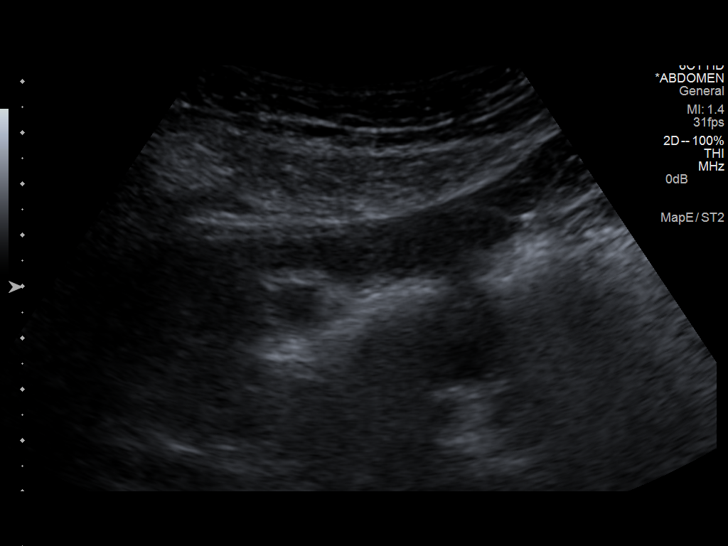
[im 39/43]
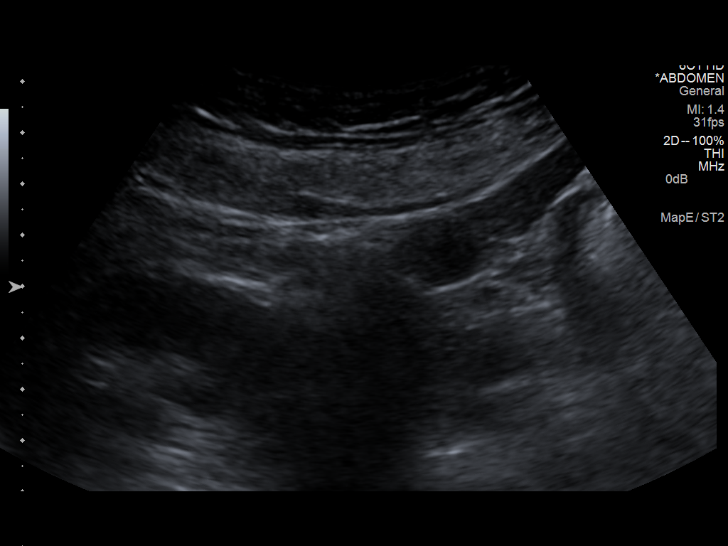
[im 43/43]
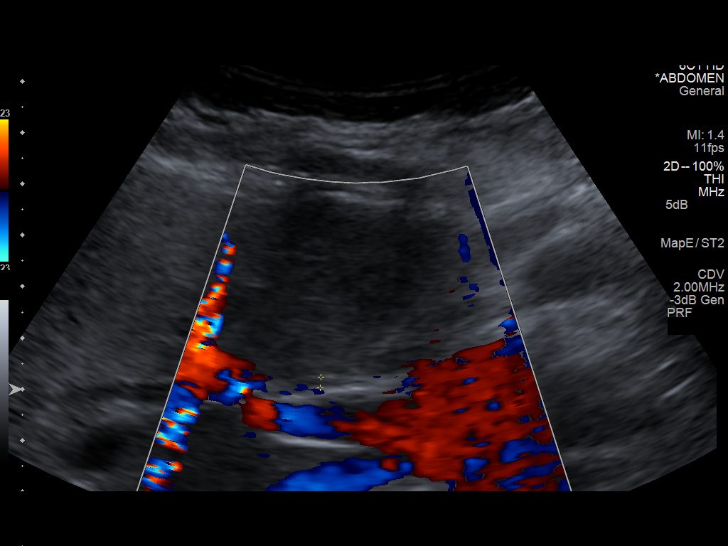

[14 of 25 positions shown; findings below may reference images not displayed]

FINDINGS: Gallbladder:

Contracted, patient not NPO prior to ultrasound exam. No definite
gallbladder wall thickening or calculi. No pericholecystic fluid or
sonographic Murphy sign.

Common bile duct:

Diameter: Normal caliber 2 mm diameter

Liver:

Normal appearance.  Hepatopetal portal venous flow.

No RIGHT upper quadrant free fluid.
IMPRESSION: Contracted gallbladder which could potentially be related to food
intake, as patient was not NPO prior to study.

Otherwise negative exam.

## 2016-06-01 ENCOUNTER — Encounter (HOSPITAL_COMMUNITY): Payer: Self-pay | Admitting: Emergency Medicine

## 2016-06-01 ENCOUNTER — Emergency Department (HOSPITAL_COMMUNITY): Payer: Medicaid Other

## 2016-06-01 ENCOUNTER — Emergency Department (HOSPITAL_COMMUNITY)
Admission: EM | Admit: 2016-06-01 | Discharge: 2016-06-01 | Disposition: A | Discharge status: Home / Self Care | Payer: Medicaid Other | Attending: Emergency Medicine | Admitting: Emergency Medicine

## 2016-06-01 DIAGNOSIS — R079 Chest pain, unspecified: Secondary | ICD-10-CM

## 2016-06-01 DIAGNOSIS — R0789 Other chest pain: Secondary | ICD-10-CM | POA: Diagnosis not present

## 2016-06-01 LAB — BASIC METABOLIC PANEL
Anion gap: 10 (ref 5–15)
BUN: 17 mg/dL (ref 6–20)
CHLORIDE: 102 mmol/L (ref 101–111)
CO2: 25 mmol/L (ref 22–32)
Calcium: 9.5 mg/dL (ref 8.9–10.3)
Creatinine, Ser: 0.74 mg/dL (ref 0.44–1.00)
GFR calc Af Amer: >60 mL/min (ref >60)
GFR calc non Af Amer: >60 mL/min (ref >60)
Glucose, Bld: 123 mg/dL — ABNORMAL HIGH (ref 65–99)
Potassium: 3.6 mmol/L (ref 3.5–5.1)
SODIUM: 137 mmol/L (ref 135–145)

## 2016-06-01 LAB — CBC
HCT: 39.2 % (ref 36.0–46.0)
Hemoglobin: 12.1 g/dL (ref 12.0–15.0)
MCH: 23.9 pg — ABNORMAL LOW (ref 26.0–34.0)
MCHC: 30.9 g/dL (ref 30.0–36.0)
MCV: 77.5 fL — AB (ref 78.0–100.0)
Platelets: 309 10*3/uL (ref 150–400)
RBC: 5.06 MIL/uL (ref 3.87–5.11)
RDW: 14.8 % (ref 11.5–15.5)
WBC: 8.3 10*3/uL (ref 4.0–10.5)

## 2016-06-01 LAB — D-DIMER, QUANTITATIVE: D-Dimer, Quant: 0.72 ug/mL-FEU — ABNORMAL HIGH (ref 0.00–0.50)

## 2016-06-01 LAB — I-STAT TROPONIN, ED: Troponin i, poc: 0 ng/mL (ref 0.00–0.08)

## 2016-06-01 LAB — POC URINE PREG, ED: Preg Test, Ur: NEGATIVE

## 2016-06-01 MED ORDER — NAPROXEN 500 MG PO TABS: 500.0000 mg | ORAL_TABLET | Freq: Two times a day (BID) | ORAL | 0 refills | Status: DC

## 2016-06-01 MED ORDER — KETOROLAC TROMETHAMINE 15 MG/ML IJ SOLN
15.0000 mg | Freq: Once | INTRAMUSCULAR | Status: AC
  Administered 2016-06-01: 15 mg via INTRAVENOUS
  Filled 2016-06-01: qty 1

## 2016-06-01 MED ORDER — IOPAMIDOL (ISOVUE-370) INJECTION 76%
INTRAVENOUS | Status: AC
  Administered 2016-06-01: 100 mL
  Filled 2016-06-01: qty 100

## 2016-06-01 MED ORDER — FAMOTIDINE 20 MG PO TABS
20.0000 mg | ORAL_TABLET | Freq: Once | ORAL | Status: AC
  Administered 2016-06-01: 20 mg via ORAL
  Filled 2016-06-01: qty 1

## 2016-06-01 MED ORDER — ONDANSETRON HCL 4 MG/2ML IJ SOLN
4.0000 mg | Freq: Once | INTRAMUSCULAR | Status: AC
  Administered 2016-06-01: 4 mg via INTRAVENOUS
  Filled 2016-06-01: qty 2

## 2016-06-01 MED ORDER — GI COCKTAIL ~~LOC~~
30.0000 mL | Freq: Once | ORAL | Status: AC
  Administered 2016-06-01: 30 mL via ORAL
  Filled 2016-06-01: qty 30

## 2016-06-01 MED ORDER — MORPHINE SULFATE (PF) 4 MG/ML IV SOLN
4.0000 mg | Freq: Once | INTRAVENOUS | Status: AC
  Administered 2016-06-01: 4 mg via INTRAVENOUS
  Filled 2016-06-01: qty 1

## 2016-06-01 NOTE — ED Triage Notes (Signed)
[facility] reports pt coming in for chest pain that woke her up at 0400 this date. EMS reports the pt rates her pain a 10 out of 10. Chest is tender upon palpation. !2 lead unremarkable. Vitals stable.   Pt states she is having pain in the middle of her chest. Pt denies any injury and does not take birth control. Reports her pain a 10 out of 10.

## 2016-06-01 NOTE — ED Provider Notes (Signed)
[facility]Y DEPT Provider Note   CSN: [id number] Arrival date & time: 06/01/16  0445    History   Chief Complaint Chief Complaint  Patient presents with  . Chest Pain    HPI Jackie Brooks is a 31 y.o. female.  30 year old female with no significant past medical history presents to the emergency department for evaluation of chest pain. Patient reports that chest pain is nonradiating, located in the center of her chest. She reports associated shortness of breath and currently rates her pain at 9/10. No medications taken prior to arrival. She denies a history of similar pain, stating that it feels sharp. No associated diaphoresis or fever. No recent trauma or injury. No PHx of ACS or tobacco use. Tachycardia noted on arrival.   The history is provided by the patient. No language interpreter was used.  Chest Pain      Past Medical History:  Diagnosis Date  . Headache(784.0)   . Infection    UTI  . Medical history non-contributory     Patient Active Problem List   Diagnosis Date Noted  . Ileus, postoperative (HCC) 12/15/2012  . Edema 12/15/2012  . Anemia 12/14/2012  . Status post primary low transverse cesarean section 12/14/2012  . Post-dates pregnancy 12/09/2012  . Late prenatal care 09/02/2012  . Migraines 09/02/2012    Past Surgical History:  Procedure Laterality Date  . CESAREAN SECTION N/A 12/11/2012   Procedure: CESAREAN SECTION;  Surgeon: Geryl Rankins, MD;  Location: [facility];  Service: Obstetrics;  Laterality: N/A;  . NO PAST SURGERIES      OB History    Gravida Para Term Preterm AB Living   0 0 1   SAB TAB Ectopic Multiple Live Births   0 0 0 0 1       Home Medications    Prior to Admission medications   Medication Sig Start Date End Date Taking? Authorizing Provider  norgestimate-ethinyl estradiol (SPRINTEC 28) 0.25-35 MG-MCG tablet Take 1 tablet by mouth daily. Patient not taking: Reported on 06/01/2016 11/15/14   Marily Memos, MD     Family History Family History  Problem Relation Age of Onset  . Cancer Paternal Aunt     ? brain  . Cancer Paternal Grandmother     breast  . Hypertension Mother     Social History Social History  Substance Use Topics  . Smoking status: Never Smoker  . Smokeless tobacco: Never Used  . Alcohol use No     Allergies   Patient has no known allergies.   Review of Systems Review of Systems  Cardiovascular: Positive for chest pain.  Ten systems reviewed and are negative for acute change, except as noted in the HPI.    Physical Exam Updated Vital Signs BP 116/83 (BP Location: Right Arm)   Pulse (!) 103   Temp 98.7 F (37.1 C) (Oral)   Resp 18   Ht  (1.499 m)   Wt 59 kg   LMP 05/30/2016   SpO2 100%   BMI 26.26 kg/m   Physical Exam  Constitutional: She is oriented to person, place, and time. She appears well-developed and well-nourished. No distress.  Nontoxic and in NAD  HENT:  Head: Normocephalic and atraumatic.  Eyes: Conjunctivae and EOM are normal. No scleral icterus.  Neck: Normal range of motion.  Pulmonary/Chest: Effort normal. No respiratory distress.  Respirations even and unlabored. Chest expansion symmetric.  Musculoskeletal: Normal range of motion.  Neurological: She is alert and  oriented to person, place, and time. She exhibits normal muscle tone. Coordination normal.  GCS 15. Patient moving all extremities.  Skin: Skin is warm and dry. No rash noted. She is not diaphoretic. No erythema. No pallor.  Psychiatric: She has a normal mood and affect. Her behavior is normal.  Nursing note and vitals reviewed.    ED Treatments / Results  Labs (all labs ordered are listed, but only abnormal results are displayed) Labs Reviewed  CBC - Abnormal; Notable for the following:       Result Value   MCV 77.5 (*)    MCH 23.9 (*)    All other components within normal limits  BASIC METABOLIC PANEL  D-DIMER, QUANTITATIVE (NOT AT [facility])  Rosezena Sensor, ED  POC URINE PREG, ED    EKG  EKG Interpretation  Date/Time:  Monday June 01 2016 04:54:55 EDT Ventricular Rate:  87 PR Interval:    QRS Duration: 70 QT Interval:  345 QTC Calculation: 415 R Axis:   48 Text Interpretation:  Sinus rhythm RSR' in V1 or V2, probably normal variant ST elev, probable normal early repol pattern Confirmed by HORTON  MD, Toni Amend ([id number]) on 06/01/2016 4:57:17 AM       Radiology No results found.  Procedures Procedures (including critical care time)  Medications Ordered in ED Medications  gi cocktail (Maalox,Lidocaine,Donnatal) (not administered)  ketorolac (TORADOL) 15 MG/ML injection 15 mg (not administered)     Initial Impression / Assessment and Plan / ED Course  I have reviewed the triage vital signs and the nursing notes.  Pertinent labs & imaging results that were available during my care of the patient were reviewed by me and considered in my medical decision making (see chart for details).     5:42 AM Initial troponin negative. EKG without acute ischemic changes. Patient appears stable on initial assessment. Chest expansion symmetric. No hypoxia. Patient transported to Xray.  6:01 AM Patient signed out to Fayrene Helper, PA-C at shift change pending D dimer to r/o PE.   Final Clinical Impressions(s) / ED Diagnoses   Final diagnoses:  Nonspecific chest pain    New Prescriptions New Prescriptions   No medications on file     Antony Madura, PA-C 06/01/16 0602    Shon Baton, MD 06/01/16 [phone]

## 2016-06-01 NOTE — ED Provider Notes (Signed)
Received patient sign out at the beginning of shift. Patient here with reproducible anterior chest wall pain and epigastric pain that started earlier this morning with associated pleuritic chest pain. A d-dimer was obtained and it was positive at 0.72. Her labs are otherwise reassuring. Pain medication given for her pain, plan to obtain chest CT angiogram to rule out PE.  8:02 AM PE study negative.  Labs are reassuring.  Reproducible chest wall pain.  No other concerning feature. Encourage pt to f/u outpt with PCP for further evaluation.  Naproxen for pain.  Return precaution given.   BP (!) 108/57   Pulse 68   Temp 98.7 F (37.1 C) (Oral)   Resp (!) 22   Ht  (1.499 m)   Wt 59 kg   LMP 05/30/2016   SpO2 100%   BMI 26.26 kg/m   Results for orders placed or performed during the hospital encounter of 06/01/16  Basic metabolic panel  Result Value Ref Range   Sodium 137 135 - 145 mmol/L   Potassium 3.6 3.5 - 5.1 mmol/L   Chloride 102 101 - 111 mmol/L   CO2 25 22 - 32 mmol/L   Glucose, Bld 123 (H) 65 - 99 mg/dL   BUN 17 6 - 20 mg/dL   Creatinine, Ser 1.19 0.44 - 1.00 mg/dL   Calcium 9.5 8.9 - 14.7 mg/dL   GFR calc non Af Amer >60 >60 mL/min   GFR calc Af Amer >60 >60 mL/min   Anion gap 10 5 - 15  CBC  Result Value Ref Range   WBC 8.3 4.0 - 10.5 K/uL   RBC 5.06 3.87 - 5.11 MIL/uL   Hemoglobin 12.1 12.0 - 15.0 g/dL   HCT 82.9 56.2 - 13.0 %   MCV 77.5 (L) 78.0 - 100.0 fL   MCH 23.9 (L) 26.0 - 34.0 pg   MCHC 30.9 30.0 - 36.0 g/dL   RDW 86.5 78.4 - 69.6 %   Platelets 309 150 - 400 K/uL  D-dimer, quantitative (not at [facility])  Result Value Ref Range   D-Dimer, Quant 0.72 (H) 0.00 - 0.50 ug/mL-FEU  I-stat troponin, ED  Result Value Ref Range   Troponin i, poc 0.00 0.00 - 0.08 ng/mL   Comment 3          POC Urine Pregnancy, ED (do NOT order at [facility])  Result Value Ref Range   Preg Test, Ur NEGATIVE NEGATIVE   Dg Chest 2 View  Result Date: 06/01/2016 CLINICAL DATA:  Acute  onset of generalized chest pain. Initial encounter. EXAM: CHEST  2 VIEW COMPARISON:  Chest radiograph performed 01/07/2014 FINDINGS: The lungs are well-aerated and clear. There is no evidence of focal opacification, pleural effusion or pneumothorax. The heart is normal in size; the mediastinal contour is within normal limits. No acute osseous abnormalities are seen. IMPRESSION: No acute cardiopulmonary process seen. Electronically Signed   By: Roanna Raider M.D.   On: 06/01/2016 05:50   Ct Angio Chest Pe W Or Wo Contrast  Result Date: 06/01/2016 CLINICAL DATA:  Chest pain, tachycardia, positive D-dimer. Shortness of breath, chest tightness. EXAM: CT ANGIOGRAPHY CHEST WITH CONTRAST TECHNIQUE: Multidetector CT imaging of the chest was performed using the standard protocol during bolus administration of intravenous contrast. Multiplanar CT image reconstructions and MIPs were obtained to evaluate the vascular anatomy. CONTRAST:  80 cc Isovue 370. COMPARISON:  Chest radiograph 06/01/2016. FINDINGS: Cardiovascular: Negative for pulmonary embolus. Heart is at the upper limits of normal in size.  No pericardial effusion. Mediastinum/Nodes: Prevascular soft tissue conforms to the expected shape of the thymus. No pathologically enlarged mediastinal, hilar or axillary lymph nodes. Esophagus is grossly unremarkable. Lungs/Pleura: Lungs are clear. No pleural fluid. Airway is unremarkable. Upper Abdomen: Visualized portions of the liver and spleen are grossly unremarkable. Musculoskeletal: No worrisome lytic or sclerotic lesions. Review of the MIP images confirms the above findings. IMPRESSION: Negative for pulmonary embolus. No findings to explain the patient's symptoms. Electronically Signed   By: Leanna Battles M.D.   On: 06/01/2016 07:50      Fayrene Helper, PA-C 06/01/16 [id number]    Shon Baton, MD 06/04/16 [phone]

## 2016-06-01 NOTE — ED Notes (Signed)
Pt vomited immediately after taking the GI cocktail. 

## 2016-06-01 NOTE — Discharge Instructions (Signed)
Please use number available in this package to find a primary care provider for further evaluation and management of your pain.  Take Naproxen as needed for pain.

## 2016-06-01 NOTE — ED Notes (Signed)
Patient transported to CT 

## 2016-06-01 NOTE — ED Notes (Addendum)
EDP at bedside  

## 2017-10-14 DIAGNOSIS — Z1388 Encounter for screening for disorder due to exposure to contaminants: Secondary | ICD-10-CM | POA: Diagnosis not present

## 2017-10-14 DIAGNOSIS — Z0389 Encounter for observation for other suspected diseases and conditions ruled out: Secondary | ICD-10-CM | POA: Diagnosis not present

## 2017-10-14 DIAGNOSIS — N760 Acute vaginitis: Secondary | ICD-10-CM | POA: Diagnosis not present

## 2017-10-14 DIAGNOSIS — Z113 Encounter for screening for infections with a predominantly sexual mode of transmission: Secondary | ICD-10-CM | POA: Diagnosis not present

## 2017-10-14 DIAGNOSIS — Z3009 Encounter for other general counseling and advice on contraception: Secondary | ICD-10-CM | POA: Diagnosis not present

## 2017-10-14 DIAGNOSIS — Z114 Encounter for screening for human immunodeficiency virus [HIV]: Secondary | ICD-10-CM | POA: Diagnosis not present

## 2017-12-15 DIAGNOSIS — N760 Acute vaginitis: Secondary | ICD-10-CM | POA: Diagnosis not present

## 2017-12-15 DIAGNOSIS — Z3009 Encounter for other general counseling and advice on contraception: Secondary | ICD-10-CM | POA: Diagnosis not present

## 2017-12-15 DIAGNOSIS — Z0389 Encounter for observation for other suspected diseases and conditions ruled out: Secondary | ICD-10-CM | POA: Diagnosis not present

## 2017-12-15 DIAGNOSIS — Z113 Encounter for screening for infections with a predominantly sexual mode of transmission: Secondary | ICD-10-CM | POA: Diagnosis not present

## 2017-12-15 DIAGNOSIS — Z1388 Encounter for screening for disorder due to exposure to contaminants: Secondary | ICD-10-CM | POA: Diagnosis not present

## 2017-12-15 DIAGNOSIS — Z114 Encounter for screening for human immunodeficiency virus [HIV]: Secondary | ICD-10-CM | POA: Diagnosis not present

## 2018-02-18 DIAGNOSIS — N760 Acute vaginitis: Secondary | ICD-10-CM | POA: Diagnosis not present

## 2018-02-18 DIAGNOSIS — Z113 Encounter for screening for infections with a predominantly sexual mode of transmission: Secondary | ICD-10-CM | POA: Diagnosis not present

## 2018-04-01 DIAGNOSIS — Z113 Encounter for screening for infections with a predominantly sexual mode of transmission: Secondary | ICD-10-CM | POA: Diagnosis not present

## 2018-04-01 DIAGNOSIS — N761 Subacute and chronic vaginitis: Secondary | ICD-10-CM | POA: Diagnosis not present

## 2018-04-01 DIAGNOSIS — Z114 Encounter for screening for human immunodeficiency virus [HIV]: Secondary | ICD-10-CM | POA: Diagnosis not present

## 2018-04-01 DIAGNOSIS — N760 Acute vaginitis: Secondary | ICD-10-CM | POA: Diagnosis not present

## 2018-07-28 ENCOUNTER — Other Ambulatory Visit: Payer: Self-pay | Admitting: Family Medicine

## 2018-07-28 ENCOUNTER — Ambulatory Visit: Payer: Self-pay

## 2018-07-28 ENCOUNTER — Other Ambulatory Visit: Payer: Self-pay

## 2018-07-28 DIAGNOSIS — M25511 Pain in right shoulder: Secondary | ICD-10-CM

## 2018-09-13 DIAGNOSIS — M25511 Pain in right shoulder: Secondary | ICD-10-CM | POA: Insufficient documentation

## 2018-09-28 DIAGNOSIS — Z4789 Encounter for other orthopedic aftercare: Secondary | ICD-10-CM | POA: Insufficient documentation

## 2018-09-28 DIAGNOSIS — S43431A Superior glenoid labrum lesion of right shoulder, initial encounter: Secondary | ICD-10-CM | POA: Insufficient documentation

## 2018-09-28 HISTORY — PX: SHOULDER SURGERY: SHX246

## 2018-10-26 ENCOUNTER — Ambulatory Visit (INDEPENDENT_AMBULATORY_CARE_PROVIDER_SITE_OTHER): Payer: BC Managed Care – PPO | Admitting: Family Medicine

## 2018-10-26 ENCOUNTER — Encounter: Payer: Self-pay | Admitting: Family Medicine

## 2018-10-26 ENCOUNTER — Other Ambulatory Visit: Payer: Self-pay

## 2018-10-26 VITALS — BP 115/77 | HR 98 | Temp 98.9°F | Resp 12 | Wt 131.2 lb

## 2018-10-26 DIAGNOSIS — F5104 Psychophysiologic insomnia: Secondary | ICD-10-CM | POA: Diagnosis not present

## 2018-10-26 DIAGNOSIS — F4323 Adjustment disorder with mixed anxiety and depressed mood: Secondary | ICD-10-CM

## 2018-10-26 MED ORDER — HYDROXYZINE HCL 25 MG PO TABS: 12.5000 mg | ORAL_TABLET | Freq: Every evening | ORAL | 1 refills | Status: DC | PRN

## 2018-10-26 MED ORDER — SERTRALINE HCL 25 MG PO TABS: 25.0000 mg | ORAL_TABLET | Freq: Every day | ORAL | 1 refills | Status: DC

## 2018-10-26 NOTE — Patient Instructions (Addendum)
See information below on adjustment disorder.  I appreciate you coming in today to discuss the symptoms. For sleep, can try hydroxyzine 1/2 -1 pill at bedtime in place of benadryl.  See other information below  Please call therapist, here is one option: [facility]: [phone]  Continue exercise/walking and other stress management techniques that have worked for you before.  Start Zoloft once per day.  Recheck with me in 3 weeks, sooner if new or worsening symptoms.  Let me know if there are questions.   Adjustment Disorder, Adult Adjustment disorder is a group of symptoms that can develop after a stressful life event, such as the loss of a job or serious physical illness. The symptoms can affect how you feel, think, and act. They may interfere with your relationships. Adjustment disorder increases your risk of suicide and substance abuse. If this disorder is not managed early, it can develop into a more serious condition, such as major depressive disorder or post-traumatic stress disorder. What are the causes? This condition happens when you have trouble recovering from or coping with a stressful life event. What increases the risk? You are more likely to develop this condition if:  You have had depression or anxiety.  You are being treated for a long-term (chronic) illness.  You are being treated for an illness that cannot be cured (terminal illness).  You have a family history of mental illness. What are the signs or symptoms? Symptoms of this condition include:  Extreme trouble doing daily tasks, such as going to work.  Sadness, depression, or crying spells.  Worrying a lot.  Loss of enjoyment.  Change in appetite or weight.  Feelings of loss or hopelessness.  Thoughts of suicide.  Anxiety, worry, or nervousness.  Trouble sleeping.  Avoiding family and friends.  Fighting or vandalism.  Complaining of feeling sick without being ill.  Feeling  dazed or disconnected.  Nightmares.  Trouble sleeping.  Irritability.  Reckless driving.  Poor work International aid/development workerperformance.  Ignoring bills. Symptoms of this condition start within three months of the stressful event. They do not last more than six months, unless the stressful circumstances last longer. Normal grieving after the death of a loved one is not a symptom of this condition. How is this diagnosed? To diagnose this condition, your health care provider will ask about what has happened in your life and how it has affected you. He or she may also ask about your medical history and your use of medicines, alcohol, and other substances. Your health care provider may do a physical exam and order lab tests or other studies. You may be referred to a mental health specialist. How is this treated? Treatment options for this condition include:  Counseling or talk therapy. Talk therapy is usually provided by mental health specialists.  Medicines. Certain medicines may help with depression, anxiety, and sleep.  Support groups. These offer emotional support, advice, and guidance. They are made up of people who have had similar experiences.  Observation and time. This is sometimes called "watchful waiting." In this treatment, health care providers monitor your health and behavior without other treatment. Adjustment disorder sometimes gets better on its own with time. Follow these instructions at home:  Take over-the-counter and prescription medicines only as told by your health care provider.  Keep all follow-up visits as told by your health care provider. This is important. Contact a health care provider if:  Your symptoms do not improve in six months.  Your symptoms get worse.  Get help right away if:  You have serious thoughts about hurting yourself or someone else. If you ever feel like you may hurt yourself or others, or have thoughts about taking your own life, get help right away. You can  go to your nearest emergency department or call:  Your local emergency services (911 in the [place]  A suicide crisis helpline, such as the [place] at 704-671-0805. This is open 24 hours a day. Summary  Adjustment disorder is a group of symptoms that can develop after a stressful life event, such as the loss of a job or serious physical illness. The symptoms can affect how you feel, think, and act. They may interfere with your relationships.  Symptoms of this condition start within three months of the stressful event. They do not last more than six months, unless the stressful circumstances last longer.  Treatment may include talk therapy, medicines, participation in a support group, or observation to see if symptoms improve.  Contact your health care provider if your symptoms get worse or do not improve in six months.  If you ever feel like you may hurt yourself or others, or have thoughts about taking your own life, get help right away. This information is not intended to replace advice given to you by your health care provider. Make sure you discuss any questions you have with your health care provider. Document Released: 10/14/2005 Document Revised: 01/22/2017 Document Reviewed: 04/10/2016 Elsevier Patient Education  [place].  Insomnia Insomnia is a sleep disorder that makes it difficult to fall asleep or stay asleep. Insomnia can cause fatigue, low energy, difficulty concentrating, mood swings, and poor performance at work or school. There are three different ways to classify insomnia:  Difficulty falling asleep.  Difficulty staying asleep.  Waking up too early in the morning. Any type of insomnia can be long-term (chronic) or short-term (acute). Both are common. Short-term insomnia usually lasts for three months or less. Chronic insomnia occurs at least three times a week for longer than three months. What are the causes? Insomnia may be  caused by another condition, situation, or substance, such as:  Anxiety.  Certain medicines.  Gastroesophageal reflux disease (GERD) or other gastrointestinal conditions.  Asthma or other breathing conditions.  Restless legs syndrome, sleep apnea, or other sleep disorders.  Chronic pain.  Menopause.  Stroke.  Abuse of alcohol, tobacco, or illegal drugs.  Mental health conditions, such as depression.  Caffeine.  Neurological disorders, such as Alzheimer's disease.  An overactive thyroid (hyperthyroidism). Sometimes, the cause of insomnia may not be known. What increases the risk? Risk factors for insomnia include:  Gender. Women are affected more often than men.  Age. Insomnia is more common as you get older.  Stress.  Lack of exercise.  Irregular work schedule or working night shifts.  Traveling between different time zones.  Certain medical and mental health conditions. What are the signs or symptoms? If you have insomnia, the main symptom is having trouble falling asleep or having trouble staying asleep. This may lead to other symptoms, such as:  Feeling fatigued or having low energy.  Feeling nervous about going to sleep.  Not feeling rested in the morning.  Having trouble concentrating.  Feeling irritable, anxious, or depressed. How is this diagnosed? This condition may be diagnosed based on:  Your symptoms and medical history. Your health care provider may ask about: ? Your sleep habits. ? Any medical conditions you have. ? Your mental health.  A physical exam. How is this treated? Treatment for insomnia depends on the cause. Treatment may focus on treating an underlying condition that is causing insomnia. Treatment may also include:  Medicines to help you sleep.  Counseling or therapy.  Lifestyle adjustments to help you sleep better. Follow these instructions at home: Eating and drinking   Limit or avoid alcohol, caffeinated  beverages, and cigarettes, especially close to bedtime. These can disrupt your sleep.  Do not eat a large meal or eat spicy foods right before bedtime. This can lead to digestive discomfort that can make it hard for you to sleep. Sleep habits   Keep a sleep diary to help you and your health care provider figure out what could be causing your insomnia. Write down: ? When you sleep. ? When you wake up during the night. ? How well you sleep. ? How rested you feel the next day. ? Any side effects of medicines you are taking. ? What you eat and drink.  Make your bedroom a dark, comfortable place where it is easy to fall asleep. ? Put up shades or blackout curtains to block light from outside. ? Use a white noise machine to block noise. ? Keep the temperature cool.  Limit screen use before bedtime. This includes: ? Watching TV. ? Using your smartphone, tablet, or computer.  Stick to a routine that includes going to bed and waking up at the same times every day and night. This can help you fall asleep faster. Consider making a quiet activity, such as reading, part of your nighttime routine.  Try to avoid taking naps during the day so that you sleep better at night.  Get out of bed if you are still awake after 15 minutes of trying to sleep. Keep the lights down, but try reading or doing a quiet activity. When you feel sleepy, go back to bed. General instructions  Take over-the-counter and prescription medicines only as told by your health care provider.  Exercise regularly, as told by your health care provider. Avoid exercise starting several hours before bedtime.  Use relaxation techniques to manage stress. Ask your health care provider to suggest some techniques that may work well for you. These may include: ? Breathing exercises. ? Routines to release muscle tension. ? Visualizing peaceful scenes.  Make sure that you drive carefully. Avoid driving if you feel very sleepy.  Keep  all follow-up visits as told by your health care provider. This is important. Contact a health care provider if:  You are tired throughout the day.  You have trouble in your daily routine due to sleepiness.  You continue to have sleep problems, or your sleep problems get worse. Get help right away if:  You have serious thoughts about hurting yourself or someone else. If you ever feel like you may hurt yourself or others, or have thoughts about taking your own life, get help right away. You can go to your nearest emergency department or call:  Your local emergency services (911 in the [place]  A suicide crisis helpline, such as the National Suicide Prevention Lifeline at 6604603856. This is open 24 hours a day. Summary  Insomnia is a sleep disorder that makes it difficult to fall asleep or stay asleep.  Insomnia can be long-term (chronic) or short-term (acute).  Treatment for insomnia depends on the cause. Treatment may focus on treating an underlying condition that is causing insomnia.  Keep a sleep diary to help you and your health care provider  figure out what could be causing your insomnia. This information is not intended to replace advice given to you by your health care provider. Make sure you discuss any questions you have with your health care provider. Document Released: 02/07/2000 Document Revised: 01/22/2017 Document Reviewed: 11/19/2016 Elsevier Patient Education  [employer]2020 [employer]    If you have lab work done today you will be contacted with your lab results within the next 2 weeks.  If you have not heard from us then please contact us. The fastest way to get your results is to register for My Chart.   IF you received an x-ray today, you will receive an invoice from [facility]. Please contact [facility] at [phone] with questions or concerns regarding your invoice.   IF you received labwork today, you will receive an invoice from [facility].  Please contact [facility] at [phone] with questions or concerns regarding your invoice.   Our billing staff will not be able to assist you with questions regarding bills from these companies.  You will be contacted with the lab results as soon as they are available. The fastest way to get your results is to activate your My Chart account. Instructions are located on the last page of this paperwork. If you have not heard from us regarding the results in 2 weeks, please contact this office.

## 2018-10-26 NOTE — Progress Notes (Signed)
Subjective:    Patient ID: Jackie Brooks, female    DOB: 1985/04/01, 33 y.o.   MRN: [id number]  HPI Jackie Brooks is a 33 y.o. female Presents today for: Chief Complaint  Patient presents with  . Establish Care    to talk about depression that was mention to the Other surgeon and he referred me to Dr Berta MinorGreene GAD7=21, PHQ9=24   Here for evaluation of depression symptoms.  Injured at work 5/4. had some issues in R shoulder, muscle tear bursitis. Treated by Dr. Aundria Rudogers at [facility].  Surgery 09/28/18. On oxycodone 2 every 4 hours as needed- 4 per day for persistent pain. Since initial injury months ago, unable to do as much, R hand dominant, initial light duty, then out of work since about July 7th. single parent, 995 yo daughter at home. Trouble doing activities with her. Bothers her that she can not do what she wants to do. Not in a mood to go outside, decreased motivation since out of work - nothing good coming out of things right now. Harassed by landlord, waiting on disability.  Some family help, but they work as well.  Worse since surgery, trouble sleeping, can't sleep. Tried benadryl 25mg  QHS past few weeks. Able to sleep with using benadryl. Weight gain after decreased care of diet - more sodas.  No prior history of depression/anxiety or treatment.  Has been trying to walk, treadmill, music - more activity past few weeks.  No SI/HI.  PHQ 24, gad-7 21.  Not involved with church, no therapist.  Crying at times.  No thyroid issues prior.  Alcohol: 1 wine cooler per week.  No IDU.  No manic symptoms.   Depression screen PHQ 2/9 10/26/2018  Decreased Interest 3  Down, Depressed, Hopeless 3  PHQ - 2 Score 6  Altered sleeping 3  Tired, decreased energy 3  Change in appetite 3  Feeling bad or failure about yourself  3  Trouble concentrating 3  Moving slowly or fidgety/restless 3  Suicidal thoughts 0  PHQ-9 Score 24  Difficult doing work/chores Somewhat difficult   GAD 7 :  Generalized Anxiety Score 10/26/2018  Nervous, Anxious, on Edge 3  Control/stop worrying 3  Worry too much - different things 3  Trouble relaxing 3  Restless 3  Easily annoyed or irritable 3  Afraid - awful might happen 3  Total GAD 7 Score 21  Anxiety Difficulty Somewhat difficult    Patient Active Problem List   Diagnosis Date Noted  . Ileus, postoperative (HCC) 12/15/2012  . Edema 12/15/2012  . Anemia 12/14/2012  . Status post primary low transverse cesarean section 12/14/2012  . Post-dates pregnancy 12/09/2012  . Late prenatal care 09/02/2012  . Migraines 09/02/2012   Past Medical History:  Diagnosis Date  . Headache(784.0)   . Infection    UTI  . Medical history non-contributory    Past Surgical History:  Procedure Laterality Date  . CESAREAN SECTION N/A 12/11/2012   Procedure: CESAREAN SECTION;  Surgeon: Geryl RankinsEvelyn Varnado, MD;  Location: [facility];  Service: Obstetrics;  Laterality: N/A;  . NO PAST SURGERIES    . SHOULDER SURGERY Right 09/28/2018   No Known Allergies Prior to Admission medications   Medication Sig Start Date End Date Taking? Authorizing Provider  acetaminophen (TYLENOL 8 HOUR) 650 MG CR tablet Take 650 mg by mouth every 8 (eight) hours as needed for pain.   Yes [provider]  diphenhydrAMINE (BENADRYL) 25 mg capsule Take 25 mg by mouth  every 6 (six) hours as needed.   Yes [provider]  oxyCODONE-acetaminophen (PERCOCET/ROXICET) 5-325 MG tablet Take by mouth every 4 (four) hours as needed for severe pain.   Yes [provider]  famotidine (PEPCID) 20 MG tablet Take 1 tablet (20 mg total) by mouth 2 (two) times daily. Patient not taking: Reported on 09/26/2014 09/02/13 11/15/14  Eber HongMiller, Brian, MD   Social History   Socioeconomic History  . Marital status: Single    Spouse name: Not on file  . Number of children: Not on file  . Years of education: Not on file  . Highest education level: Not on file  Occupational History  .  Not on file  Social Needs  . Financial resource strain: Not on file  . Food insecurity    Worry: Not on file    Inability: Not on file  . Transportation needs    Medical: Not on file    Non-medical: Not on file  Tobacco Use  . Smoking status: Never Smoker  . Smokeless tobacco: Never Used  Substance and Sexual Activity  . Alcohol use: No  . Drug use: No  . Sexual activity: Not Currently    Comment: 4 months agomonths ago  Lifestyle  . Physical activity    Days per week: Not on file    Minutes per session: Not on file  . Stress: Not on file  Relationships  . Social Musicianconnections    Talks on phone: Not on file    Gets together: Not on file    Attends religious service: Not on file    Active member of club or organization: Not on file    Attends meetings of clubs or organizations: Not on file    Relationship status: Not on file  . Intimate partner violence    Fear of current or ex partner: Not on file    Emotionally abused: Not on file    Physically abused: Not on file    Forced sexual activity: Not on file  Other Topics Concern  . Not on file  Social History Narrative   ** Merged History Encounter **        Review of Systems  Psychiatric/Behavioral: Positive for sleep disturbance. Negative for suicidal ideas.   Per HPI    Objective:   Physical Exam Vitals signs reviewed.  Constitutional:      General: She is not in acute distress.    Appearance: She is well-developed.  HENT:     Head: Normocephalic and atraumatic.  Neck:     Thyroid: No thyroid mass, thyromegaly or thyroid tenderness.  Cardiovascular:     Rate and Rhythm: Normal rate and regular rhythm.  Pulmonary:     Effort: Pulmonary effort is normal.  Neurological:     Mental Status: She is alert and oriented to person, place, and time.    Vitals:   10/26/18 1522  BP: 115/77  Pulse: 98  Resp: 12  Temp: 98.9 F (37.2 C)  TempSrc: Oral  SpO2: 98%  Weight: 131 lb 3.2 oz (59.5 kg)       Assessment & Plan:    Jackie Brooks is a 33 y.o. female Adjustment disorder with mixed anxiety and depressed mood - Plan: sertraline (ZOLOFT) 25 MG tablet Psychophysiological insomnia - Plan: hydrOXYzine (ATARAX/VISTARIL) 25 MG tablet  -Suspected adjustment disorder with worsening symptoms, related to her injury at work and persistent pain/debility.  May have component of depression in addition to the adjustment disorder.  Persistent insomnia.  -Various treatment approaches discussed, but did agree to start Zoloft for now, potential side effects discussed.  Importance of counseling also discussed, number provided.  Continue physical activity/exercise as well as other coping techniques as above.  -Trial of hydroxyzine low-dose at bedtime in place of Benadryl, or can revert to Benadryl if more effective.  Anticipate short-term treatment until other treatment above more effective.  Handout given on insomnia and adjustment disorder  -Recheck 3 weeks, sooner if worse.  Meds ordered this encounter  Medications  . hydrOXYzine (ATARAX/VISTARIL) 25 MG tablet    Sig: Take 0.5-1 tablets (12.5-25 mg total) by mouth at bedtime as needed for anxiety.    Dispense:  30 tablet    Refill:  1  . sertraline (ZOLOFT) 25 MG tablet    Sig: Take 1 tablet (25 mg total) by mouth daily.    Dispense:  90 tablet    Refill:  1   Patient Instructions   See information below on adjustment disorder.  I appreciate you coming in today to discuss the symptoms. For sleep, can try hydroxyzine 1/2 -1 pill at bedtime in place of benadryl.  See other information below  Please call therapist, here is one option: [facility]: [phone]  Continue exercise/walking and other stress management techniques that have worked for you before.  Start Zoloft once per day.  Recheck with me in 3 weeks, sooner if new or worsening symptoms.  Let me know if there are questions.   Adjustment Disorder, Adult Adjustment  disorder is a group of symptoms that can develop after a stressful life event, such as the loss of a job or serious physical illness. The symptoms can affect how you feel, think, and act. They may interfere with your relationships. Adjustment disorder increases your risk of suicide and substance abuse. If this disorder is not managed early, it can develop into a more serious condition, such as major depressive disorder or post-traumatic stress disorder. What are the causes? This condition happens when you have trouble recovering from or coping with a stressful life event. What increases the risk? You are more likely to develop this condition if:  You have had depression or anxiety.  You are being treated for a long-term (chronic) illness.  You are being treated for an illness that cannot be cured (terminal illness).  You have a family history of mental illness. What are the signs or symptoms? Symptoms of this condition include:  Extreme trouble doing daily tasks, such as going to work.  Sadness, depression, or crying spells.  Worrying a lot.  Loss of enjoyment.  Change in appetite or weight.  Feelings of loss or hopelessness.  Thoughts of suicide.  Anxiety, worry, or nervousness.  Trouble sleeping.  Avoiding family and friends.  Fighting or vandalism.  Complaining of feeling sick without being ill.  Feeling dazed or disconnected.  Nightmares.  Trouble sleeping.  Irritability.  Reckless driving.  Poor work Systems analyst.  Ignoring bills. Symptoms of this condition start within three months of the stressful event. They do not last more than six months, unless the stressful circumstances last longer. Normal grieving after the death of a loved one is not a symptom of this condition. How is this diagnosed? To diagnose this condition, your health care provider will ask about what has happened in your life and how it has affected you. He or she may also ask about your  medical history and your use of medicines, alcohol, and other substances. Your  health care provider may do a physical exam and order lab tests or other studies. You may be referred to a mental health specialist. How is this treated? Treatment options for this condition include:  Counseling or talk therapy. Talk therapy is usually provided by mental health specialists.  Medicines. Certain medicines may help with depression, anxiety, and sleep.  Support groups. These offer emotional support, advice, and guidance. They are made up of people who have had similar experiences.  Observation and time. This is sometimes called "watchful waiting." In this treatment, health care providers monitor your health and behavior without other treatment. Adjustment disorder sometimes gets better on its own with time. Follow these instructions at home:  Take over-the-counter and prescription medicines only as told by your health care provider.  Keep all follow-up visits as told by your health care provider. This is important. Contact a health care provider if:  Your symptoms do not improve in six months.  Your symptoms get worse. Get help right away if:  You have serious thoughts about hurting yourself or someone else. If you ever feel like you may hurt yourself or others, or have thoughts about taking your own life, get help right away. You can go to your nearest emergency department or call:  Your local emergency services (911 in the [place]  A suicide crisis helpline, such as the National Suicide Prevention Lifeline at 206-094-6847. This is open 24 hours a day. Summary  Adjustment disorder is a group of symptoms that can develop after a stressful life event, such as the loss of a job or serious physical illness. The symptoms can affect how you feel, think, and act. They may interfere with your relationships.  Symptoms of this condition start within three months of the stressful event. They do not  last more than six months, unless the stressful circumstances last longer.  Treatment may include talk therapy, medicines, participation in a support group, or observation to see if symptoms improve.  Contact your health care provider if your symptoms get worse or do not improve in six months.  If you ever feel like you may hurt yourself or others, or have thoughts about taking your own life, get help right away. This information is not intended to replace advice given to you by your health care provider. Make sure you discuss any questions you have with your health care provider. Document Released: 10/14/2005 Document Revised: 01/22/2017 Document Reviewed: 04/10/2016 Elsevier Patient Education  2020 Elsevier Inc.  Insomnia Insomnia is a sleep disorder that makes it difficult to fall asleep or stay asleep. Insomnia can cause fatigue, low energy, difficulty concentrating, mood swings, and poor performance at work or school. There are three different ways to classify insomnia:  Difficulty falling asleep.  Difficulty staying asleep.  Waking up too early in the morning. Any type of insomnia can be long-term (chronic) or short-term (acute). Both are common. Short-term insomnia usually lasts for three months or less. Chronic insomnia occurs at least three times a week for longer than three months. What are the causes? Insomnia may be caused by another condition, situation, or substance, such as:  Anxiety.  Certain medicines.  Gastroesophageal reflux disease (GERD) or other gastrointestinal conditions.  Asthma or other breathing conditions.  Restless legs syndrome, sleep apnea, or other sleep disorders.  Chronic pain.  Menopause.  Stroke.  Abuse of alcohol, tobacco, or illegal drugs.  Mental health conditions, such as depression.  Caffeine.  Neurological disorders, such as Alzheimer's disease.  An overactive  thyroid (hyperthyroidism). Sometimes, the cause of insomnia may  not be known. What increases the risk? Risk factors for insomnia include:  Gender. Women are affected more often than men.  Age. Insomnia is more common as you get older.  Stress.  Lack of exercise.  Irregular work schedule or working night shifts.  Traveling between different time zones.  Certain medical and mental health conditions. What are the signs or symptoms? If you have insomnia, the main symptom is having trouble falling asleep or having trouble staying asleep. This may lead to other symptoms, such as:  Feeling fatigued or having low energy.  Feeling nervous about going to sleep.  Not feeling rested in the morning.  Having trouble concentrating.  Feeling irritable, anxious, or depressed. How is this diagnosed? This condition may be diagnosed based on:  Your symptoms and medical history. Your health care provider may ask about: ? Your sleep habits. ? Any medical conditions you have. ? Your mental health.  A physical exam. How is this treated? Treatment for insomnia depends on the cause. Treatment may focus on treating an underlying condition that is causing insomnia. Treatment may also include:  Medicines to help you sleep.  Counseling or therapy.  Lifestyle adjustments to help you sleep better. Follow these instructions at home: Eating and drinking   Limit or avoid alcohol, caffeinated beverages, and cigarettes, especially close to bedtime. These can disrupt your sleep.  Do not eat a large meal or eat spicy foods right before bedtime. This can lead to digestive discomfort that can make it hard for you to sleep. Sleep habits   Keep a sleep diary to help you and your health care provider figure out what could be causing your insomnia. Write down: ? When you sleep. ? When you wake up during the night. ? How well you sleep. ? How rested you feel the next day. ? Any side effects of medicines you are taking. ? What you eat and drink.  Make your  bedroom a dark, comfortable place where it is easy to fall asleep. ? Put up shades or blackout curtains to block light from outside. ? Use a white noise machine to block noise. ? Keep the temperature cool.  Limit screen use before bedtime. This includes: ? Watching TV. ? Using your smartphone, tablet, or computer.  Stick to a routine that includes going to bed and waking up at the same times every day and night. This can help you fall asleep faster. Consider making a quiet activity, such as reading, part of your nighttime routine.  Try to avoid taking naps during the day so that you sleep better at night.  Get out of bed if you are still awake after 15 minutes of trying to sleep. Keep the lights down, but try reading or doing a quiet activity. When you feel sleepy, go back to bed. General instructions  Take over-the-counter and prescription medicines only as told by your health care provider.  Exercise regularly, as told by your health care provider. Avoid exercise starting several hours before bedtime.  Use relaxation techniques to manage stress. Ask your health care provider to suggest some techniques that may work well for you. These may include: ? Breathing exercises. ? Routines to release muscle tension. ? Visualizing peaceful scenes.  Make sure that you drive carefully. Avoid driving if you feel very sleepy.  Keep all follow-up visits as told by your health care provider. This is important. Contact a health care provider if:  You  are tired throughout the day.  You have trouble in your daily routine due to sleepiness.  You continue to have sleep problems, or your sleep problems get worse. Get help right away if:  You have serious thoughts about hurting yourself or someone else. If you ever feel like you may hurt yourself or others, or have thoughts about taking your own life, get help right away. You can go to your nearest emergency department or call:  Your local  emergency services (911 in the [place]  A suicide crisis helpline, such as the National Suicide Prevention Lifeline at 424-721-4079. This is open 24 hours a day. Summary  Insomnia is a sleep disorder that makes it difficult to fall asleep or stay asleep.  Insomnia can be long-term (chronic) or short-term (acute).  Treatment for insomnia depends on the cause. Treatment may focus on treating an underlying condition that is causing insomnia.  Keep a sleep diary to help you and your health care provider figure out what could be causing your insomnia. This information is not intended to replace advice given to you by your health care provider. Make sure you discuss any questions you have with your health care provider. Document Released: 02/07/2000 Document Revised: 01/22/2017 Document Reviewed: 11/19/2016 Elsevier Patient Education  [employer].    If you have lab work done today you will be contacted with your lab results within the next 2 weeks.  If you have not heard from [place] then please contact us. The fastest way to get your results is to register for My Chart.   IF you received an x-ray today, you will receive an invoice from [facility]. Please contact [facility] at [phone] with questions or concerns regarding your invoice.   IF you received labwork today, you will receive an invoice from [facility]. Please contact [facility] at [phone] with questions or concerns regarding your invoice.   Our billing staff will not be able to assist you with questions regarding bills from these companies.  You will be contacted with the lab results as soon as they are available. The fastest way to get your results is to activate your My Chart account. Instructions are located on the last page of this paperwork. If you have not heard from [place] regarding the results in 2 weeks, please contact this office.       Signed,   Meredith Staggers, MD Primary Care at [facility].  10/26/18 8:46 PM

## 2018-11-15 ENCOUNTER — Ambulatory Visit: Payer: BC Managed Care – PPO | Admitting: Family Medicine

## 2018-11-16 ENCOUNTER — Ambulatory Visit (INDEPENDENT_AMBULATORY_CARE_PROVIDER_SITE_OTHER): Payer: BC Managed Care – PPO | Admitting: Family Medicine

## 2018-11-16 ENCOUNTER — Other Ambulatory Visit: Payer: Self-pay

## 2018-11-16 ENCOUNTER — Encounter: Payer: Self-pay | Admitting: Family Medicine

## 2018-11-16 VITALS — BP 105/66 | HR 85 | Temp 99.2°F | Wt 136.0 lb

## 2018-11-16 DIAGNOSIS — F329 Major depressive disorder, single episode, unspecified: Secondary | ICD-10-CM

## 2018-11-16 DIAGNOSIS — Z1329 Encounter for screening for other suspected endocrine disorder: Secondary | ICD-10-CM | POA: Diagnosis not present

## 2018-11-16 DIAGNOSIS — M25611 Stiffness of right shoulder, not elsewhere classified: Secondary | ICD-10-CM | POA: Insufficient documentation

## 2018-11-16 DIAGNOSIS — F4323 Adjustment disorder with mixed anxiety and depressed mood: Secondary | ICD-10-CM | POA: Diagnosis not present

## 2018-11-16 MED ORDER — BUPROPION HCL ER (SR) 100 MG PO TB12: 100.0000 mg | ORAL_TABLET | Freq: Every day | ORAL | 0 refills | Status: DC

## 2018-11-16 NOTE — Progress Notes (Signed)
Subjective:    Patient ID: Jackie Brooks, female    DOB: [birth year], 33 y.o.   MRN: [id number]  HPI Jackie Brooks is a 33 y.o. female Presents today for: Chief Complaint  Patient presents with  . anxiety/depression    sleeping is better but mood is still about the same as last visit. GAD7=12 PHQ9=12. Been feeling weired not sure if this is related to the medication i am currently takimg   Follow-up from September 2 visit.  Systems and adjustment disorder with worsening symptoms, likely related to her persistent pain and debility from previous injury.  Found to have possible component of depression in addition to adjustment disorder and persistent insomnia.  Ultimately decided to try SSRI, low-dose Zoloft initially started 25 mg daily, with hydroxyzine at night for insomnia for Benadryl that she had taken previously.  Counseling also discussed with number provided, and other coping techniques including exercise as tolerated.  Ups and downs. Did feel better to take daughter to park.  Met with therapist 2 times - 1hr each.  More time to talk, but no specific changes in plan. Meeting again tomorrow. In person.  When pain hits, seems to revert her to more depression symptoms as feels like she can't do anything.  Having weird dreams about dog passed months ago.  Decreased libido- noticed prior to meds, but worse after staring Zoloft.  Weight gain has been concerning. Walking on treadmill, some cardio.  Benadryl worked better for sleep than hydroxyzine.   No history of seizures, has not tried wellbutrin in past.   No results found for: TSH   Wt Readings from Last 3 Encounters:  11/16/18 136 lb (61.7 kg)  10/26/18 131 lb 3.2 oz (59.5 kg)  06/01/16 130 lb (59 kg)   Depression screen [facility] 2/9 11/16/2018 10/26/2018  Decreased Interest 1 3  Down, Depressed, Hopeless 1 3  PHQ - 2 Score 2 6  Altered sleeping 2 3  Tired, decreased energy 1 3  Change in appetite 2 3  Feeling bad or failure  about yourself  2 3  Trouble concentrating 2 3  Moving slowly or fidgety/restless 1 3  Suicidal thoughts 0 0  PHQ-9 Score 12 24  Difficult doing work/chores - Somewhat difficult     GAD 7 : Generalized Anxiety Score 11/16/2018 10/26/2018  Nervous, Anxious, on Edge 2 3  Control/stop worrying 2 3  Worry too much - different things 2 3  Trouble relaxing 1 3  Restless 1 3  Easily annoyed or irritable 2 3  Afraid - awful might happen 2 3  Total GAD 7 Score 12 21  Anxiety Difficulty - Somewhat difficult        Patient Active Problem List   Diagnosis Date Noted  . Ileus, postoperative ([place]) 12/15/2012  . Edema 12/15/2012  . Anemia 12/14/2012  . Status post primary low transverse cesarean section 12/14/2012  . Post-dates pregnancy 12/09/2012  . Late prenatal care 09/02/2012  . Migraines 09/02/2012   Past Medical History:  Diagnosis Date  . Headache(784.0)   . Infection    UTI  . Medical history non-contributory    Past Surgical History:  Procedure Laterality Date  . CESAREAN SECTION N/A 12/11/2012   Procedure: CESAREAN SECTION;  Surgeon: Thurnell Lose, MD;  Location: [facility];  Service: Obstetrics;  Laterality: N/A;  . NO PAST SURGERIES    . SHOULDER SURGERY Right 09/28/2018   No Known Allergies Prior to Admission medications   Medication Sig Start Date  End Date Taking? Authorizing Provider  acetaminophen (TYLENOL 8 HOUR) 650 MG CR tablet Take 650 mg by mouth every 8 (eight) hours as needed for pain.   Yes [provider]  diphenhydrAMINE (BENADRYL) 25 mg capsule Take 25 mg by mouth every 6 (six) hours as needed.   Yes [provider]  hydrOXYzine (ATARAX/VISTARIL) 25 MG tablet Take 0.5-1 tablets (12.5-25 mg total) by mouth at bedtime as needed for anxiety. 10/26/18  Yes Wendie Agreste, MD  oxyCODONE-acetaminophen (PERCOCET/ROXICET) 5-325 MG tablet Take by mouth every 4 (four) hours as needed for severe pain.   Yes [provider]  sertraline  (ZOLOFT) 25 MG tablet Take 1 tablet (25 mg total) by mouth daily. 10/26/18  Yes Wendie Agreste, MD  famotidine (PEPCID) 20 MG tablet Take 1 tablet (20 mg total) by mouth 2 (two) times daily. Patient not taking: Reported on 09/26/2014 09/02/13 11/15/14  Noemi Chapel, MD   Social History   Socioeconomic History  . Marital status: Single    Spouse name: Not on file  . Number of children: Not on file  . Years of education: Not on file  . Highest education level: Not on file  Occupational History  . Not on file  Social Needs  . Financial resource strain: Not on file  . Food insecurity    Worry: Not on file    Inability: Not on file  . Transportation needs    Medical: Not on file    Non-medical: Not on file  Tobacco Use  . Smoking status: Never Smoker  . Smokeless tobacco: Never Used  Substance and Sexual Activity  . Alcohol use: No  . Drug use: No  . Sexual activity: Not Currently    Comment: 4 months agomonths ago  Lifestyle  . Physical activity    Days per week: Not on file    Minutes per session: Not on file  . Stress: Not on file  Relationships  . Social Herbalist on phone: Not on file    Gets together: Not on file    Attends religious service: Not on file    Active member of club or organization: Not on file    Attends meetings of clubs or organizations: Not on file    Relationship status: Not on file  . Intimate partner violence    Fear of current or ex partner: Not on file    Emotionally abused: Not on file    Physically abused: Not on file    Forced sexual activity: Not on file  Other Topics Concern  . Not on file  Social History Narrative   ** Merged History Encounter **        Review of Systems Per HPI.     Objective:   Physical Exam Constitutional:      General: She is not in acute distress.    Appearance: She is well-developed.  HENT:     Head: Normocephalic and atraumatic.  Neck:     Thyroid: No thyroid mass or thyromegaly.   Cardiovascular:     Rate and Rhythm: Normal rate.  Pulmonary:     Effort: Pulmonary effort is normal.  Neurological:     Mental Status: She is alert and oriented to person, place, and time.  Psychiatric:        Attention and Perception: Attention normal.        Mood and Affect: Mood and affect normal.        Speech:  Speech normal.        Behavior: Behavior normal. Behavior is cooperative.        Thought Content: Thought content normal. Thought content does not include homicidal or suicidal ideation.    Vitals:   11/16/18 1502  BP: 105/66  Pulse: 85  Temp: 99.2 F (37.3 C)  TempSrc: Oral  SpO2: 99%  Weight: 136 lb (61.7 kg)       Assessment & Plan:   POLLYANN ROA is a 33 y.o. female Reactive depression - Plan: TSH, buPROPion (WELLBUTRIN SR) 100 MG 12 hr tablet  Adjustment disorder with mixed anxiety and depressed mood - Plan: TSH, buPROPion (WELLBUTRIN SR) 100 MG 12 hr tablet  Screening for thyroid disorder - Plan: TSH  Overall she has improved with improvement in both depression screening score as well as anxiety .  Has had some more motivation and activity.  Unfortunately has had some increased sexual side effects with SSRI, and abnormal dreams.  Would like to try stopping medication.  -Check TSH with weight gain  -Continue counseling as that has been beneficial as well.  Continue exercise/activity as tolerated, and follow-up with orthopedist regarding pain medication options and milder option if needed for breakthrough pain in the middle of the night.  -Option of Wellbutrin, low dose of the 100 mg daily to begin with if persistent depression symptoms.  Can decide next week/after meeting with therapist on starting that medicine.  Recheck 2 weeks. Sooner if worse.   Meds ordered this encounter  Medications  . buPROPion (WELLBUTRIN SR) 100 MG 12 hr tablet    Sig: Take 1 tablet (100 mg total) by mouth daily.    Dispense:  30 tablet    Refill:  0   Patient Instructions     Stop hydroxyzine, ok to use benadryl at night.   Stop sertraline.  In next week, can start wellbutrin once per day and recheck in 2 weeks. Another option is to continue therapy alone if you are having side effects from the medication.   Consult your surgeon about the pain meds and options for breakthrough pain or milder pain med options.    If you have lab work done today you will be contacted with your lab results within the next 2 weeks.  If you have not heard from [place] then please contact us. The fastest way to get your results is to register for My Chart.   IF you received an x-ray today, you will receive an invoice from [facility]. Please contact [facility] at [phone] with questions or concerns regarding your invoice.   IF you received labwork today, you will receive an invoice from [place]. Please contact [facility] at [phone] with questions or concerns regarding your invoice.   Our billing staff will not be able to assist you with questions regarding bills from these companies.  You will be contacted with the lab results as soon as they are available. The fastest way to get your results is to activate your My Chart account. Instructions are located on the last page of this paperwork. If you have not heard from [place] regarding the results in 2 weeks, please contact this office.       Signed,   Merri Ray, MD [facility] [place].  11/16/18 4:07 PM

## 2018-11-16 NOTE — Patient Instructions (Addendum)
  Stop hydroxyzine, ok to use benadryl at night.   Stop sertraline.  In next week, can start wellbutrin once per day and recheck in 2 weeks. Another option is to continue therapy alone if you are having side effects from the medication.   Consult your surgeon about the pain meds and options for breakthrough pain or milder pain med options.    If you have lab work done today you will be contacted with your lab results within the next 2 weeks.  If you have not heard from [place] then please contact us. The fastest way to get your results is to register for My Chart.   IF you received an x-ray today, you will receive an invoice from [facility]. Please contact [facility] at [phone] with questions or concerns regarding your invoice.   IF you received labwork today, you will receive an invoice from [place]. Please contact [facility] at [phone] with questions or concerns regarding your invoice.   Our billing staff will not be able to assist you with questions regarding bills from these companies.  You will be contacted with the lab results as soon as they are available. The fastest way to get your results is to activate your My Chart account. Instructions are located on the last page of this paperwork. If you have not heard from [place] regarding the results in 2 weeks, please contact this office.

## 2018-11-17 LAB — TSH: TSH: 0.861 u[IU]/mL (ref 0.450–4.500)

## 2018-11-22 ENCOUNTER — Other Ambulatory Visit (HOSPITAL_COMMUNITY)
Admission: RE | Admit: 2018-11-22 | Discharge: 2018-11-22 | Disposition: A | Discharge status: Home / Self Care | Payer: BC Managed Care – PPO | Source: Ambulatory Visit | Attending: Registered Nurse | Admitting: Registered Nurse

## 2018-11-22 ENCOUNTER — Encounter: Payer: Self-pay | Admitting: Registered Nurse

## 2018-11-22 ENCOUNTER — Other Ambulatory Visit: Payer: Self-pay

## 2018-11-22 ENCOUNTER — Ambulatory Visit (INDEPENDENT_AMBULATORY_CARE_PROVIDER_SITE_OTHER): Payer: BC Managed Care – PPO | Admitting: Registered Nurse

## 2018-11-22 VITALS — BP 119/79 | HR 75 | Temp 98.7°F | Resp 16 | Wt 134.0 lb

## 2018-11-22 DIAGNOSIS — N898 Other specified noninflammatory disorders of vagina: Secondary | ICD-10-CM | POA: Diagnosis not present

## 2018-11-22 DIAGNOSIS — B9689 Other specified bacterial agents as the cause of diseases classified elsewhere: Secondary | ICD-10-CM | POA: Diagnosis not present

## 2018-11-22 DIAGNOSIS — N760 Acute vaginitis: Secondary | ICD-10-CM | POA: Diagnosis not present

## 2018-11-22 LAB — POCT WET + KOH PREP
Trich by wet prep: ABSENT
Yeast by KOH: ABSENT
Yeast by wet prep: ABSENT

## 2018-11-22 LAB — POCT URINALYSIS DIP (MANUAL ENTRY)
Bilirubin, UA: NEGATIVE
Blood, UA: NEGATIVE
Glucose, UA: NEGATIVE mg/dL
Ketones, POC UA: NEGATIVE mg/dL
Leukocytes, UA: NEGATIVE
Nitrite, UA: NEGATIVE
Protein Ur, POC: NEGATIVE mg/dL
Spec Grav, UA: 1.025 (ref 1.010–1.025)
Urobilinogen, UA: 0.2 E.U./dL
pH, UA: 7.5 (ref 5.0–8.0)

## 2018-11-22 MED ORDER — CLINDAMYCIN PHOSPHATE 100 MG VA SUPP: 100.0000 mg | Freq: Every day | VAGINAL | 0 refills | Status: DC

## 2018-11-22 NOTE — Patient Instructions (Signed)
° ° ° °  If you have lab work done today you will be contacted with your lab results within the next 2 weeks.  If you have not heard from us then please contact us. The fastest way to get your results is to register for My Chart. ° ° °IF you received an x-ray today, you will receive an invoice from [facility]. Please contact [facility] at [phone] with questions or concerns regarding your invoice.  ° °IF you received labwork today, you will receive an invoice from [facility]. Please contact [facility] at [phone] with questions or concerns regarding your invoice.  ° °Our billing staff will not be able to assist you with questions regarding bills from these companies. ° °You will be contacted with the lab results as soon as they are available. The fastest way to get your results is to activate your My Chart account. Instructions are located on the last page of this paperwork. If you have not heard from us regarding the results in 2 weeks, please contact this office. °  ° ° ° °

## 2018-11-22 NOTE — Progress Notes (Signed)
Established Patient Office Visit  Subjective:  Patient ID: Jackie Brooks, female    DOB: 28-May-1985  Age: 33 y.o. MRN: [id number]  CC:  Chief Complaint  Patient presents with  . Vaginal Odor    pt states she has noticed a odor x 2 weeks with some discharge     HPI Jackie Brooks presents for recurrent BV  States that she has been treated multiple times previously for BV with a number of rounds of metronidazole, including 3 months of suppositories. Unfortunately, it has again recurred. Her largest complaint is odor, which she notes whenever she exercises. She has had candidal infections in the past. She denies [facility]. She states that she is doing all nonpharm as instructed: cotton underwear, no soap, no douching, sleeping in loose fitting clothing, showering regularly, her partner is clean.  Has not seen specialist. Has not tried other treatments besides metronidazole.  Past Medical History:  Diagnosis Date  . Headache(784.0)   . Infection    UTI  . Medical history non-contributory     Past Surgical History:  Procedure Laterality Date  . CESAREAN SECTION N/A 12/11/2012   Procedure: CESAREAN SECTION;  Surgeon: Geryl RankinsEvelyn Varnado, MD;  Location: [facility];  Service: Obstetrics;  Laterality: N/A;  . NO PAST SURGERIES    . SHOULDER SURGERY Right 09/28/2018    Family History  Problem Relation Age of Onset  . Cancer Paternal Aunt        ? brain  . Cancer Paternal Grandmother        breast  . Hypertension Mother   . Diabetes Mother   . Diabetes Father     Social History   Socioeconomic History  . Marital status: Single    Spouse name: Not on file  . Number of children: Not on file  . Years of education: Not on file  . Highest education level: Not on file  Occupational History  . Not on file  Social Needs  . Financial resource strain: Not on file  . Food insecurity    Worry: Not on file    Inability: Not on file  . Transportation needs    Medical: Not on file   Non-medical: Not on file  Tobacco Use  . Smoking status: Never Smoker  . Smokeless tobacco: Never Used  Substance and Sexual Activity  . Alcohol use: No  . Drug use: No  . Sexual activity: Not Currently    Comment: 4 months agomonths ago  Lifestyle  . Physical activity    Days per week: Not on file    Minutes per session: Not on file  . Stress: Not on file  Relationships  . Social Musicianconnections    Talks on phone: Not on file    Gets together: Not on file    Attends religious service: Not on file    Active member of club or organization: Not on file    Attends meetings of clubs or organizations: Not on file    Relationship status: Not on file  . Intimate partner violence    Fear of current or ex partner: Not on file    Emotionally abused: Not on file    Physically abused: Not on file    Forced sexual activity: Not on file  Other Topics Concern  . Not on file  Social History Narrative   ** Merged History Encounter **        Outpatient Medications Prior to Visit  Medication Sig Dispense Refill  .  acetaminophen (TYLENOL 8 HOUR) 650 MG CR tablet Take 650 mg by mouth every 8 (eight) hours as needed for pain.    . diphenhydrAMINE (BENADRYL) 25 mg capsule Take 25 mg by mouth every 6 (six) hours as needed.    Marland Kitchen oxyCODONE-acetaminophen (PERCOCET/ROXICET) 5-325 MG tablet Take by mouth every 4 (four) hours as needed for severe pain.    Marland Kitchen buPROPion (WELLBUTRIN SR) 100 MG 12 hr tablet Take 1 tablet (100 mg total) by mouth daily. 30 tablet 0   No facility-administered medications prior to visit.     No Known Allergies  ROS Review of Systems  Constitutional: Negative.   HENT: Negative.   Eyes: Negative.   Respiratory: Negative.   Cardiovascular: Negative.   Gastrointestinal: Negative.   Endocrine: Negative.   Genitourinary: Positive for vaginal discharge. Negative for vaginal bleeding and vaginal pain.       Vaginal odor  Musculoskeletal: Negative.   Skin: Negative.    Allergic/Immunologic: Negative.   Neurological: Negative.   Hematological: Negative.   Psychiatric/Behavioral: Negative.   All other systems reviewed and are negative.     Objective:    Physical Exam Declines exam with female provider. Preferred to self swab.  BP 119/79   Pulse 75   Temp 98.7 F (37.1 C) (Oral)   Resp 16   Wt 134 lb (60.8 kg)   LMP  (LMP Unknown)   SpO2 98%   BMI 27.06 kg/m  Wt Readings from Last 3 Encounters:  11/22/18 134 lb (60.8 kg)  11/16/18 136 lb (61.7 kg)  10/26/18 131 lb 3.2 oz (59.5 kg)     There are no preventive care reminders to display for this patient.  There are no preventive care reminders to display for this patient.  Lab Results  Component Value Date   TSH 0.861 11/16/2018   Lab Results  Component Value Date   WBC 8.3 06/01/2016   HGB 12.1 06/01/2016   HCT 39.2 06/01/2016   MCV 77.5 (L) 06/01/2016   PLT 309 06/01/2016   Lab Results  Component Value Date   NA 137 06/01/2016   K 3.6 06/01/2016   CO2 25 06/01/2016   GLUCOSE 123 (H) 06/01/2016   BUN 17 06/01/2016   CREATININE 0.74 06/01/2016   BILITOT <0.2 (L) 09/02/2013   ALKPHOS 68 09/02/2013   AST 12 09/02/2013   ALT 8 09/02/2013   PROT 7.5 09/02/2013   ALBUMIN 3.9 09/02/2013   CALCIUM 9.5 06/01/2016   ANIONGAP 10 06/01/2016   No results found for: CHOL No results found for: HDL No results found for: LDLCALC No results found for: TRIG No results found for: CHOLHDL No results found for: SPQZ3A    Assessment & Plan:   Problem List Items Addressed This Visit    None    Visit Diagnoses    Vaginal discharge    -  Primary   Relevant Medications   clindamycin (CLEOCIN) 100 MG vaginal suppository   Other Relevant Orders   POCT Wet + KOH Prep (Completed)   POCT urinalysis dipstick (Completed)   Urine cytology ancillary only      Meds ordered this encounter  Medications  . clindamycin (CLEOCIN) 100 MG vaginal suppository    Sig: Place 1 suppository (100  mg total) vaginally at bedtime.    Dispense:  3 suppository    Refill:  0    Order Specific Question:   Supervising Provider    Answer:   Collie Siad A [id number]  Follow-up: No follow-ups on file.   PLAN  Wet prep shows BV  Given frequent use of metronidazole without success, will try clindamycin 100mg  vaginal suppository 3 times.  Given persistent recurrence of her symptoms, will refer to gyn for further workup and management.  Patient encouraged to call clinic with any questions, comments, or concerns.   Maximiano Coss, NP

## 2018-11-23 ENCOUNTER — Other Ambulatory Visit: Payer: Self-pay | Admitting: Registered Nurse

## 2018-11-23 DIAGNOSIS — N760 Acute vaginitis: Secondary | ICD-10-CM

## 2018-11-23 DIAGNOSIS — B9689 Other specified bacterial agents as the cause of diseases classified elsewhere: Secondary | ICD-10-CM

## 2018-11-23 MED ORDER — CLINDAMYCIN PHOSPHATE 2 % VA CREA
1.0000 | TOPICAL_CREAM | Freq: Every day | VAGINAL | 0 refills | Status: DC
Start: 2018-11-23 — End: 2018-11-28

## 2018-11-23 NOTE — Telephone Encounter (Signed)
Completed.

## 2018-11-24 LAB — URINE CYTOLOGY ANCILLARY ONLY
Chlamydia: NEGATIVE
Molecular Disclaimer: NEGATIVE
Molecular Disclaimer: NEGATIVE
Molecular Disclaimer: NORMAL
Neisseria Gonorrhea: NEGATIVE
Trichomonas: NEGATIVE

## 2018-11-28 ENCOUNTER — Other Ambulatory Visit: Payer: Self-pay

## 2018-11-28 ENCOUNTER — Ambulatory Visit (INDEPENDENT_AMBULATORY_CARE_PROVIDER_SITE_OTHER): Payer: BC Managed Care – PPO

## 2018-11-28 ENCOUNTER — Encounter: Payer: Self-pay | Admitting: Family Medicine

## 2018-11-28 ENCOUNTER — Ambulatory Visit (INDEPENDENT_AMBULATORY_CARE_PROVIDER_SITE_OTHER): Payer: BC Managed Care – PPO | Admitting: Family Medicine

## 2018-11-28 VITALS — BP 115/75 | HR 83 | Temp 97.7°F | Ht 59.0 in | Wt 135.8 lb

## 2018-11-28 DIAGNOSIS — M25511 Pain in right shoulder: Secondary | ICD-10-CM

## 2018-11-28 DIAGNOSIS — R55 Syncope and collapse: Secondary | ICD-10-CM

## 2018-11-28 DIAGNOSIS — R079 Chest pain, unspecified: Secondary | ICD-10-CM

## 2018-11-28 DIAGNOSIS — F4323 Adjustment disorder with mixed anxiety and depressed mood: Secondary | ICD-10-CM | POA: Diagnosis not present

## 2018-11-28 DIAGNOSIS — R0789 Other chest pain: Secondary | ICD-10-CM | POA: Diagnosis not present

## 2018-11-28 DIAGNOSIS — R072 Precordial pain: Secondary | ICD-10-CM | POA: Diagnosis not present

## 2018-11-28 DIAGNOSIS — S0990XA Unspecified injury of head, initial encounter: Secondary | ICD-10-CM

## 2018-11-28 DIAGNOSIS — S4991XA Unspecified injury of right shoulder and upper arm, initial encounter: Secondary | ICD-10-CM | POA: Diagnosis not present

## 2018-11-28 DIAGNOSIS — S299XXA Unspecified injury of thorax, initial encounter: Secondary | ICD-10-CM | POA: Diagnosis not present

## 2018-11-28 LAB — POCT CBC
Granulocyte percent: 64.4 %G (ref 37–80)
HCT, POC: 38.2 % (ref 29–41)
Hemoglobin: 12.5 g/dL (ref 11–14.6)
Lymph, poc: 1.8 (ref 0.6–3.4)
MCH, POC: 25.7 pg — AB (ref 27–31.2)
MCHC: 32.7 g/dL (ref 31.8–35.4)
MCV: 78.6 fL (ref 76–111)
MID (cbc): 0.1 (ref 0–0.9)
MPV: 7.9 fL (ref 0–99.8)
POC Granulocyte: 3.5 (ref 2–6.9)
POC LYMPH PERCENT: 33 %L (ref 10–50)
POC MID %: 2.6 %M (ref 0–12)
Platelet Count, POC: 222 10*3/uL (ref 142–424)
RBC: 4.86 M/uL (ref 4.04–5.48)
RDW, POC: 13.3 %
WBC: 5.4 10*3/uL (ref 4.6–10.2)

## 2018-11-28 LAB — GLUCOSE, POCT (MANUAL RESULT ENTRY): POC Glucose: 85 mg/dl (ref 70–99)

## 2018-11-28 NOTE — Progress Notes (Signed)
Subjective:    Patient ID: Jackie Brooks, female    DOB: 1985/09/02, 33 y.o.   MRN: [id number]  HPI Jackie Brooks is a 33 y.o. female Presents today for: Chief Complaint  Patient presents with   Loss of Consciousness    friday night.    Depression    PHQ9=7   Fall    friday night and has hurt shoulder right side    Initially seen September 2, suspected adjustment disorder with orthopedic issues, possible underlying anxiety/depression.  She has been under the care of Dr. Stann Mainland with [facility] for right shoulder issues including surgery August 5.  Hydroxyzine given for sleep, started on Zoloft 25 mg daily, and counseling.  Follow-up September 23 overall improvement in both depression and anxiety screening scores, some improved motivation and activity but with increased sexual side effects with SSRIs, abnormal dreams, she requested stopping medication.  Zoloft discontinued.  Continued counseling, exercises treatments.  Option of Wellbutrin discussed after meeting with therapist.  Since last visit:  Met once more with therapist. Was advised by therapist that she could no longer "help with the case" if proceeding with lawyer - would not release info to lawyer. Too costly with copays, so did not go back. Was under the impression that Worker's comp would cover the copays. (previous provider under Worker's Comp was [employer] [facility], prior to Ortho).   Did not start wellbutrin. Felt more depressed after info from therapist.  Feels like ups and downs with depression and anxiety - sometimes better, sometimes worse.  Music is helping, walking more. Spending time with dtr and looking forward to her birthday.  Does not want to start new med.   Today reports loss of consciousness 2 nights ago.  Woke up out of sleep 1-2 am with chest pain - mid chest, and R shoulder soreness. Felt like feeling trapped gas, got out of bed - walking to kitchen - felt dizzy, not self, shaky, in  kitchen, head down on counter, looked up to reach for med. LOC - woke up on kitchen floor.  Unwitnessed. unknown time.  No bleeding. More R shoulder pain, same pain, but more of it.  Sore in lower chest wall Saturday am to touch lower mid chest. Tightness in chest saturday some that day and then some in office today.  Sore on back of head. No bleeding from head/scalp. HA that morning - lasted until [facility] powder and food - few hours. No new HA in past 2 days. No n/v.   No new focal weakness. Pain in R shoulder off and on since fall, but similar to pain prior to fall.  No narcotic pain meds that night, no alcohol. No IDU.  Had just finished cleocin suppository.      Similar episode in 2011 - syncope - unknown reason. Felt better after eating that day.        Depression screen [facility] 2/9 11/28/2018 11/22/2018 11/16/2018 10/26/2018  Decreased Interest '1 3 1 3  '$ Down, Depressed, Hopeless '1 3 1 3  '$ PHQ - 2 Score '2 6 2 6  '$ Altered sleeping '1 1 2 3  '$ Tired, decreased energy '1 3 1 3  '$ Change in appetite 1 0 2 3  Feeling bad or failure about yourself  '1 1 2 3  '$ Trouble concentrating 1 0 2 3  Moving slowly or fidgety/restless 0 0 1 3  Suicidal thoughts 0 0 0 0  PHQ-9 Score '7 11 12 24  '$ Difficult doing work/chores  Very difficult - - Somewhat difficult   GAD 7 : Generalized Anxiety Score 11/16/2018 10/26/2018  Nervous, Anxious, on Edge 2 3  Control/stop worrying 2 3  Worry too much - different things 2 3  Trouble relaxing 1 3  Restless 1 3  Easily annoyed or irritable 2 3  Afraid - awful might happen 2 3  Total GAD 7 Score 12 21  Anxiety Difficulty - Somewhat difficult  Gad 7 improved from 21 on September 2 to 12 last visit      Patient Active Problem List   Diagnosis Date Noted   Bacterial vaginitis 11/22/2018   Ileus, postoperative ([place]) 12/15/2012   Edema 12/15/2012   Anemia 12/14/2012   Status post primary low transverse cesarean section 12/14/2012   Post-dates pregnancy 12/09/2012    Late prenatal care 09/02/2012   Migraines 09/02/2012   Past Medical History:  Diagnosis Date   Headache(784.0)    Infection    UTI   Medical history non-contributory    Past Surgical History:  Procedure Laterality Date   CESAREAN SECTION N/A 12/11/2012   Procedure: CESAREAN SECTION;  Surgeon: Thurnell Lose, MD;  Location: [facility];  Service: Obstetrics;  Laterality: N/A;   NO PAST SURGERIES     SHOULDER SURGERY Right 09/28/2018   No Known Allergies Prior to Admission medications   Medication Sig Start Date End Date Taking? Authorizing Provider  acetaminophen (TYLENOL 8 HOUR) 650 MG CR tablet Take 650 mg by mouth every 8 (eight) hours as needed for pain.   Yes [provider]  diphenhydrAMINE (BENADRYL) 25 mg capsule Take 25 mg by mouth every 6 (six) hours as needed.   Yes [provider]  oxyCODONE-acetaminophen (PERCOCET/ROXICET) 5-325 MG tablet Take by mouth every 4 (four) hours as needed for severe pain.   Yes [provider]  CLEOCIN 100 MG vaginal suppository PLACE 1 SUPPOSITORY VAGINALLY UTD QHS 11/23/18   [provider]  famotidine (PEPCID) 20 MG tablet Take 1 tablet (20 mg total) by mouth 2 (two) times daily. Patient not taking: Reported on 09/26/2014 09/02/13 11/15/14  Noemi Chapel, MD   Social History   Socioeconomic History   Marital status: Single    Spouse name: Not on file   Number of children: Not on file   Years of education: Not on file   Highest education level: Not on file  Occupational History   Not on file  Social Needs   Financial resource strain: Not on file   Food insecurity    Worry: Not on file    Inability: Not on file   Transportation needs    Medical: Not on file    Non-medical: Not on file  Tobacco Use   Smoking status: Never Smoker   Smokeless tobacco: Never Used  Substance and Sexual Activity   Alcohol use: No   Drug use: No   Sexual activity: Not Currently    Comment: 4 months  agomonths ago  Lifestyle   Physical activity    Days per week: Not on file    Minutes per session: Not on file   Stress: Not on file  Relationships   Social connections    Talks on phone: Not on file    Gets together: Not on file    Attends religious service: Not on file    Active member of club or organization: Not on file    Attends meetings of clubs or organizations: Not on file    Relationship status: Not on  file   Intimate partner violence    Fear of current or ex partner: Not on file    Emotionally abused: Not on file    Physically abused: Not on file    Forced sexual activity: Not on file  Other Topics Concern   Not on file  Social History Narrative   ** Merged History Encounter **        Review of Systems Per HPI    Objective:   Physical Exam Vitals signs reviewed. Exam conducted with a chaperone present.  Constitutional:      Appearance: She is well-developed.  HENT:     Head: Normocephalic and atraumatic.     Comments: Scalp nontender, no wounds, no soft tissue swelling.  Eyes:     Conjunctiva/sclera: Conjunctivae normal.     Pupils: Pupils are equal, round, and reactive to light.  Neck:     Vascular: No carotid bruit.  Cardiovascular:     Rate and Rhythm: Normal rate and regular rhythm.     Heart sounds: Normal heart sounds.  Pulmonary:     Effort: Pulmonary effort is normal.     Breath sounds: Normal breath sounds.  Chest:    Abdominal:     Palpations: Abdomen is soft. There is no pulsatile mass.     Tenderness: There is no abdominal tenderness.  Skin:    General: Skin is warm and dry.  Neurological:     Mental Status: She is alert and oriented to person, place, and time.  Psychiatric:        Behavior: Behavior normal.     EKG: Sinus rhythm, rate 67, no acute findings or apparent significant changes from previous.  In April 2018  Results for orders placed or performed in visit on 11/28/18  POCT CBC  Result Value Ref Range   WBC 5.4  4.6 - 10.2 K/uL   Lymph, poc 1.8 0.6 - 3.4   POC LYMPH PERCENT 33.0 10 - 50 %L   MID (cbc) 0.1 0 - 0.9   POC MID % 2.6 0 - 12 %M   POC Granulocyte 3.5 2 - 6.9   Granulocyte percent 64.4 37 - 80 %G   RBC 4.86 4.04 - 5.48 M/uL   Hemoglobin 12.5 11 - 14.6 g/dL   HCT, POC 38.2 29 - 41 %   MCV 78.6 76 - 111 fL   MCH, POC 25.7 (A) 27 - 31.2 pg   MCHC 32.7 31.8 - 35.4 g/dL   RDW, POC 13.3 %   Platelet Count, POC 222 142 - 424 K/uL   MPV 7.9 0 - 99.8 fL  POCT glucose (manual entry)  Result Value Ref Range   POC Glucose 85 70 - 99 mg/dl   Dg Chest 2 View  Result Date: 11/28/2018 CLINICAL DATA:  Syncopal episode with fall 2 nights ago. Right shoulder and sternal pain. EXAM: CHEST - 2 VIEW COMPARISON:  Radiographs 06/01/2016.  CT 06/01/2016. FINDINGS: The heart size and mediastinal contours are normal. The lungs are clear. There is no pleural effusion or pneumothorax. No acute osseous findings are identified. The sternum is not well visualized. No retrosternal hematoma. IMPRESSION: No evidence of active cardiopulmonary process or acute chest injury. Electronically Signed   By: Richardean Sale M.D.   On: 11/28/2018 17:45   Dg Shoulder Right  Result Date: 11/28/2018 CLINICAL DATA:  Syncopal episode with fall 2 nights ago. Right shoulder and sternal pain. EXAM: RIGHT SHOULDER - 2+ VIEW COMPARISON:  Radiographs 07/28/2018. FINDINGS:  AP and Y-views are submitted. The mineralization and alignment are normal. There is no evidence of acute fracture or dislocation. The subacromial space is preserved. No significant arthropathic changes. IMPRESSION: Stable radiographic appearance of the right shoulder. No acute osseous findings. Electronically Signed   By: Richardean Sale M.D.   On: 11/28/2018 17:46        Assessment & Plan:   Jackie Brooks is a 34 y.o. female Syncope and collapse - Plan: POCT CBC, POCT glucose (manual entry), Basic metabolic panel Chest pain, unspecified type - Plan: DG Chest 2 View,  Ambulatory referral to Cardiology Chest wall pain - Plan: DG Chest 2 View, EKG 12-Lead, Ambulatory referral to Cardiology Injury of head, initial encounter  -Differential includes vagal episode with pain versus component of volume depletion.  No apparent seizure activity.  Likely had head contusion but no worsening headache or other concerning symptoms at this point and nontender on exam.  -Maintenance of hydration discussed  - refer to cardiology given previous chest pain prior to collapse but no concerning findings on EKG in office and appears to be more chest wall pain  -ER/RTC precautions given  Pain in joint of right shoulder - Plan: DG Shoulder Right  -Some increased discomfort after waking up from fall but now reports symptoms similar to pain prior to fall.  Followed by orthopedics for previous injury.  No concerning findings on x-ray for acute trauma.  Recommended she follow-up with orthopedics to discuss shoulder pain further  Adjustment disorder with mixed anxiety and depressed mood  -Unfortunately counseling has been cost prohibitive for her recently.  Other phone numbers provided to see if they may be more cost effective.  Declined medications at this time.  Overall has had some improvement in adjustment disorder/anxiety depression symptoms.  Continue exercise as tolerated, other coping and stress management techniques.  -Additionally recommended she discuss with her workers comp provider to determine if some of her symptoms could be related to initial injury with adjustment disorder component.   No orders of the defined types were placed in this encounter.  Patient Instructions     For counseling - I would recommend discussing with your worker's comp treating provider to decide if your symptoms were related to your injury and if they would need to refer you. Otherwise here are some other local numbers to see if less costly. Let me know if I can help further.   [facility]  Phone: [phone] [facility]: Phone: [phone]  Contact orthopedic surgeon regarding right shoulder pain but I will let you know if there are any acute findings on the x-ray today.  Pain on chest appears to be chest wall pain but because of the passing out episode I will also refer you to cardiology.  See information below on syncope.  Make sure to drink plenty of fluids throughout the day.  I will let you know if there are any concerns on your blood test.  If any new lightheadedness, dizziness or you feel that you may pass out again, call 911 or go to emergency room.  Is your head is nontender today new headaches, I do not think CT scan or head imaging needed at this time.  See information below on head injuries.  Return to the clinic or go to the nearest emergency room if any of your symptoms worsen or new symptoms occur.   Head Injury, Adult There are many types of head injuries. Head injuries can be as minor as  a bump, or they can be a serious medical issue. More severe head injuries include:  A jarring injury to the brain (concussion).  A bruise (contusion) of the brain. This means there is bleeding in the brain that can cause swelling.  A cracked skull (skull fracture).  Bleeding in the brain that collects, clots, and forms a bump (hematoma). After a head injury, most problems occur within the first 24 hours, but side effects may occur up to 7-10 days after the injury. It is important to watch your condition for any changes. You may need to be observed in the emergency department or urgent care, or you may be admitted to the hospital. What are the causes? There are many possible causes of a head injury. A serious head injury may be caused by a car accident, bicycle or motorcycle accidents, sports injuries, and falls. What are the symptoms? Symptoms of a head injury include a contusion, bump, or bleeding at the site of the injury. Other physical symptoms may  include:  Headache.  Nausea or vomiting.  Dizziness.  Feeling tired.  Being uncomfortable around bright lights or loud noises.  Seizures.  Trouble being awakened.  Fainting. Mental or emotional symptoms may include:  Irritability.  Confusion and memory problems.  Poor attention and concentration.  Changes in eating or sleeping habits.  Anxiety or depression. How is this diagnosed? This condition can usually be diagnosed based on your symptoms, a description of the injury, and a physical exam. You may also have imaging tests done, such as a CT scan or MRI. How is this treated? Treatment for this condition depends on the severity and type of injury you have. The main goal of treatment is to prevent complications and to allow the brain time to heal. Mild head injury If you have a mild head injury, you may be sent home and treatment may include:  Observation. A responsible adult should stay with you for 24 hours after your injury and check on you often.  Physical rest.  Brain rest.  Pain medicines. Severe head injury If you have a severe head injury, treatment may include:  Close observation. This includes hospitalization with frequent physical exams.  Medicines to relieve pain, prevent seizures, and decrease brain swelling.  Breathing support. This may include using a ventilator.  Treatments to manage the swelling inside the brain.  Brain surgery. This may be needed to: ? Remove a blood clot. ? Stop the bleeding. ? Remove a part of the skull to allow room for the brain to swell. Follow these instructions at home: Activity  Rest and avoid activities that are physically hard or tiring.  Make sure you get enough sleep.  Limit activities that require a lot of thought or attention, such as: ? Watching TV. ? Playing memory games and puzzles. ? Job-related work or homework. ? Working on [employer], and texting.  Avoid activities that  could cause another head injury, such as playing sports, until your health care provider approves. Having another head injury, especially before the first one has healed, can be dangerous.  Ask your health care provider when it is safe for you to return to your regular activities, including work or school. Ask your health care provider for a step-by-step plan for gradually returning to activities.  Ask your health care provider when you can drive, ride a bicycle, or use heavy machinery. Your ability to react may be slower after a brain injury. Do not do these activities  if you are dizzy. Lifestyle   Do not drink alcohol until your health care provider approves. Do not use drugs. Alcohol and certain drugs may slow your recovery and can put you at risk of further injury.  If it is harder than usual to remember things, write them down.  If you are easily distracted, try to do one thing at a time.  Talk with family members or close friends when making important decisions.  Tell your friends, family, a trusted colleague, and work Freight forward[employer] about your injury, symptoms, and restrictions. Have them watch for any new or worsening problems. General instructions  Take over-the-counter and prescription medicines only as told by your health care provider.  Have someone stay with you for 24 hours after your head injury. This person should watch you for any changes in your symptoms and be ready to seek medical help.  Keep all follow-up visits as told by your health care provider. This is important. How is this prevented?  Work on improving your balance and strength to avoid falls.  Wear a seatbelt when you are in a moving vehicle.  Wear a helmet when riding a bicycle, skiing, or doing any other sport or activity that has a risk of injury.  If you drink alcohol: ? Limit how much you use to: ? 0-1 drink a day for women. ? 0-2 drinks a day for men. ? Be aware of how much alcohol is in your drink. In  the [place] one drink equals one 12 oz bottle of beer (355 mL), one 5 oz glass of wine (148 mL), or one 1 oz glass of hard liquor (44 mL).  Take safety measures in your home, such as: ? Removing clutter and tripping hazards from floors and stairways. ? Using grab bars in bathrooms and handrails by stairs. ? Placing non-slip mats on floors and in bathtubs. ? Improving lighting in dim areas. Get help right away if:  You have: ? A severe headache that is not helped by medicine. ? Trouble walking or weakness in your arms and legs. ? Clear or bloody fluid coming from your nose or ears. ? Changes in your vision. ? A seizure.  You lose your balance.  You vomit.  Your pupils change size.  Your speech is slurred.  Your dizziness gets worse.  You faint.  You are sleepier than normal and have trouble staying awake.  Your symptoms get worse. These symptoms may represent a serious problem that is an emergency. Do not wait to see if the symptoms will go away. Get medical help right away. Call your local emergency services (911 in the [place] Do not drive yourself to the hospital. Summary  Head injuries can be minor or they can be a serious medical issue requiring immediate attention.  Treatment for this condition depends on the severity and type of injury you have.  Ask your health care provider when it is safe for you to return to your regular activities, including work or school.  Head injury prevention includes wearing a seat belt in a motor vehicle, using a helmet on a bicycle, limiting alcohol use, and taking safety measures in your home. This information is not intended to replace advice given to you by your health care provider. Make sure you discuss any questions you have with your health care provider. Document Released: 02/09/2005 Document Revised: 03/09/2018 Document Reviewed: 03/04/2018 Elsevier Patient Education  2020 [place] [employer].   Chest Wall Pain Chest wall pain is  pain in  or around the bones and muscles of your chest. Sometimes, an injury causes this pain. Excessive coughing or overuse of arm and chest muscles may also cause chest wall pain. Sometimes, the cause may not be known. This pain may take several weeks or longer to get better. Follow these instructions at home: Managing pain, stiffness, and swelling   If directed, put ice on the painful area: ? Put ice in a plastic bag. ? Place a towel between your skin and the bag. ? Leave the ice on for 20 minutes, 2-3 times per day. Activity  Rest as told by your health care provider.  Avoid activities that cause pain. These include any activities that use your chest muscles or your abdominal and side muscles to lift heavy items. Ask your health care provider what activities are safe for you. General instructions   Take over-the-counter and prescription medicines only as told by your health care provider.  Do not use any products that contain nicotine or tobacco, such as cigarettes, e-cigarettes, and chewing tobacco. These can delay healing after injury. If you need help quitting, ask your health care provider.  Keep all follow-up visits as told by your health care provider. This is important. Contact a health care provider if:  You have a fever.  Your chest pain becomes worse.  You have new symptoms. Get help right away if:  You have nausea or vomiting.  You feel sweaty or light-headed.  You have a cough with mucus from your lungs (sputum) or you cough up blood.  You develop shortness of breath. These symptoms may represent a serious problem that is an emergency. Do not wait to see if the symptoms will go away. Get medical help right away. Call your local emergency services (911 in the [place] Do not drive yourself to the hospital. Summary  Chest wall pain is pain in or around the bones and muscles of your chest.  Depending on the cause, it may be treated with ice, rest, medicines, and  avoiding activities that cause pain.  Contact a health care provider if you have a fever, worsening chest pain, or new symptoms.  Get help right away if you feel light-headed or you develop shortness of breath. These symptoms may be an emergency. This information is not intended to replace advice given to you by your health care provider. Make sure you discuss any questions you have with your health care provider. Document Released: 02/09/2005 Document Revised: 08/12/2017 Document Reviewed: 08/12/2017 Elsevier Patient Education  2020 [employer].   Syncope  Syncope refers to a condition in which a person temporarily loses consciousness. Syncope may also be called fainting or passing out. It is caused by a sudden decrease in blood flow to the brain. Even though most causes of syncope are not dangerous, syncope can be a sign of a serious medical problem. Your health care provider may do tests to find the reason why you are having syncope. Signs that you may be about to faint include:  Feeling dizzy or light-headed.  Feeling nauseous.  Seeing all white or all black in your field of vision.  Having cold, clammy skin. If you faint, get medical help right away. Call your local emergency services (911 in the [place] Do not drive yourself to the hospital. Follow these instructions at home: Pay attention to any changes in your symptoms. Take these actions to stay safe and to help relieve your symptoms: Lifestyle  Do not drive, use machinery, or play sports until  your health care provider says it is okay.  Do not drink alcohol.  Do not use any products that contain nicotine or tobacco, such as cigarettes and e-cigarettes. If you need help quitting, ask your health care provider.  Drink enough fluid to keep your urine pale yellow. General instructions  Take over-the-counter and prescription medicines only as told by your health care provider.  If you are taking blood pressure or heart  medicine, get up slowly and take several minutes to sit and then stand. This can reduce dizziness or light-headedness.  Have someone stay with you until you feel stable.  If you start to feel like you might faint, lie down right away and raise (elevate) your feet above the level of your heart. Breathe deeply and steadily. Wait until all the symptoms have passed.  Keep all follow-up visits as told by your health care provider. This is important. Get help right away if you:  Have a severe headache.  Faint once or repeatedly.  Have pain in your chest, abdomen, or back.  Have a very fast or irregular heartbeat (palpitations).  Have pain when you breathe.  Are bleeding from your mouth or rectum, or you have black or tarry stool.  Have a seizure.  Are confused.  Have trouble walking.  Have severe weakness.  Have vision problems. These symptoms may represent a serious problem that is an emergency. Do not wait to see if your symptoms will go away. Get medical help right away. Call your local emergency services (911 in the [place] Do not drive yourself to the hospital. Summary  Syncope refers to a condition in which a person temporarily loses consciousness. It is caused by a sudden decrease in blood flow to the brain.  Signs that you may be about to faint include dizziness, feeling light-headed, feeling nauseous, sudden vision changes, or cold, clammy skin.  Although most causes of syncope are not dangerous, syncope can be a sign of a serious medical problem. If you faint, get medical help right away. This information is not intended to replace advice given to you by your health care provider. Make sure you discuss any questions you have with your health care provider. Document Released: 02/09/2005 Document Revised: 01/22/2017 Document Reviewed: 01/18/2017 Elsevier Patient Education  [employer].        If you have lab work done today you will be contacted with your lab  results within the next 2 weeks.  If you have not heard from [place] then please contact us. The fastest way to get your results is to register for My Chart.   IF you received an x-ray today, you will receive an invoice from [facility]. Please contact [facility] at [phone] with questions or concerns regarding your invoice.   IF you received labwork today, you will receive an invoice from [place]. Please contact [facility] at [phone] with questions or concerns regarding your invoice.   Our billing staff will not be able to assist you with questions regarding bills from these companies.  You will be contacted with the lab results as soon as they are available. The fastest way to get your results is to activate your My Chart account. Instructions are located on the last page of this paperwork. If you have not heard from [place] regarding the results in 2 weeks, please contact this office.       Signed,   Merri Ray, MD [facility].  11/29/18 11:28 AM

## 2018-11-28 NOTE — Patient Instructions (Addendum)
For counseling - I would recommend discussing with your worker's comp treating provider to decide if your symptoms were related to your injury and if they would need to refer you. Otherwise here are some other local numbers to see if less costly. Let me know if I can help further.   [facility] Phone: [phone] [facility]: Phone: [phone]  Contact orthopedic surgeon regarding right shoulder pain but I will let you know if there are any acute findings on the x-ray today.  Pain on chest appears to be chest wall pain but because of the passing out episode I will also refer you to cardiology.  See information below on syncope.  Make sure to drink plenty of fluids throughout the day.  I will let you know if there are any concerns on your blood test.  If any new lightheadedness, dizziness or you feel that you may pass out again, call 911 or go to emergency room.  Is your head is nontender today new headaches, I do not think CT scan or head imaging needed at this time.  See information below on head injuries.  Return to the clinic or go to the nearest emergency room if any of your symptoms worsen or new symptoms occur.   Head Injury, Adult There are many types of head injuries. Head injuries can be as minor as a bump, or they can be a serious medical issue. More severe head injuries include:  A jarring injury to the brain (concussion).  A bruise (contusion) of the brain. This means there is bleeding in the brain that can cause swelling.  A cracked skull (skull fracture).  Bleeding in the brain that collects, clots, and forms a bump (hematoma). After a head injury, most problems occur within the first 24 hours, but side effects may occur up to 7-10 days after the injury. It is important to watch your condition for any changes. You may need to be observed in the emergency department or urgent care, or you may be admitted to the hospital. What are the causes? There  are many possible causes of a head injury. A serious head injury may be caused by a car accident, bicycle or motorcycle accidents, sports injuries, and falls. What are the symptoms? Symptoms of a head injury include a contusion, bump, or bleeding at the site of the injury. Other physical symptoms may include:  Headache.  Nausea or vomiting.  Dizziness.  Feeling tired.  Being uncomfortable around bright lights or loud noises.  Seizures.  Trouble being awakened.  Fainting. Mental or emotional symptoms may include:  Irritability.  Confusion and memory problems.  Poor attention and concentration.  Changes in eating or sleeping habits.  Anxiety or depression. How is this diagnosed? This condition can usually be diagnosed based on your symptoms, a description of the injury, and a physical exam. You may also have imaging tests done, such as a CT scan or MRI. How is this treated? Treatment for this condition depends on the severity and type of injury you have. The main goal of treatment is to prevent complications and to allow the brain time to heal. Mild head injury If you have a mild head injury, you may be sent home and treatment may include:  Observation. A responsible adult should stay with you for 24 hours after your injury and check on you often.  Physical rest.  Brain rest.  Pain medicines. Severe head injury If you have a severe head injury, treatment may  include:  Close observation. This includes hospitalization with frequent physical exams.  Medicines to relieve pain, prevent seizures, and decrease brain swelling.  Breathing support. This may include using a ventilator.  Treatments to manage the swelling inside the brain.  Brain surgery. This may be needed to: ? Remove a blood clot. ? Stop the bleeding. ? Remove a part of the skull to allow room for the brain to swell. Follow these instructions at home: Activity  Rest and avoid activities that are  physically hard or tiring.  Make sure you get enough sleep.  Limit activities that require a lot of thought or attention, such as: ? Watching TV. ? Playing memory games and puzzles. ? Job-related work or homework. ? Working on [employer], and texting.  Avoid activities that could cause another head injury, such as playing sports, until your health care provider approves. Having another head injury, especially before the first one has healed, can be dangerous.  Ask your health care provider when it is safe for you to return to your regular activities, including work or school. Ask your health care provider for a step-by-step plan for gradually returning to activities.  Ask your health care provider when you can drive, ride a bicycle, or use heavy machinery. Your ability to react may be slower after a brain injury. Do not do these activities if you are dizzy. Lifestyle   Do not drink alcohol until your health care provider approves. Do not use drugs. Alcohol and certain drugs may slow your recovery and can put you at risk of further injury.  If it is harder than usual to remember things, write them down.  If you are easily distracted, try to do one thing at a time.  Talk with family members or close friends when making important decisions.  Tell your friends, family, a trusted colleague, and work Production designer, theatre/television/film about your injury, symptoms, and restrictions. Have them watch for any new or worsening problems. General instructions  Take over-the-counter and prescription medicines only as told by your health care provider.  Have someone stay with you for 24 hours after your head injury. This person should watch you for any changes in your symptoms and be ready to seek medical help.  Keep all follow-up visits as told by your health care provider. This is important. How is this prevented?  Work on improving your balance and strength to avoid falls.  Wear a seatbelt when  you are in a moving vehicle.  Wear a helmet when riding a bicycle, skiing, or doing any other sport or activity that has a risk of injury.  If you drink alcohol: ? Limit how much you use to: ? 0-1 drink a day for women. ? 0-2 drinks a day for men. ? Be aware of how much alcohol is in your drink. In the [place] one drink equals one 12 oz bottle of beer (355 mL), one 5 oz glass of wine (148 mL), or one 1 oz glass of hard liquor (44 mL).  Take safety measures in your home, such as: ? Removing clutter and tripping hazards from floors and stairways. ? Using grab bars in bathrooms and handrails by stairs. ? Placing non-slip mats on floors and in bathtubs. ? Improving lighting in dim areas. Get help right away if:  You have: ? A severe headache that is not helped by medicine. ? Trouble walking or weakness in your arms and legs. ? Clear or bloody fluid coming from your nose  or ears. ? Changes in your vision. ? A seizure.  You lose your balance.  You vomit.  Your pupils change size.  Your speech is slurred.  Your dizziness gets worse.  You faint.  You are sleepier than normal and have trouble staying awake.  Your symptoms get worse. These symptoms may represent a serious problem that is an emergency. Do not wait to see if the symptoms will go away. Get medical help right away. Call your local emergency services (911 in the [place] Do not drive yourself to the hospital. Summary  Head injuries can be minor or they can be a serious medical issue requiring immediate attention.  Treatment for this condition depends on the severity and type of injury you have.  Ask your health care provider when it is safe for you to return to your regular activities, including work or school.  Head injury prevention includes wearing a seat belt in a motor vehicle, using a helmet on a bicycle, limiting alcohol use, and taking safety measures in your home. This information is not intended to replace  advice given to you by your health care provider. Make sure you discuss any questions you have with your health care provider. Document Released: 02/09/2005 Document Revised: 03/09/2018 Document Reviewed: 03/04/2018 Elsevier Patient Education  2020 [employer].   Chest Wall Pain Chest wall pain is pain in or around the bones and muscles of your chest. Sometimes, an injury causes this pain. Excessive coughing or overuse of arm and chest muscles may also cause chest wall pain. Sometimes, the cause may not be known. This pain may take several weeks or longer to get better. Follow these instructions at home: Managing pain, stiffness, and swelling   If directed, put ice on the painful area: ? Put ice in a plastic bag. ? Place a towel between your skin and the bag. ? Leave the ice on for 20 minutes, 2-3 times per day. Activity  Rest as told by your health care provider.  Avoid activities that cause pain. These include any activities that use your chest muscles or your abdominal and side muscles to lift heavy items. Ask your health care provider what activities are safe for you. General instructions   Take over-the-counter and prescription medicines only as told by your health care provider.  Do not use any products that contain nicotine or tobacco, such as cigarettes, e-cigarettes, and chewing tobacco. These can delay healing after injury. If you need help quitting, ask your health care provider.  Keep all follow-up visits as told by your health care provider. This is important. Contact a health care provider if:  You have a fever.  Your chest pain becomes worse.  You have new symptoms. Get help right away if:  You have nausea or vomiting.  You feel sweaty or light-headed.  You have a cough with mucus from your lungs (sputum) or you cough up blood.  You develop shortness of breath. These symptoms may represent a serious problem that is an emergency. Do not wait to see if the  symptoms will go away. Get medical help right away. Call your local emergency services (911 in the [place] Do not drive yourself to the hospital. Summary  Chest wall pain is pain in or around the bones and muscles of your chest.  Depending on the cause, it may be treated with ice, rest, medicines, and avoiding activities that cause pain.  Contact a health care provider if you have a fever, worsening chest pain,  or new symptoms.  Get help right away if you feel light-headed or you develop shortness of breath. These symptoms may be an emergency. This information is not intended to replace advice given to you by your health care provider. Make sure you discuss any questions you have with your health care provider. Document Released: 02/09/2005 Document Revised: 08/12/2017 Document Reviewed: 08/12/2017 Elsevier Patient Education  2020 [employer].   Syncope  Syncope refers to a condition in which a person temporarily loses consciousness. Syncope may also be called fainting or passing out. It is caused by a sudden decrease in blood flow to the brain. Even though most causes of syncope are not dangerous, syncope can be a sign of a serious medical problem. Your health care provider may do tests to find the reason why you are having syncope. Signs that you may be about to faint include:  Feeling dizzy or light-headed.  Feeling nauseous.  Seeing all white or all black in your field of vision.  Having cold, clammy skin. If you faint, get medical help right away. Call your local emergency services (911 in the [place] Do not drive yourself to the hospital. Follow these instructions at home: Pay attention to any changes in your symptoms. Take these actions to stay safe and to help relieve your symptoms: Lifestyle  Do not drive, use machinery, or play sports until your health care provider says it is okay.  Do not drink alcohol.  Do not use any products that contain nicotine or tobacco, such as  cigarettes and e-cigarettes. If you need help quitting, ask your health care provider.  Drink enough fluid to keep your urine pale yellow. General instructions  Take over-the-counter and prescription medicines only as told by your health care provider.  If you are taking blood pressure or heart medicine, get up slowly and take several minutes to sit and then stand. This can reduce dizziness or light-headedness.  Have someone stay with you until you feel stable.  If you start to feel like you might faint, lie down right away and raise (elevate) your feet above the level of your heart. Breathe deeply and steadily. Wait until all the symptoms have passed.  Keep all follow-up visits as told by your health care provider. This is important. Get help right away if you:  Have a severe headache.  Faint once or repeatedly.  Have pain in your chest, abdomen, or back.  Have a very fast or irregular heartbeat (palpitations).  Have pain when you breathe.  Are bleeding from your mouth or rectum, or you have black or tarry stool.  Have a seizure.  Are confused.  Have trouble walking.  Have severe weakness.  Have vision problems. These symptoms may represent a serious problem that is an emergency. Do not wait to see if your symptoms will go away. Get medical help right away. Call your local emergency services (911 in the [place] Do not drive yourself to the hospital. Summary  Syncope refers to a condition in which a person temporarily loses consciousness. It is caused by a sudden decrease in blood flow to the brain.  Signs that you may be about to faint include dizziness, feeling light-headed, feeling nauseous, sudden vision changes, or cold, clammy skin.  Although most causes of syncope are not dangerous, syncope can be a sign of a serious medical problem. If you faint, get medical help right away. This information is not intended to replace advice given to you by your health care  provider. Make sure you discuss any questions you have with your health care provider. Document Released: 02/09/2005 Document Revised: 01/22/2017 Document Reviewed: 01/18/2017 Elsevier Patient Education  [employer].        If you have lab work done today you will be contacted with your lab results within the next 2 weeks.  If you have not heard from [place] then please contact us. The fastest way to get your results is to register for My Chart.   IF you received an x-ray today, you will receive an invoice from [facility]. Please contact [facility] at [phone] with questions or concerns regarding your invoice.   IF you received labwork today, you will receive an invoice from [place]. Please contact [facility] at [phone] with questions or concerns regarding your invoice.   Our billing staff will not be able to assist you with questions regarding bills from these companies.  You will be contacted with the lab results as soon as they are available. The fastest way to get your results is to activate your My Chart account. Instructions are located on the last page of this paperwork. If you have not heard from [place] regarding the results in 2 weeks, please contact this office.

## 2018-11-29 ENCOUNTER — Encounter: Payer: Self-pay | Admitting: Registered Nurse

## 2018-11-29 ENCOUNTER — Encounter: Payer: Self-pay | Admitting: Family Medicine

## 2018-11-29 ENCOUNTER — Other Ambulatory Visit: Payer: Self-pay | Admitting: Registered Nurse

## 2018-11-29 DIAGNOSIS — B9689 Other specified bacterial agents as the cause of diseases classified elsewhere: Secondary | ICD-10-CM

## 2018-11-29 DIAGNOSIS — N760 Acute vaginitis: Secondary | ICD-10-CM

## 2018-11-29 LAB — BASIC METABOLIC PANEL
BUN/Creatinine Ratio: 13 (ref 9–23)
BUN: 11 mg/dL (ref 6–20)
CO2: 23 mmol/L (ref 20–29)
Calcium: 9.5 mg/dL (ref 8.7–10.2)
Chloride: 104 mmol/L (ref 96–106)
Creatinine, Ser: 0.85 mg/dL (ref 0.57–1.00)
GFR calc Af Amer: 103 mL/min/{1.73_m2} (ref >59)
GFR calc non Af Amer: 90 mL/min/{1.73_m2} (ref >59)
Glucose: 86 mg/dL (ref 65–99)
Potassium: 4.7 mmol/L (ref 3.5–5.2)
Sodium: 141 mmol/L (ref 134–144)

## 2018-11-29 MED ORDER — SECNIDAZOLE 2 G PO PACK: 2.0000 g | PACK | Freq: Once | ORAL | 0 refills | Status: AC

## 2018-11-29 MED ORDER — METRONIDAZOLE 500 MG PO TABS: 500.0000 mg | ORAL_TABLET | Freq: Two times a day (BID) | ORAL | 0 refills | Status: DC

## 2018-11-29 NOTE — Progress Notes (Signed)
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  She has been referred to [facility] but unable to get in until end of month  Kathrin Ruddy, NP

## 2018-11-30 ENCOUNTER — Other Ambulatory Visit: Payer: Self-pay

## 2018-11-30 ENCOUNTER — Encounter: Payer: Self-pay | Admitting: Cardiology

## 2018-11-30 ENCOUNTER — Ambulatory Visit (INDEPENDENT_AMBULATORY_CARE_PROVIDER_SITE_OTHER): Payer: BC Managed Care – PPO | Admitting: Cardiology

## 2018-11-30 VITALS — BP 118/72 | HR 75 | Ht 59.0 in | Wt 137.0 lb

## 2018-11-30 DIAGNOSIS — R55 Syncope and collapse: Secondary | ICD-10-CM

## 2018-11-30 DIAGNOSIS — R079 Chest pain, unspecified: Secondary | ICD-10-CM

## 2018-11-30 DIAGNOSIS — Z7189 Other specified counseling: Secondary | ICD-10-CM | POA: Diagnosis not present

## 2018-11-30 NOTE — Progress Notes (Signed)
Cardiology Office Note:    Date:  11/30/2018   ID:  Jackie Brooks, DOB 07-23-1985, MRN [id number]  PCP:  Shade Flood, MD  Cardiologist:  Jodelle Red, MD  Referring MD: Shade Flood, MD   CC: new patient consult for syncope  History of Present Illness:    Jackie Brooks is a 33 y.o. female without significant PMH who is seen as a new consult at the request of Shade Flood, MD for the evaluation and management of syncope.  Notes reviewed. Per Dr. Paralee Cancel note from 11/28/18: "Today reports loss of consciousness 2 nights ago.  Woke up out of sleep 1-2 am with chest pain - mid chest, and R shoulder soreness. Felt like feeling trapped gas, got out of bed - walking to kitchen - felt dizzy, not self, shaky, in kitchen, head down on counter, looked up to reach for med. LOC - woke up on kitchen floor.  Unwitnessed. unknown time.  No bleeding. More R shoulder pain, same pain, but more of it.  Sore in lower chest wall Saturday am to touch lower mid chest. Tightness in chest saturday some that day and then some in office today.  Sore on back of head. No bleeding from head/scalp. HA that morning - lasted until [facility] powder and food - few hours. No new HA in past 2 days. No n/v. "  Syncope: Most recent episode: Was asleep, around 1-2 AM on Saturday morning. Felt chest pain, like prior "trapped gas" pain she had a month prior. Got out of bed, walked to kitchen to find the gas pills. Felt weak, lightheaded, like her hands were shaking. Put her head on the counter because she was feeling dizzy, then lifted her head. Next think she know she woke up on the floor. Had a bump on the back of her head. Knew where she was but couldn't remember how she got there. Did note that her chest was tender to touch at that time. Heart felt tight, not racing, not hard.   Frequency: Doesn't remember but was told around 2010-2011 she passed out while leaning on the counter talking to her significant  other. Woke up quickly, felt better after eating something. Duration of episodes: unclear.  Aggravating/alleviating factors: no clear Pre-existing medical conditions: none Potential medication/supplement interactions: oxycodone and benadryl. Takes benadryl every night, and it's the only way she can sleep. Takes oxycodone 4-5 times/week depending on her shoulder pain Prior workup: none ECG: NSR Family history: mother has enlarged heart, wearing heart monitor. No other that she knows of.  Notes resting chest pain, sharp, worse with deep breathing. Not tender to palpation now but has been in the past. First noted about a month ago, has been frequent since then. No shortness of breath. No fevers/chills, no cough.  Denies shortness of breath at rest or with normal exertion. No PND, orthopnea, LE edema or unexpected weight gain. No palpitations.   Past Medical History:  Diagnosis Date  . Headache(784.0)   . Infection    UTI  . Medical history non-contributory     Past Surgical History:  Procedure Laterality Date  . CESAREAN SECTION N/A 12/11/2012   Procedure: CESAREAN SECTION;  Surgeon: Geryl Rankins, MD;  Location: [facility];  Service: Obstetrics;  Laterality: N/A;  . NO PAST SURGERIES    . SHOULDER SURGERY Right 09/28/2018    Current Medications: Current Outpatient Medications on File Prior to Visit  Medication Sig  . acetaminophen (TYLENOL 8 HOUR) 650 MG  CR tablet Take 650 mg by mouth every 8 (eight) hours as needed for pain.  Marland Kitchen. CLEOCIN 100 MG vaginal suppository PLACE 1 SUPPOSITORY VAGINALLY UTD QHS  . diphenhydrAMINE (BENADRYL) 25 mg capsule Take 25 mg by mouth every 6 (six) hours as needed.  Marland Kitchen. oxyCODONE-acetaminophen (PERCOCET/ROXICET) 5-325 MG tablet Take by mouth every 4 (four) hours as needed for severe pain.  . [DISCONTINUED] famotidine (PEPCID) 20 MG tablet Take 1 tablet (20 mg total) by mouth 2 (two) times daily. (Patient not taking: Reported on 09/26/2014)   No current  facility-administered medications on file prior to visit.      Allergies:   Patient has no known allergies.   Social History   Tobacco Use  . Smoking status: Never Smoker  . Smokeless tobacco: Never Used  Substance Use Topics  . Alcohol use: No  . Drug use: No    Family History: family history includes Cancer in her paternal aunt and paternal grandmother; Diabetes in her father and mother; Hypertension in her mother.  ROS:   Please see the history of present illness.  Additional pertinent ROS: Constitutional: Negative for chills, fever, night sweats, unintentional weight loss  HENT: Negative for ear pain and hearing loss.   Eyes: Negative for loss of vision and eye pain.  Respiratory: Negative for cough, sputum, wheezing.   Cardiovascular: See HPI. Gastrointestinal: Negative for abdominal pain, melena, and hematochezia.  Genitourinary: Negative for dysuria and hematuria.  Musculoskeletal: Negative for falls and myalgias.  Skin: Negative for itching and rash.  Neurological: Negative for focal weakness, focal sensory changes and loss of consciousness.  Endo/Heme/Allergies: Does not bruise/bleed easily.     EKGs/Labs/Other Studies Reviewed:    The following studies were reviewed today: No prior cardiac studies  EKG:  EKG is personally reviewed.  The ekg ordered today demonstrates NSR  Recent Labs: 11/16/2018: TSH 0.861 11/28/2018: BUN 11; Creatinine, Ser 0.85; Hemoglobin 12.5; Potassium 4.7; Sodium 141  Recent Lipid Panel No results found for: CHOL, TRIG, HDL, CHOLHDL, VLDL, LDLCALC, LDLDIRECT  Physical Exam:    VS:  BP 118/72   Pulse 75   Ht 4\' 11"  (1.499 m)   Wt 137 lb (62.1 kg)   LMP  (LMP Unknown)   SpO2 98%   BMI 27.67 kg/m     Orthostatic VS for the past 24 hrs (Last 3 readings):  BP- Lying Pulse- Lying BP- Sitting Pulse- Sitting BP- Standing at 0 minutes Pulse- Standing at 0 minutes BP- Standing at 3 minutes Pulse- Standing at 3 minutes  11/30/18 1434  105/63 74 105/62 83 106/71 95 111/74 84    Wt Readings from Last 3 Encounters:  11/30/18 137 lb (62.1 kg)  11/28/18 135 lb 12.8 oz (61.6 kg)  11/22/18 134 lb (60.8 kg)    GEN: Well nourished, well developed in no acute distress HEENT: Normal, moist mucous membranes NECK: No JVD CARDIAC: regular rhythm, normal S1 and S2, no murmurs, rubs, gallops.  VASCULAR: Radial and DP pulses 2+ bilaterally. No carotid bruits RESPIRATORY:  Clear to auscultation without rales, wheezing or rhonchi  ABDOMEN: Soft, non-tender, non-distended MUSCULOSKELETAL:  Ambulates independently SKIN: Warm and dry, no edema NEUROLOGIC:  Alert and oriented x 3. No focal neuro deficits noted. PSYCHIATRIC:  Normal affect    ASSESSMENT:    1. Syncope, unspecified syncope type   2. Chest pain, unspecified type   3. Cardiac risk counseling   4. Counseling on health promotion and disease prevention    PLAN:  Syncope, chest pain: ECG normal. Has atypical cardiac chest pain, more likely MSK but cannot rule out pericarditis. Improving pain, no further syncope -will order echocardiogram -no clear palpitations, but if recurs would consider monitor  Cardiac risk counseling and prevention recommendations: -recommend heart healthy/Mediterranean diet, with whole grains, fruits, vegetable, fish, lean meats, nuts, and olive oil. Limit salt. -recommend moderate walking, 3-5 times/week for 30-50 minutes each session. Aim for at least 150 minutes.week. Goal should be pace of 3 miles/hours, or walking 1.5 miles in 30 minutes -recommend avoidance of tobacco products. Avoid excess alcohol.  Plan for follow up: if testing normal, as needed  Medication Adjustments/Labs and Tests Ordered: Current medicines are reviewed at length with the patient today.  Concerns regarding medicines are outlined above.  Orders Placed This Encounter  Procedures  . EKG 12-Lead  . ECHOCARDIOGRAM COMPLETE   No orders of the defined types were  placed in this encounter.   Patient Instructions  Medication Instructions:  Your Physician recommend you continue on your current medication as directed.    If you need a refill on your cardiac medications before your next appointment, please call your pharmacy.   Lab work: None  Testing/Procedures: Your physician has requested that you have an echocardiogram. Echocardiography is a painless test that uses sound waves to create images of your heart. It provides your doctor with information about the size and shape of your heart and how well your heart's chambers and valves are working. This procedure takes approximately one hour. There are no restrictions for this procedure. [place] [address]   Follow-Up: Your physician recommends that you schedule a follow-up appointment as needed with Dr. Harrell Gave.      Signed, Buford Dresser, MD PhD 11/30/2018  [facility]

## 2018-11-30 NOTE — Patient Instructions (Signed)
Medication Instructions:  Your Physician recommend you continue on your current medication as directed.    If you need a refill on your cardiac medications before your next appointment, please call your pharmacy.   Lab work: None  Testing/Procedures: Your physician has requested that you have an echocardiogram. Echocardiography is a painless test that uses sound waves to create images of your heart. It provides your doctor with information about the size and shape of your heart and how well your heart's chambers and valves are working. This procedure takes approximately one hour. There are no restrictions for this procedure. [place] [address]   Follow-Up: Your physician recommends that you schedule a follow-up appointment as needed with Dr. Harrell Gave.

## 2018-12-02 ENCOUNTER — Ambulatory Visit: Payer: BC Managed Care – PPO | Admitting: Family Medicine

## 2018-12-05 ENCOUNTER — Other Ambulatory Visit: Payer: Self-pay

## 2018-12-05 ENCOUNTER — Encounter: Payer: Self-pay | Admitting: Sports Medicine

## 2018-12-05 ENCOUNTER — Ambulatory Visit (INDEPENDENT_AMBULATORY_CARE_PROVIDER_SITE_OTHER): Payer: BC Managed Care – PPO | Admitting: Sports Medicine

## 2018-12-05 DIAGNOSIS — G8929 Other chronic pain: Secondary | ICD-10-CM | POA: Diagnosis not present

## 2018-12-05 DIAGNOSIS — M25511 Pain in right shoulder: Secondary | ICD-10-CM

## 2018-12-05 MED ORDER — MELOXICAM 15 MG PO TABS: ORAL_TABLET | ORAL | 3 refills | Status: DC

## 2018-12-05 NOTE — Progress Notes (Addendum)
Subjective:    I'm seeing this patient as a consultation for:  Dr. Maia Breslow  CC: Right shoulder pain  HPI: This is a pleasant 33 year old female, she used to work at [employer].  Starting in May she was moving some boxes, felt a pop and had severe pain in her right shoulder.  Ultimately she had an MRI that showed multiple pathologic changes, she did not have any therapy and was operated on, it sounds as though she had a subacromial decompression, had some physical therapy afterwards.  Unfortunately she did not improve, she had worsening of pain at the joint line, deltoid, worse with overhead activities and most movement with progressive loss of range of motion.  She has been using her left arm more and has noted worsening pain here as well.  Lately she is noted paresthesias into her hand and fingertips.  No progressive weakness.  I reviewed the past medical history, family history, social history, surgical history, and allergies today and no changes were needed.  Please see the problem list section below in epic for further details.  Past Medical History: Past Medical History:  Diagnosis Date  . Headache(784.0)   . Infection    UTI  . Medical history non-contributory    Past Surgical History: Past Surgical History:  Procedure Laterality Date  . CESAREAN SECTION N/A 12/11/2012   Procedure: CESAREAN SECTION;  Surgeon: Thurnell Lose, MD;  Location: [facility];  Service: Obstetrics;  Laterality: N/A;  . NO PAST SURGERIES    . SHOULDER SURGERY Right 09/28/2018   Social History: Social History   Socioeconomic History  . Marital status: Single    Spouse name: Not on file  . Number of children: Not on file  . Years of education: Not on file  . Highest education level: Not on file  Occupational History  . Not on file  Social Needs  . Financial resource strain: Not on file  . Food insecurity    Worry: Not on file    Inability: Not on file  . Transportation needs    Medical: Not on  file    Non-medical: Not on file  Tobacco Use  . Smoking status: Never Smoker  . Smokeless tobacco: Never Used  Substance and Sexual Activity  . Alcohol use: No  . Drug use: No  . Sexual activity: Not Currently    Comment: 4 months agomonths ago  Lifestyle  . Physical activity    Days per week: Not on file    Minutes per session: Not on file  . Stress: Not on file  Relationships  . Social Herbalist on phone: Not on file    Gets together: Not on file    Attends religious service: Not on file    Active member of club or organization: Not on file    Attends meetings of clubs or organizations: Not on file    Relationship status: Not on file  Other Topics Concern  . Not on file  Social History Narrative   ** Merged History Encounter **       Family History: Family History  Problem Relation Age of Onset  . Cancer Paternal Aunt        ? brain  . Cancer Paternal Grandmother        breast  . Hypertension Mother   . Diabetes Mother   . Diabetes Father    Allergies: No Known Allergies Medications: See med rec.  Review of Systems: No headache,  visual changes, nausea, vomiting, diarrhea, constipation, dizziness, abdominal pain, skin rash, fevers, chills, night sweats, weight loss, swollen lymph nodes, body aches, joint swelling, muscle aches, chest pain, shortness of breath, mood changes, visual or auditory hallucinations.   Objective:   General: Well Developed, well nourished, and in no acute distress.  Neuro:  Extra-ocular muscles intact, able to move all 4 extremities, sensation grossly intact.  Deep tendon reflexes tested were normal. Psych: Alert and oriented, mood congruent with affect. ENT:  Ears and nose appear unremarkable.  Hearing grossly normal. Neck: Unremarkable overall appearance, trachea midline.  No visible thyroid enlargement. Eyes: Conjunctivae and lids appear unremarkable.  Pupils equal and round. Skin: Warm and dry, no rashes noted.   Cardiovascular: Pulses palpable, no extremity edema. Right shoulder: Great difficulty with range of motion, external rotation to about 30 degrees, abduction to 30 degrees, all with severe pain.  Procedure: Real-time Ultrasound Guided injection of the right glenohumeral joint Device: GE Logiq E  Verbal informed consent obtained.  Time-out conducted.  Noted no overlying erythema, induration, or other signs of local infection.  Skin prepped in a sterile fashion.  Local anesthesia: Topical Ethyl chloride.  With sterile technique and under real time ultrasound guidance:  1 cc Kenalog 40, 2 cc lidocaine, 2 cc bupivacaine injected easily Completed without difficulty  Pain immediately resolved suggesting accurate placement of the medication.  Advised to call if fevers/chills, erythema, induration, drainage, or persistent bleeding.  Images permanently stored and available for review in the ultrasound unit.  Impression: Technically successful ultrasound guided injection.  Procedure: Real-time Ultrasound Guided injection of the right subacromial bursa Device: GE Logiq E  Verbal informed consent obtained.  Time-out conducted.  Noted no overlying erythema, induration, or other signs of local infection.  Skin prepped in a sterile fashion.  Local anesthesia: Topical Ethyl chloride.  With sterile technique and under real time ultrasound guidance:  Noted intact supraspinatus, 1 cc Kenalog 40, 1 cc lidocaine, 1 cc bupivacaine injected easily Completed without difficulty  Pain immediately resolved suggesting accurate placement of the medication.  Advised to call if fevers/chills, erythema, induration, drainage, or persistent bleeding.  Images permanently stored and available for review in the ultrasound unit.  Impression: Technically successful ultrasound guided injection.  Impression and Recommendations:   This case required medical decision making of moderate complexity.  Chronic right shoulder  pain Post subacromial decompression, IGHL release. Having some adhesive capsulitis. Subacromial symptoms as well. I think her distal paresthesias are related to abnormal shoulder biomechanics and will resolve with treatment of the primary issue in her shoulder. Glenohumeral and subacromial injections as above. My initial plan was to do an MR arthrogram with gadolinium and saline injected into her glenohumeral joint however her insurance requires MRIs to be done at an outside facility. Meloxicam daily. I do think she is under-rehabbed so I would like her to do some PT here as well for at least 6 weeks. Ultimately she needs to follow up with her surgeon but I am happy to render my opinion and an injection to calm things down.  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  She did not do well with subacromial and glenohumeral steroid injections.  This would be a good target for a subacromial ultrasound-guided injection/insertional tenotomy with PRP.   ___________________________________________ Ihor Austin. Benjamin Stain, M.D., ABFM., CAQSM. Primary Care and Sports Medicine [facility]  Adjunct Professor  of [employer]

## 2018-12-05 NOTE — Assessment & Plan Note (Addendum)
Post subacromial decompression, IGHL release. Having some adhesive capsulitis. Subacromial symptoms as well. I think her distal paresthesias are related to abnormal shoulder biomechanics and will resolve with treatment of the primary issue in her shoulder. Glenohumeral and subacromial injections as above. My initial plan was to do an MR arthrogram with gadolinium and saline injected into her glenohumeral joint however her insurance requires MRIs to be done at an outside facility. Meloxicam daily. I do think she is under-rehabbed so I would like her to do some PT here as well for at least 6 weeks. Ultimately she needs to follow up with her surgeon but I am happy to render my opinion and an injection to calm things down.  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  She did not do well with subacromial and glenohumeral steroid injections.  This would be a good target for a subacromial ultrasound-guided injection/insertional tenotomy with PRP.

## 2018-12-08 ENCOUNTER — Ambulatory Visit (HOSPITAL_COMMUNITY): Payer: BC Managed Care – PPO | Attending: Cardiovascular Disease

## 2018-12-08 ENCOUNTER — Other Ambulatory Visit: Payer: Self-pay

## 2018-12-08 DIAGNOSIS — R55 Syncope and collapse: Secondary | ICD-10-CM | POA: Insufficient documentation

## 2018-12-08 DIAGNOSIS — R079 Chest pain, unspecified: Secondary | ICD-10-CM | POA: Diagnosis not present

## 2018-12-12 ENCOUNTER — Ambulatory Visit: Payer: BC Managed Care – PPO | Attending: Sports Medicine | Admitting: Physical Therapy

## 2018-12-16 ENCOUNTER — Other Ambulatory Visit: Payer: Self-pay

## 2018-12-16 ENCOUNTER — Encounter: Payer: Self-pay | Admitting: Sports Medicine

## 2018-12-16 ENCOUNTER — Ambulatory Visit (INDEPENDENT_AMBULATORY_CARE_PROVIDER_SITE_OTHER): Payer: BC Managed Care – PPO | Admitting: Sports Medicine

## 2018-12-16 DIAGNOSIS — M25511 Pain in right shoulder: Secondary | ICD-10-CM

## 2018-12-16 DIAGNOSIS — G8929 Other chronic pain: Secondary | ICD-10-CM | POA: Diagnosis not present

## 2018-12-16 NOTE — Patient Instructions (Signed)
 Platelet-rich plasma is used in musculoskeletal medicine to focus your own body's ability to heal. It has several well-done published randomized control trials (RCT) which demonstrate both its effectiveness and safety in many musculoskeletal conditions, including osteoarthritis, tendinopathies, and damaged vertebral discs. PRP has been in clinical use since the 1990's. Many people know that platelets form a clot if there is a cut in the skin. It turns out that platelets do not only form a clot, they also start the body's own repair process. When platelets activate to form a clot, they also release alpha granules which have hundreds of chemical messengers in them that initiate and organize repair to the damaged tissue. Precisely placing PRP into the site of injury will initiate the healing process by activating on the damaged cartilage or tendon. This is an inflammatory process, and inflammation is the vital first phase of Healing.  What to expect and how to prepare for PRP  . 2 weeks prior to the procedure: depending on the procedure, you may need to arrange for a driver to bring you home. IF you are having a lower extremity procedure, we can provide crutches as needed.  . 7 days prior to the procedure: Stop taking anti-inflammatory drugs like ibuprofen, Naprosyn, Celebrex, or Meloxicam. Let your doctor know if you have been taking prednisone or other corticosteroids in the last month.  . The day before the procedure: thoroughly shower and clean your skin.   . The day of the procedure: Wear loose-fitting clothing like sweatpants or shorts. If you are having an upper body procedure wear a top that can button or zip up.  PRP will initiate healing and a productive inflammation, and PRP therapy will make the body part treated sore for 4 days to two weeks. Anti-inflammatory drugs (i.e. ibuprofen, Naprosyn, Celebrex) and corticosteroids such as prednisone can blunt or stop this process, so  it is important to not take any anti-inflammatory drugs for 7 days before getting PRP therapy, or for at least three weeks after PRP therapy. Corticosteroid injections can blunt inflammation for 30 days, so let us know if you have had one recently. Depending on the body part injected, you may be in a sling or on crutches for several days. Just like wringing out a wet dishcloth, if you load or tense a tendon or ligament that has just been injected with PRP, some of the PRP injected will squish out. By keeping the body part treated relaxed by using a sling (for the shoulder or arm) or crutches (for hips and legs) for a few days, the PRP can bind in place and do its job.   You may need a driver to bring you home.  Tobacco?nicotine is a potent toxin and its use constricts small blood vessels which are needed for tissue repair.  Tobacco/nicotine use will limit the effectiveness of any treatment and stopping tobacco use is one of the single  greatest actions you can take to improve your health. Avoid toxins like alcohol, which inhibits and depresses the cells needed for tissue repair.  What happens during the PRP procedure?  Platelet rich plasma is made by taking some of your blood and performing a two-stage centrifuge process on it to concentrate the PRP. First, your blood is drawn into a syringe with a small amount of anti-coagulant in it (this is to keep the blood from clotting during this process). The amount of blood drawn is usually about 30-60 milliliters, depending on how much PRP is needed for   the treatment.  (There are 355 milliliters in a 12-ounce soda can for comparison).  Then the blood is transferred in a sterile fashion into a centrifuge tube. It is then centrifuged for the first cycle where the red blood cells are isolated and discarded. In the second centrifuge cycle, the platelet-rich fraction of the remaining plasma is concentrated and placed in a syringe. The skin at the  injection site is numbed with a small amount of topical cooling spray. Dr Jassmin Kemmerer will then precisely inject the PRP into the injury site using ultrasound guidance.  What to do after your procedure  I will give you specific medicine to control any discomfort you may have after the procedure. Avoid NSAIDs like ibuprofen. Acetaminophen can be used for mild pain.  Depending on the part of the body treated, usually you will be placed in a sling or on crutches for 1 to 3 days. Do your best not to tense or load the treated area during this time. After 3 days, unless otherwise instructed, the treated body part should be used and slowly moved through its full range of motion. It will be sore, but you will not be doing damage by moving it, in fact it needs to move to heal. If you were on crutches for a period of time, walking is ok once you are off the crutches. For now, avoid activities that specifically hurt you before being treated. Exercise is vital to good health and finding a way to cross train around your injury is important not only for your physical health, but for your mental health as well. Ask me about cross training options for your injury. Some brief (10 minutes or less) period of heat or ice therapy will not hurt the therapy, but it is not required. Usually, depending on the initial injury, physical therapy is started from two weeks to four weeks after injection. Improvements in pain and function should be expected from 8 weeks to 12 weeks after injection and some injuries may require more than one treatment.    ___________________________________________ Jackie Brooks, M.D., ABFM., CAQSM. Primary Care and Sports Medicine [facility]  Adjunct Professor of Family Medicine  [employer]   

## 2018-12-16 NOTE — Assessment & Plan Note (Addendum)
Status post subacromial decompression, rotator cuff debridement back in August. She is had physical therapy, persistent pain, anterior and lateral. We did subacromial and glenohumeral injections the last visit, she had only a few days of relief. Recent MRI shows partial-thickness bursal tearing of the supraspinatus, subacromial bursitis, the only thing I can think of next is a subacromial injection of PRP, we would likely do a mild tenotomy as well. Before we do this I would like to have her get a second surgical opinion from Dr. Griffin Basil, she has her MRI disc for him to review.  Records will be faxed to [facility] law group, attention paralegal Terrence Dupont, fax: [phone].

## 2018-12-16 NOTE — Progress Notes (Addendum)
Subjective:    CC: Follow-up  HPI: This is a 33 year old female, she has chronic right shoulder pain, she is post subacromial decompression and debridement back in August, she had a month of physical therapy, unfortunately had persistent pain.  She presented to me for a second opinion, we did a subacromial and a glenohumeral injection, and obtained a new MRI the results of which will be dictated below.  She had a few days of relief and then the pain came back.  She is here for further evaluation.  I reviewed the past medical history, family history, social history, surgical history, and allergies today and no changes were needed.  Please see the problem list section below in epic for further details.  Past Medical History: Past Medical History:  Diagnosis Date  . Headache(784.0)   . Infection    UTI  . Medical history non-contributory    Past Surgical History: Past Surgical History:  Procedure Laterality Date  . CESAREAN SECTION N/A 12/11/2012   Procedure: CESAREAN SECTION;  Surgeon: Geryl Rankins, MD;  Location: [facility];  Service: Obstetrics;  Laterality: N/A;  . NO PAST SURGERIES    . SHOULDER SURGERY Right 09/28/2018   Social History: Social History   Socioeconomic History  . Marital status: Single    Spouse name: Not on file  . Number of children: Not on file  . Years of education: Not on file  . Highest education level: Not on file  Occupational History  . Not on file  Social Needs  . Financial resource strain: Not on file  . Food insecurity    Worry: Not on file    Inability: Not on file  . Transportation needs    Medical: Not on file    Non-medical: Not on file  Tobacco Use  . Smoking status: Never Smoker  . Smokeless tobacco: Never Used  Substance and Sexual Activity  . Alcohol use: No  . Drug use: No  . Sexual activity: Not Currently    Comment: 4 months agomonths ago  Lifestyle  . Physical activity    Days per week: Not on file    Minutes per  session: Not on file  . Stress: Not on file  Relationships  . Social Musician on phone: Not on file    Gets together: Not on file    Attends religious service: Not on file    Active member of club or organization: Not on file    Attends meetings of clubs or organizations: Not on file    Relationship status: Not on file  Other Topics Concern  . Not on file  Social History Narrative   ** Merged History Encounter **       Family History: Family History  Problem Relation Age of Onset  . Cancer Paternal Aunt        ? brain  . Cancer Paternal Grandmother        breast  . Hypertension Mother   . Diabetes Mother   . Diabetes Father    Allergies: No Known Allergies Medications: See med rec.  Review of Systems: No fevers, chills, night sweats, weight loss, chest pain, or shortness of breath.   Objective:    General: Well Developed, well nourished, and in no acute distress.  Neuro: Alert and oriented x3, extra-ocular muscles intact, sensation grossly intact.  HEENT: Normocephalic, atraumatic, pupils equal round reactive to light, neck supple, no masses, no lymphadenopathy, thyroid nonpalpable.  Skin: Warm and dry,  no rashes. Cardiac: Regular rate and rhythm, no murmurs rubs or gallops, no lower extremity edema.  Respiratory: Clear to auscultation bilaterally. Not using accessory muscles, speaking in full sentences.  MRI showed a small bursal sided partial-thickness supraspinatus tear with subacromial bursitis, tendinosis in the other tendons.  Impression and Recommendations:    Chronic right shoulder pain Status post subacromial decompression, rotator cuff debridement back in August. She is had physical therapy, persistent pain, anterior and lateral. We did subacromial and glenohumeral injections the last visit, she had only a few days of relief. Recent MRI shows partial-thickness bursal tearing of the supraspinatus, subacromial bursitis, the only thing I can think  of next is a subacromial injection of PRP, we would likely do a mild tenotomy as well. Before we do this I would like to have her get a second surgical opinion from Dr. Griffin Basil, she has her MRI disc for him to review.  Records will be faxed to [facility] law group, attention paralegal Terrence Dupont, fax: [phone].   ___________________________________________ Gwen Her. Dianah Field, M.D., ABFM., CAQSM. Primary Care and Sports Medicine [facility]  Adjunct Professor of [employer]

## 2018-12-26 ENCOUNTER — Encounter: Payer: BC Managed Care – PPO | Admitting: Sports Medicine

## 2018-12-29 ENCOUNTER — Encounter: Payer: Medicaid Other | Admitting: Obstetrics and Gynecology

## 2019-02-03 ENCOUNTER — Encounter: Payer: Self-pay | Admitting: Obstetrics and Gynecology

## 2019-02-03 ENCOUNTER — Encounter: Payer: Medicaid Other | Admitting: Obstetrics and Gynecology

## 2019-03-03 ENCOUNTER — Ambulatory Visit (INDEPENDENT_AMBULATORY_CARE_PROVIDER_SITE_OTHER): Payer: Medicaid Other | Admitting: *Deleted

## 2019-03-03 ENCOUNTER — Other Ambulatory Visit: Payer: Self-pay

## 2019-03-03 ENCOUNTER — Other Ambulatory Visit (HOSPITAL_COMMUNITY)
Admission: RE | Admit: 2019-03-03 | Discharge: 2019-03-03 | Disposition: A | Discharge status: Home / Self Care | Payer: BC Managed Care – PPO | Source: Ambulatory Visit | Attending: Obstetrics and Gynecology | Admitting: Obstetrics and Gynecology

## 2019-03-03 VITALS — BP 121/81 | HR 83 | Temp 97.9°F | Ht 59.0 in | Wt 143.6 lb

## 2019-03-03 DIAGNOSIS — N898 Other specified noninflammatory disorders of vagina: Secondary | ICD-10-CM | POA: Diagnosis not present

## 2019-03-03 MED ORDER — METRONIDAZOLE 0.75 % VA GEL: 1.0000 | Freq: Two times a day (BID) | VAGINAL | 0 refills | Status: DC

## 2019-03-03 MED ORDER — FLUCONAZOLE 150 MG PO TABS: 150.0000 mg | ORAL_TABLET | Freq: Once | ORAL | 0 refills | Status: AC

## 2019-03-03 NOTE — Progress Notes (Signed)
Agree with A & P. 

## 2019-03-03 NOTE — Progress Notes (Signed)
Pt reports she has had recurrent BV for years and has been treated multiple times with Metrogel, Metronidazole and most recently Clindamycin suppositories. These medications remedy the problem for a short period of time and then it returns. Currently she has irritation, yellow discharge, odor and itching. These symptoms started 6 weeks ago. In addition to testing for BV and yeast, pt desires testing for Trich, GC and CT. Self swab obtained and sent to lab. Consult w/Dr. Alysia Penna who approved Rx to be snet in for Metrogel and Fluconazole. Pt has New Gyn appt in office as referred by her PCP for this problem.

## 2019-03-06 LAB — CERVICOVAGINAL ANCILLARY ONLY
Bacterial Vaginitis (gardnerella): POSITIVE — AB
Candida Glabrata: NEGATIVE
Candida Vaginitis: POSITIVE — AB
Chlamydia: NEGATIVE
Comment: NEGATIVE
Comment: NEGATIVE
Comment: NEGATIVE
Comment: NEGATIVE
Comment: NEGATIVE
Comment: NORMAL
Neisseria Gonorrhea: NEGATIVE
Trichomonas: NEGATIVE

## 2019-03-07 ENCOUNTER — Other Ambulatory Visit: Payer: Self-pay | Admitting: Lactation Services

## 2019-03-07 MED ORDER — METRONIDAZOLE 500 MG PO TABS: 500.0000 mg | ORAL_TABLET | Freq: Two times a day (BID) | ORAL | 0 refills | Status: DC

## 2019-03-07 MED ORDER — FLUCONAZOLE 150 MG PO TABS: 150.0000 mg | ORAL_TABLET | Freq: Once | ORAL | 0 refills | Status: AC

## 2019-03-07 NOTE — Progress Notes (Signed)
Orders per Dr. Alysia Penna,

## 2019-03-15 ENCOUNTER — Encounter: Payer: BC Managed Care – PPO | Admitting: Obstetrics and Gynecology

## 2019-03-15 ENCOUNTER — Telehealth: Payer: BC Managed Care – PPO | Admitting: Obstetrics and Gynecology

## 2019-03-27 ENCOUNTER — Encounter: Payer: BC Managed Care – PPO | Admitting: Family Medicine

## 2019-03-28 ENCOUNTER — Other Ambulatory Visit (HOSPITAL_COMMUNITY)
Admission: RE | Admit: 2019-03-28 | Discharge: 2019-03-28 | Disposition: A | Discharge status: Home / Self Care | Payer: BC Managed Care – PPO | Source: Ambulatory Visit | Attending: Obstetrics and Gynecology | Admitting: Obstetrics and Gynecology

## 2019-03-28 ENCOUNTER — Other Ambulatory Visit: Payer: Self-pay

## 2019-03-28 ENCOUNTER — Ambulatory Visit (INDEPENDENT_AMBULATORY_CARE_PROVIDER_SITE_OTHER): Payer: Medicaid Other

## 2019-03-28 VITALS — BP 111/71 | HR 78 | Wt 145.2 lb

## 2019-03-28 DIAGNOSIS — Z0000 Encounter for general adult medical examination without abnormal findings: Secondary | ICD-10-CM | POA: Diagnosis not present

## 2019-03-28 DIAGNOSIS — N760 Acute vaginitis: Secondary | ICD-10-CM

## 2019-03-28 DIAGNOSIS — Z01419 Encounter for gynecological examination (general) (routine) without abnormal findings: Secondary | ICD-10-CM

## 2019-03-28 DIAGNOSIS — N898 Other specified noninflammatory disorders of vagina: Secondary | ICD-10-CM

## 2019-03-28 DIAGNOSIS — B9689 Other specified bacterial agents as the cause of diseases classified elsewhere: Secondary | ICD-10-CM

## 2019-03-28 DIAGNOSIS — Z803 Family history of malignant neoplasm of breast: Secondary | ICD-10-CM

## 2019-03-28 MED ORDER — BORIC ACID POWD
0 refills | Status: DC
Start: 2019-03-28 — End: 2022-03-17

## 2019-03-28 MED ORDER — FLUCONAZOLE 150 MG PO TABS: 150.0000 mg | ORAL_TABLET | Freq: Every day | ORAL | 0 refills | Status: DC

## 2019-03-28 NOTE — Progress Notes (Signed)
GYNECOLOGY OFFICE VISIT NOTE-WELL WOMAN EXAM  History:   Zabella L Dao [id number] here today for annual exam. She complains of vaginal discharge and states she has bacterial vaginosis.  She reports she was last treated in January with temporary improvement of symptoms and new onset yeast.  She states she completed her Metronidazole one week ago and took a Diflucan the next day.  She denies issues bleeding, pelvic pain, or dyspareunia.   Ms. Varnadore states her LMP was Jan 19 or 20th lasting normal 7 days and with moderate flow.  She reports cramping, but denies passing of clots.  She is currently not on any hormonal birth control and declines.  She states she is currently not sexually active due to the BV symptoms and reoccurrence.  She reports having one partner in the last year and declines STD testing.   Ms. Fennema states she checks her breast monthly and endorses breast awareness.  She endorses paternal grandmother who died from breast cancer complications.  She also states her maternal grandmother was also diagnosed with breast cancer.  She states she attempted to get a mammogram for screening purposes, but was told she had to wait less any symptoms.  Ms. Holsclaw denies family history of uterine, cervical, or ovarian cancer.  Ms. Tompkins sees Dr. Jacklyn Shell for her primary care concerns and is not being currenlty managed for any conditions.  She endorses exercise 5x week in variable forms. She denies balanced nutritional intake and does not partake in tobacco, drug, or alcohol usage.   Ms. Stracener does not work outside the home, but endorses safety at home. She feels that she does not have good social support and denies any DV/A in the past year.    Past Medical History:  Diagnosis Date  . Headache(784.0)   . Infection    UTI  . Medical history non-contributory     Past Surgical History:  Procedure Laterality Date  . CESAREAN SECTION N/A 12/11/2012   Procedure: CESAREAN SECTION;  Surgeon:  Thurnell Lose, MD;  Location: [facility];  Service: Obstetrics;  Laterality: N/A;  . NO PAST SURGERIES    . SHOULDER SURGERY Right 09/28/2018    The following portions of the patient's history were reviewed and updated as appropriate: allergies, current medications, past family history, past medical history, past social history, past surgical history and problem list.   Health Maintenance:  Normal pap and negative HRHPV in 2014.  No mammogram d/t age.   Review of Systems:  Pertinent items noted in HPI and remainder of comprehensive ROS otherwise negative.    Objective:    Physical Exam BP 111/71   Pulse 78   Wt 145 lb 3.2 oz (65.9 kg)   LMP 03/14/2019 (Approximate)   BMI 29.33 kg/m  Physical Exam Exam conducted with a chaperone present.  Constitutional:      Appearance: Normal appearance.  HENT:     Head: Normocephalic and atraumatic.  Eyes:     Conjunctiva/sclera: Conjunctivae normal.  Neck:     Thyroid: No thyroid mass, thyromegaly or thyroid tenderness.  Cardiovascular:     Rate and Rhythm: Normal rate and regular rhythm.     Heart sounds: Normal heart sounds.  Pulmonary:     Breath sounds: Normal breath sounds.  Chest:     Comments: CBE Declined Abdominal:     General: Bowel sounds are normal.     Palpations: Abdomen is soft.     Tenderness: There is no abdominal tenderness.  Genitourinary:    General: Normal vulva.     Labia:        Right: No tenderness or lesion.        Left: No tenderness or lesion.      Vagina: Vaginal discharge (Moderate amt white gritty discharge noted. No apparent odor.) present. No bleeding.     Cervix: No cervical motion tenderness, discharge, friability or cervical bleeding.     Uterus: Normal.      Comments: CV and Pap collected. No tenderness in cul de sac.  Musculoskeletal:        General: Normal range of motion.     Cervical back: Normal range of motion.  Skin:    General: Skin is warm and dry.  Neurological:     Mental Status:  She is alert.  Psychiatric:        Mood and Affect: Mood normal.        Behavior: Behavior normal.        Thought Content: Thought content normal.      Labs and Imaging No results found for this or any previous visit (from the past 168 hour(s)). No results found.   Assessment & Plan:       1. Well woman exam with routine gynecological exam -Exam performed and findings discussed. -Educated on ASCCP guidelines regarding pap smear evaluation and frequency. -Informed of turnover time and provider/clinic policy on releasing results. -Encouraged to activate Mychart. -Educated on AHA exercise recommendations of 30 minutes of moderate to vigorous activity at least 5x/week. -Educated and encouraged to continue monthly SBE with increased breast awareness including examination of breast for skin changes, moles, tenderness, etc.   - Cytology - PAP( [place])   2. BV (bacterial vaginosis) -Reviewed chronic BV and what factors can contribute to symptoms including unprotected sex, usage of scented pads/panty liners, and cleaning agents. -Discussed usage of boric acid vaginal suppositories for treatment. -Instructed to start taking every day for 21 days, including during menses, then once a week for 9 weeks. -Informed to wait for CV results to assess need for treatment with metrogel. -Patient cautioned that boric acid is a vaginal suppository and should not be ingested.   3. Vaginal itching -Rx for Diflucan sent as exam finding suspicious for yeast.   4. Family history of breast cancer -Patient informed that recommendation, per ACS, is for breast cancer screening at at 40.  Routine preventative health maintenance measures emphasized. Please refer to After Visit Summary for other counseling recommendations.   No follow-ups on file.      Cherre Robins, CNM 03/28/2019

## 2019-03-28 NOTE — Patient Instructions (Signed)
Primary care accepting new patients:   [facility] at [facility] [address] [place],  [place]  [address] [phone]  [facility] [address] [place],  [place]  [address] [phone]

## 2019-03-29 DIAGNOSIS — Z803 Family history of malignant neoplasm of breast: Secondary | ICD-10-CM | POA: Insufficient documentation

## 2019-03-29 LAB — CERVICOVAGINAL ANCILLARY ONLY
Bacterial Vaginitis (gardnerella): NEGATIVE
Candida Glabrata: NEGATIVE
Candida Vaginitis: NEGATIVE
Comment: NEGATIVE
Comment: NEGATIVE
Comment: NEGATIVE

## 2019-03-30 ENCOUNTER — Ambulatory Visit (INDEPENDENT_AMBULATORY_CARE_PROVIDER_SITE_OTHER): Payer: BC Managed Care – PPO | Admitting: Family Medicine

## 2019-03-30 ENCOUNTER — Other Ambulatory Visit: Payer: Self-pay

## 2019-03-30 ENCOUNTER — Encounter: Payer: Self-pay | Admitting: Family Medicine

## 2019-03-30 VITALS — BP 124/76 | HR 79 | Temp 98.3°F | Ht 59.0 in | Wt 146.0 lb

## 2019-03-30 DIAGNOSIS — M25572 Pain in left ankle and joints of left foot: Secondary | ICD-10-CM | POA: Diagnosis not present

## 2019-03-30 DIAGNOSIS — L600 Ingrowing nail: Secondary | ICD-10-CM | POA: Diagnosis not present

## 2019-03-30 LAB — CYTOLOGY - PAP
Adequacy: ABSENT
Comment: NEGATIVE
Diagnosis: NEGATIVE
High risk HPV: NEGATIVE

## 2019-03-30 MED ORDER — DOXYCYCLINE HYCLATE 100 MG PO TABS: 100.0000 mg | ORAL_TABLET | Freq: Two times a day (BID) | ORAL | 0 refills | Status: DC

## 2019-03-30 NOTE — Progress Notes (Signed)
Subjective:  Patient ID: Jackie Brooks, female    DOB: Jul 08, 1985  Age: 34 y.o. MRN: [id number]  CC:  Chief Complaint  Patient presents with  . Foot Pain    started on 03/27/2019. pt states she walking into the gym on that day and her L foot big toe started throbing and acheing. toe was swollen and red around the nail bed. pt has iced the toe and it helped a little bit. pt states she has looked closely at the toe and dosent see any sighns of athlets foot.     HPI [name] presents for   L great toe pain, swelling: Past 3-4 days. Sore to put on shoe/rubbing on area.  Some redness/swelling.  No discharge.  No fever.   Tx: ice, warm soaks. Less swelling, but still sore.   History Patient Active Problem List   Diagnosis Date Noted  . Family history of breast cancer 03/29/2019  . Chronic right shoulder pain 12/05/2018  . Bacterial vaginitis 11/22/2018  . Ileus, postoperative ([place]) 12/15/2012  . Edema 12/15/2012  . Anemia 12/14/2012  . Status post primary low transverse cesarean section 12/14/2012  . Post-dates pregnancy 12/09/2012  . Late prenatal care 09/02/2012  . Migraines 09/02/2012   Past Medical History:  Diagnosis Date  . Headache(784.0)   . Infection    UTI  . Medical history non-contributory    Past Surgical History:  Procedure Laterality Date  . CESAREAN SECTION N/A 12/11/2012   Procedure: CESAREAN SECTION;  Surgeon: Thurnell Lose, MD;  Location: [facility];  Service: Obstetrics;  Laterality: N/A;  . NO PAST SURGERIES    . SHOULDER SURGERY Right 09/28/2018   No Known Allergies Prior to Admission medications   Medication Sig Start Date End Date Taking? Authorizing Provider  acetaminophen (TYLENOL 8 HOUR) 650 MG CR tablet Take 650 mg by mouth every 8 (eight) hours as needed for pain.   Yes [provider]  Boric Acid POWD Place one suppository in the vagina every night before bed. 03/28/19  Yes Gavin Pound, CNM  fluconazole (DIFLUCAN) 150 MG  tablet Take 1 tablet (150 mg total) by mouth daily. 03/28/19  Yes Gavin Pound, CNM  oxyCODONE-acetaminophen (PERCOCET/ROXICET) 5-325 MG tablet Take by mouth every 4 (four) hours as needed for severe pain.   Yes [provider]  diphenhydrAMINE (BENADRYL) 25 mg capsule Take 25 mg by mouth every 6 (six) hours as needed.    [provider]  famotidine (PEPCID) 20 MG tablet Take 1 tablet (20 mg total) by mouth 2 (two) times daily. Patient not taking: Reported on 09/26/2014 09/02/13 11/15/14  Noemi Chapel, MD   Social History   Socioeconomic History  . Marital status: Single    Spouse name: Not on file  . Number of children: Not on file  . Years of education: Not on file  . Highest education level: Not on file  Occupational History  . Not on file  Tobacco Use  . Smoking status: Never Smoker  . Smokeless tobacco: Never Used  Substance and Sexual Activity  . Alcohol use: No  . Drug use: No  . Sexual activity: Not Currently    Comment: 4 months agomonths ago  Other Topics Concern  . Not on file  Social History Narrative   ** Merged History Encounter **       Social Determinants of Health   Financial Resource Strain:   . Difficulty of Paying Living Expenses: Not on file  Food Insecurity:   .  Worried About Programme researcher, broadcasting/film/video in the Last Year: Not on file  . Ran Out of Food in the Last Year: Not on file  Transportation Needs:   . Lack of Transportation (Medical): Not on file  . Lack of Transportation (Non-Medical): Not on file  Physical Activity:   . Days of Exercise per Week: Not on file  . Minutes of Exercise per Session: Not on file  Stress:   . Feeling of Stress : Not on file  Social Connections:   . Frequency of Communication with Friends and Family: Not on file  . Frequency of Social Gatherings with Friends and Family: Not on file  . Attends Religious Services: Not on file  . Active Member of Clubs or Organizations: Not on file  . Attends Tax inspector Meetings: Not on file  . Marital Status: Not on file  Intimate Partner Violence:   . Fear of Current or Ex-Partner: Not on file  . Emotionally Abused: Not on file  . Physically Abused: Not on file  . Sexually Abused: Not on file    Review of Systems Per HPI;   Objective:   Vitals:   03/30/19 1406  BP: 124/76  Pulse: 79  Temp: 98.3 F (36.8 C)  TempSrc: Temporal  SpO2: 99%  Weight: 146 lb (66.2 kg)  Height: 4\' 11"  (1.499 m)     Physical Exam Vitals reviewed.  Constitutional:      General: She is not in acute distress.    Appearance: She is well-developed.  HENT:     Head: Normocephalic and atraumatic.  Cardiovascular:     Rate and Rhythm: Normal rate.  Pulmonary:     Effort: Pulmonary effort is normal.  Musculoskeletal:       Feet:  Neurological:     Mental Status: She is alert and oriented to person, place, and time.      Assessment & Plan:  JEANINNE LODICO is a 34 y.o. female . Ingrowing left great toenail - Plan: doxycycline (VIBRA-TABS) 100 MG tablet, Ambulatory referral to Podiatry  Pain of joint of left ankle and foot - Plan: Ambulatory referral to Podiatry Suspected early ingrown toenail, left great lateral nail fold.  Slight improvement in swelling with home treatment, no definite sign of infection at this time, doxycycline prescribed with precautions on when to start.  Refer to podiatry next few days, handout given, avoidance of constrictive footwear with RTC precautions given.    No orders of the defined types were placed in this encounter.  Patient Instructions       If you have lab work done today you will be contacted with your lab results within the next 2 weeks.  If you have not heard from 20 then please contact us. The fastest way to get your results is to register for My Chart.   IF you received an x-ray today, you will receive an invoice from [facility]. Please contact [facility] at [phone] with  questions or concerns regarding your invoice.   IF you received labwork today, you will receive an invoice from [facility]. Please contact [facility] at [phone] with questions or concerns regarding your invoice.   Our billing staff will not be able to assist you with questions regarding bills from these companies.  You will be contacted with the lab results as soon as they are available. The fastest way to get your results is to activate your My Chart account. Instructions are located on the last page of  this paperwork. If you have not heard from [place] regarding the results in 2 weeks, please contact this office.         Signed, Merri Ray, MD [facility]

## 2019-03-30 NOTE — Patient Instructions (Addendum)
I suspect you have an ingrown toenail.  I do not see obvious signs of infection at this time, okay to continue soaks, avoid direct pressure to that area, I will refer you to podiatry as you may need excision of part of the toenail.  If any increase in redness or discharge, start antibiotics that were printed today.  See information below.  Thank you for coming in today.   Return to the clinic or go to the nearest emergency room if any of your symptoms worsen or new symptoms occur.   Ingrown Toenail An ingrown toenail occurs when the corner or sides of a toenail grow into the surrounding skin. This causes discomfort and pain. The big toe is most commonly affected, but any of the toes can be affected. If an ingrown toenail is not treated, it can become infected. What are the causes? This condition may be caused by:  Wearing shoes that are too small or tight.  An injury, such as stubbing your toe or having your toe stepped on.  Improper cutting or care of your toenails.  Having nail or foot abnormalities that were present from birth (congenital abnormalities), such as having a nail that is too big for your toe. What increases the risk? The following factors may make you more likely to develop ingrown toenails:  Age. Nails tend to get thicker with age, so ingrown nails are more common among older people.  Cutting your toenails incorrectly, such as cutting them very short or cutting them unevenly. An ingrown toenail is more likely to get infected if you have:  Diabetes.  Blood flow (circulation) problems. What are the signs or symptoms? Symptoms of an ingrown toenail may include:  Pain, soreness, or tenderness.  Redness.  Swelling.  Hardening of the skin that surrounds the toenail. Signs that an ingrown toenail may be infected include:  Fluid or pus.  Symptoms that get worse instead of better. How is this diagnosed? An ingrown toenail may be diagnosed based on your medical  history, your symptoms, and a physical exam. If you have fluid or blood coming from your toenail, a sample may be collected to test for the specific type of bacteria that is causing the infection. How is this treated? Treatment depends on how severe your ingrown toenail is. You may be able to care for your toenail at home.  If you have an infection, you may be prescribed antibiotic medicines.  If you have fluid or pus draining from your toenail, your health care provider may drain it.  If you have trouble walking, you may be given crutches to use.  If you have a severe or infected ingrown toenail, you may need a procedure to remove part or all of the nail. Follow these instructions at home: Foot care   Do not pick at your toenail or try to remove it yourself.  Soak your foot in warm, soapy water. Do this for 20 minutes, 3 times a day, or as often as told by your health care provider. This helps to keep your toe clean and keep your skin soft.  Wear shoes that fit well and are not too tight. Your health care provider may recommend that you wear open-toed shoes while you heal.  Trim your toenails regularly and carefully. Cut your toenails straight across to prevent injury to the skin at the corners of the toenail. Do not cut your nails in a curved shape.  Keep your feet clean and dry to help prevent infection.  Medicines  Take over-the-counter and prescription medicines only as told by your health care provider.  If you were prescribed an antibiotic, take it as told by your health care provider. Do not stop taking the antibiotic even if you start to feel better. Activity  Return to your normal activities as told by your health care provider. Ask your health care provider what activities are safe for you.  Avoid activities that cause pain. General instructions  If your health care provider told you to use crutches to help you move around, use them as instructed.  Keep all follow-up  visits as told by your health care provider. This is important. Contact a health care provider if:  You have more redness, swelling, pain, or other symptoms that do not improve with treatment.  You have fluid, blood, or pus coming from your toenail. Get help right away if:  You have a red streak on your skin that starts at your foot and spreads up your leg.  You have a fever. Summary  An ingrown toenail occurs when the corner or sides of a toenail grow into the surrounding skin. This causes discomfort and pain. The big toe is most commonly affected, but any of the toes can be affected.  If an ingrown toenail is not treated, it can become infected.  Fluid or pus draining from your toenail is a sign of infection. Your health care provider may need to drain it. You may be given antibiotics to treat the infection.  Trimming your toenails regularly and properly can help you prevent an ingrown toenail. This information is not intended to replace advice given to you by your health care provider. Make sure you discuss any questions you have with your health care provider. Document Revised: 06/03/2018 Document Reviewed: 10/28/2016 Elsevier Patient Education  [employer].   If you have lab work done today you will be contacted with your lab results within the next 2 weeks.  If you have not heard from [place] then please contact us. The fastest way to get your results is to register for My Chart.   IF you received an x-ray today, you will receive an invoice from [facility]. Please contact [facility] at [phone] with questions or concerns regarding your invoice.   IF you received labwork today, you will receive an invoice from [facility]. Please contact [facility] at [phone] with questions or concerns regarding your invoice.   Our billing staff will not be able to assist you with questions regarding bills from these companies.  You will be contacted with the lab  results as soon as they are available. The fastest way to get your results is to activate your My Chart account. Instructions are located on the last page of this paperwork. If you have not heard from [place] regarding the results in 2 weeks, please contact this office.

## 2019-04-05 ENCOUNTER — Ambulatory Visit: Payer: BC Managed Care – PPO | Admitting: Podiatry

## 2019-04-21 ENCOUNTER — Ambulatory Visit (INDEPENDENT_AMBULATORY_CARE_PROVIDER_SITE_OTHER): Payer: Medicaid Other | Admitting: Podiatry

## 2019-04-21 ENCOUNTER — Other Ambulatory Visit: Payer: Self-pay

## 2019-04-21 DIAGNOSIS — L600 Ingrowing nail: Secondary | ICD-10-CM | POA: Diagnosis not present

## 2019-04-21 MED ORDER — NEOMYCIN-POLYMYXIN-HC 3.5-10000-1 OT SOLN: OTIC | 1 refills | Status: DC

## 2019-04-21 NOTE — Patient Instructions (Signed)

## 2019-04-21 NOTE — Progress Notes (Signed)
Subjective:   Patient ID: Jackie Brooks, female   DOB: 34 y.o.   MRN: [id number]   HPI Patient states she is had a lot of pain for at least a month and her left big toe and states that the toenail is irritated in the corner and making it hard for her to wear shoe gear.  She is tried to trim and soak it without relief and patient does not smoke and likes to be active   Review of Systems  All other systems reviewed and are negative.       Objective:  Physical Exam Vitals and nursing note reviewed.  Constitutional:      Appearance: She is well-developed.  Pulmonary:     Effort: Pulmonary effort is normal.  Musculoskeletal:        General: Normal range of motion.  Skin:    General: Skin is warm.  Neurological:     Mental Status: She is alert.     Neurovascular status intact muscle strength found to be adequate range of motion within normal limits.  Patient is found to have incurvated lateral border left hallux is painful when pressed with no drainage or redness noted.  Patient has good distal perfusion well oriented x3     Assessment:  Ingrown toenail deformity left hallux lateral border with pain but no indication of infection     Plan:  H&P condition reviewed and recommended removal of the border explained procedure risk.  Patient wants surgery and today I infiltrated the left hallux 60 mg like Marcaine mixture sterile prep applied and using sterile instrumentation remove the lateral border exposed matrix and applied phenol 3 applications 30 seconds followed by alcohol lavage and sterile dressing.  Give instructions on soaks and gave instructions on drop usage and to leave dressing on 24 hours but take it off earlier if any throbbing were to occur.  Patient is encouraged to call with questions concerns

## 2019-04-24 ENCOUNTER — Ambulatory Visit: Payer: BC Managed Care – PPO | Admitting: Podiatry

## 2019-05-01 ENCOUNTER — Ambulatory Visit (INDEPENDENT_AMBULATORY_CARE_PROVIDER_SITE_OTHER): Payer: BC Managed Care – PPO | Admitting: Podiatry

## 2019-05-01 ENCOUNTER — Encounter: Payer: Self-pay | Admitting: Podiatry

## 2019-05-01 ENCOUNTER — Other Ambulatory Visit: Payer: Self-pay

## 2019-05-01 VITALS — Temp 97.9°F

## 2019-05-01 DIAGNOSIS — L03032 Cellulitis of left toe: Secondary | ICD-10-CM

## 2019-05-04 NOTE — Progress Notes (Signed)
Subjective:   Patient ID: Jackie Brooks, female   DOB: 34 y.o.   MRN: [id number]   HPI Patient presents worried because her daughter stepped on her toe it is become red and she is concerned she may have developed an infection secondary to trauma   ROS      Objective:  Physical Exam  Neurovascular status intact with patient's left hallux showing redness in the corner with no active drainage noted but there is some low-grade irritation of the tissue with no proximal edema erythema drainage noted     Assessment:  Localized paronychia infection left hallux     Plan:  H&P reviewed condition and recommended continued conservative care consisting of soaks and bandage therapy and I reviewed x-rays if any proximal redness or active drainage were to start

## 2019-05-29 ENCOUNTER — Emergency Department (HOSPITAL_BASED_OUTPATIENT_CLINIC_OR_DEPARTMENT_OTHER)
Admit: 2019-05-29 | Discharge: 2019-05-29 | Disposition: A | Discharge status: Home / Self Care | Payer: Medicaid Other | Attending: Emergency Medicine | Admitting: Emergency Medicine

## 2019-05-29 ENCOUNTER — Other Ambulatory Visit: Payer: Self-pay

## 2019-05-29 ENCOUNTER — Emergency Department (HOSPITAL_COMMUNITY)
Admission: EM | Admit: 2019-05-29 | Discharge: 2019-05-29 | Disposition: A | Discharge status: Home / Self Care | Payer: Medicaid Other | Attending: Emergency Medicine | Admitting: Emergency Medicine

## 2019-05-29 ENCOUNTER — Encounter (HOSPITAL_COMMUNITY): Payer: Self-pay | Admitting: Emergency Medicine

## 2019-05-29 ENCOUNTER — Emergency Department (HOSPITAL_COMMUNITY): Payer: Medicaid Other

## 2019-05-29 DIAGNOSIS — R609 Edema, unspecified: Secondary | ICD-10-CM | POA: Diagnosis not present

## 2019-05-29 DIAGNOSIS — M79605 Pain in left leg: Secondary | ICD-10-CM | POA: Diagnosis not present

## 2019-05-29 DIAGNOSIS — M79662 Pain in left lower leg: Secondary | ICD-10-CM | POA: Diagnosis present

## 2019-05-29 MED ORDER — IBUPROFEN 200 MG PO TABS
600.0000 mg | ORAL_TABLET | Freq: Once | ORAL | Status: AC
  Administered 2019-05-29: 600 mg via ORAL
  Filled 2019-05-29: qty 3

## 2019-05-29 NOTE — ED Triage Notes (Signed)
Pt c/o leg tightness that started in foot and moved its way up leg. Reports that she feeling yesterday but ignored it. Then today when did some cardio, the back of her leg was "killing me". Also causing her to feel fatigued.

## 2019-05-29 NOTE — ED Notes (Signed)
Pt verbalizes understanding of DC instructions. Pt belongings returned and is ambulatory out of ED.  

## 2019-05-29 NOTE — Progress Notes (Signed)
Left lower extremity venous duplex has been completed. Preliminary results can be found in CV Proc through chart review.  Results were given to Dr. Criss Alvine.  05/29/19 3:40 PM Olen Cordial RVT

## 2019-05-29 NOTE — ED Notes (Signed)
Vascular at bedside

## 2019-05-29 NOTE — ED Provider Notes (Signed)
[facility]-EMERGENCY DEPT Provider Note   CSN: [id number] Arrival date & time: 05/29/19  1434     History Chief Complaint  Patient presents with  . Leg Pain    Jonalyn L Guillot is a 34 y.o. female.  HPI 34 year old female presents with left leg pain. Patient states it started this morning. Noticed while walking.  Feels like there is a lot of pressure in her leg and foot like it swollen but she does not think it is.  No trauma.  No numbness or weakness.  No fevers.  Has not take anything for the pain.  She is taking a weight loss supplement and is not sure if this is contributing.   Past Medical History:  Diagnosis Date  . Headache(784.0)   . Infection    UTI  . Medical history non-contributory     Patient Active Problem List   Diagnosis Date Noted  . Family history of breast cancer 03/29/2019  . Chronic right shoulder pain 12/05/2018  . Bacterial vaginitis 11/22/2018  . Stiffness of right shoulder joint 11/16/2018  . Encounter for orthopedic follow-up care 09/28/2018  . Anterior to posterior tear of superior glenoid labrum of right shoulder 09/28/2018  . Pain in joint of right shoulder 09/13/2018  . Ileus, postoperative (HCC) 12/15/2012  . Edema 12/15/2012  . Anemia 12/14/2012  . Status post primary low transverse cesarean section 12/14/2012  . Post-dates pregnancy 12/09/2012  . Late prenatal care 09/02/2012  . Migraines 09/02/2012    Past Surgical History:  Procedure Laterality Date  . CESAREAN SECTION N/A 12/11/2012   Procedure: CESAREAN SECTION;  Surgeon: Geryl Rankins, MD;  Location: [facility] ORS;  Service: Obstetrics;  Laterality: N/A;  . NO PAST SURGERIES    . SHOULDER SURGERY Right 09/28/2018     OB History    Gravida  1   Para  1   Term  1   Preterm  0   AB  0   Living  1     SAB  0   TAB  0   Ectopic  0   Multiple  0   Live Births  1           Family History  Problem Relation Age of Onset  . Cancer  Paternal Aunt        ? brain  . Cancer Paternal Grandmother        breast  . Hypertension Mother   . Diabetes Mother   . Diabetes Father     Social History   Tobacco Use  . Smoking status: Never Smoker  . Smokeless tobacco: Never Used  Substance Use Topics  . Alcohol use: No  . Drug use: No    Home Medications Prior to Admission medications   Medication Sig Start Date End Date Taking? Authorizing Provider  acetaminophen (TYLENOL 8 HOUR) 650 MG CR tablet Take 650 mg by mouth every 8 (eight) hours as needed for pain.    [provider]  Boric Acid POWD Place one suppository in the vagina every night before bed. 03/28/19   Gerrit Heck, CNM  diphenhydrAMINE (BENADRYL) 25 mg capsule Take 25 mg by mouth every 6 (six) hours as needed.    [provider]  fluconazole (DIFLUCAN) 150 MG tablet Take 1 tablet (150 mg total) by mouth daily. 03/28/19   Gerrit Heck, CNM  neomycin-polymyxin-hydrocortisone (CORTISPORIN) OTIC solution Apply 1-2 drops to toe after soaking BID 04/21/19   Lenn Sink,  DPM  famotidine (PEPCID) 20 MG tablet Take 1 tablet (20 mg total) by mouth 2 (two) times daily. Patient not taking: Reported on 09/26/2014 09/02/13 11/15/14  Noemi Chapel, MD    Allergies    Patient has no known allergies.  Review of Systems   Review of Systems  Constitutional: Negative for fever.  Cardiovascular: Positive for leg swelling.  Musculoskeletal: Positive for myalgias.  Neurological: Negative for weakness and numbness.    Physical Exam Updated Vital Signs BP 113/70 (BP Location: Right Arm)   Pulse 86   Temp 98.2 F (36.8 C) (Oral)   Resp 18   LMP 04/28/2019   SpO2 99%   Physical Exam Vitals and nursing note reviewed.  Constitutional:      Appearance: She is well-developed.  HENT:     Head: Normocephalic and atraumatic.     Right Ear: External ear normal.     Left Ear: External ear normal.     Nose: Nose normal.  Eyes:     General:        Right eye:  No discharge.        Left eye: No discharge.  Cardiovascular:     Rate and Rhythm: Normal rate and regular rhythm.     Pulses:          Dorsalis pedis pulses are 2+ on the left side.  Pulmonary:     Effort: Pulmonary effort is normal.  Abdominal:     General: There is no distension.  Musculoskeletal:     Left knee: No swelling. Normal range of motion. No tenderness.     Left lower leg: Tenderness (mild, medial calf) present.     Comments: There is perhaps some mild calf swelling on the left leg.  No skin color changes such as cellulitis/erythema.  Normal strength and sensation in left foot.  Skin:    General: Skin is warm and dry.     Comments: Left foot is warm/well perfused.  Neurological:     Mental Status: She is alert.  Psychiatric:        Mood and Affect: Mood is not anxious.     ED Results / Procedures / Treatments   Labs (all labs ordered are listed, but only abnormal results are displayed) Labs Reviewed - No data to display  EKG None  Radiology DG Tibia/Fibula Left  Result Date: 05/29/2019 CLINICAL DATA:  Left leg pain EXAM: LEFT TIBIA AND FIBULA - 2 VIEW COMPARISON:  None. FINDINGS: There is no evidence of fracture or other focal bone lesions. Soft tissues are unremarkable. IMPRESSION: Negative. Electronically Signed   By: Rolm Baptise M.D.   On: 05/29/2019 15:45   VAS [place] LOWER EXTREMITY VENOUS (DVT) ([facility] and [facility] 7a-7p)  Result Date: 05/29/2019  Lower Venous DVTStudy Indications: Edema.  Risk Factors: None identified. Comparison Study: No prior studies. Performing Technologist: Oliver Hum RVT  Examination Guidelines: A complete evaluation includes B-mode imaging, spectral Doppler, color Doppler, and power Doppler as needed of all accessible portions of each vessel. Bilateral testing is considered an integral part of a complete examination. Limited examinations for reoccurring indications may be performed as noted. The reflux portion of the exam is performed with the  patient in reverse Trendelenburg.  +-----+---------------+---------+-----------+----------+--------------+ RIGHTCompressibilityPhasicitySpontaneityPropertiesThrombus Aging +-----+---------------+---------+-----------+----------+--------------+ CFV  Full           Yes      Yes                                 +-----+---------------+---------+-----------+----------+--------------+   +---------+---------------+---------+-----------+----------+--------------+  LEFT     CompressibilityPhasicitySpontaneityPropertiesThrombus Aging +---------+---------------+---------+-----------+----------+--------------+ CFV      Full           Yes      Yes                                 +---------+---------------+---------+-----------+----------+--------------+ SFJ      Full                                                        +---------+---------------+---------+-----------+----------+--------------+ FV Prox  Full                                                        +---------+---------------+---------+-----------+----------+--------------+ FV Mid   Full                                                        +---------+---------------+---------+-----------+----------+--------------+ FV DistalFull                                                        +---------+---------------+---------+-----------+----------+--------------+ PFV      Full                                                        +---------+---------------+---------+-----------+----------+--------------+ POP      Full           Yes      Yes                                 +---------+---------------+---------+-----------+----------+--------------+ PTV      Full                                                        +---------+---------------+---------+-----------+----------+--------------+ PERO     Full                                                         +---------+---------------+---------+-----------+----------+--------------+     Summary: RIGHT: - No evidence of common femoral vein obstruction.  LEFT: - There is no evidence of deep vein thrombosis in the lower extremity.  - No cystic structure found in the popliteal fossa.  *See table(s) above for measurements and observations.  Preliminary     Procedures Procedures (including critical care time)  Medications Ordered in ED Medications  ibuprofen (ADVIL) tablet 600 mg (600 mg Oral Given 05/29/19 1550)    ED Course  I have reviewed the triage vital signs and the nursing notes.  Pertinent labs & imaging results that were available during my care of the patient were reviewed by me and considered in my medical decision making (see chart for details).    MDM Rules/Calculators/A&P                      Patient is NV intact.  DVT ultrasound negative.  X-ray unremarkable.  These were reviewed by myself.  Unclear what is causing her atraumatic calf pain but at this point I think she can be treated conservatively with NSAIDs and ice/elevation.  Follow-up with PCP if no improvement. Final Clinical Impression(s) / ED Diagnoses Final diagnoses:  Pain of left calf    Rx / DC Orders ED Discharge Orders    None       Pricilla Loveless, MD 05/29/19 1625

## 2019-05-29 NOTE — Discharge Instructions (Signed)
If you develop fever, new or worsening pain in your leg, redness or other skin color change, weakness or numbness, or any other new/concerning symptoms then return to the ER for evaluation.

## 2019-06-05 ENCOUNTER — Ambulatory Visit: Payer: BC Managed Care – PPO | Admitting: Family Medicine

## 2019-06-06 ENCOUNTER — Encounter: Payer: Self-pay | Admitting: Family Medicine

## 2019-06-15 ENCOUNTER — Ambulatory Visit: Payer: BC Managed Care – PPO | Admitting: Family Medicine

## 2019-06-16 ENCOUNTER — Ambulatory Visit: Payer: BC Managed Care – PPO | Admitting: Family Medicine

## 2019-06-21 ENCOUNTER — Ambulatory Visit: Payer: BC Managed Care – PPO | Admitting: Family Medicine

## 2019-06-22 ENCOUNTER — Encounter: Payer: Self-pay | Admitting: Family Medicine

## 2019-07-10 ENCOUNTER — Ambulatory Visit: Payer: BC Managed Care – PPO | Admitting: Podiatry

## 2019-10-02 ENCOUNTER — Ambulatory Visit (INDEPENDENT_AMBULATORY_CARE_PROVIDER_SITE_OTHER): Payer: Medicaid Other | Admitting: *Deleted

## 2019-10-02 ENCOUNTER — Other Ambulatory Visit (HOSPITAL_COMMUNITY)
Admission: RE | Admit: 2019-10-02 | Discharge: 2019-10-02 | Disposition: A | Discharge status: Home / Self Care | Payer: Medicaid Other | Source: Ambulatory Visit | Attending: Family Medicine | Admitting: Family Medicine

## 2019-10-02 ENCOUNTER — Other Ambulatory Visit: Payer: Self-pay

## 2019-10-02 DIAGNOSIS — N898 Other specified noninflammatory disorders of vagina: Secondary | ICD-10-CM | POA: Diagnosis not present

## 2019-10-02 DIAGNOSIS — Z202 Contact with and (suspected) exposure to infections with a predominantly sexual mode of transmission: Secondary | ICD-10-CM

## 2019-10-02 NOTE — Progress Notes (Signed)
Chart reviewed - agree with CMA/RN documentation.  ° °

## 2019-10-02 NOTE — Progress Notes (Signed)
Here for self swab for BV - states notices fishy odor and whitish/ yellowish vaginal discharge. She also wants to be screened for GC, Trich, and yeast .She states she could have had exposure to std. I explained to her how to do self swab. I also explained she will be contacted with + results needing treatment.  If + BV she wants to be treated with Boric acid as in February because it helped more than flagyl and metrogel. She voices understanding. Charleen Madera,RN

## 2019-10-03 ENCOUNTER — Ambulatory Visit: Payer: Medicaid Other

## 2019-10-03 LAB — CERVICOVAGINAL ANCILLARY ONLY
Bacterial Vaginitis (gardnerella): NEGATIVE
Candida Glabrata: NEGATIVE
Candida Vaginitis: NEGATIVE
Chlamydia: NEGATIVE
Comment: NEGATIVE
Comment: NEGATIVE
Comment: NEGATIVE
Comment: NEGATIVE
Comment: NEGATIVE
Comment: NORMAL
Neisseria Gonorrhea: NEGATIVE
Trichomonas: NEGATIVE

## 2019-10-05 ENCOUNTER — Telehealth: Payer: Self-pay | Admitting: *Deleted

## 2019-10-05 NOTE — Telephone Encounter (Signed)
Pt called office stating that she is still having the same previously reported symptoms from 8/9 and believes her test results are not accurate. Per chart review, pt had self swab with negative results for STI as well as yeast and BV. Pt states she is still having vaginal discharge with odor and also burning with urination. In addition, she has a burning sensation in her vagina - mostly @ night. Pt was advised that she can be seen for repeat self swab as well as urine culture.

## 2019-10-06 ENCOUNTER — Other Ambulatory Visit: Payer: Self-pay

## 2019-10-06 ENCOUNTER — Ambulatory Visit (INDEPENDENT_AMBULATORY_CARE_PROVIDER_SITE_OTHER): Payer: Medicaid Other | Admitting: *Deleted

## 2019-10-06 ENCOUNTER — Encounter: Payer: Self-pay | Admitting: *Deleted

## 2019-10-06 ENCOUNTER — Other Ambulatory Visit (HOSPITAL_COMMUNITY)
Admission: RE | Admit: 2019-10-06 | Discharge: 2019-10-06 | Disposition: A | Discharge status: Home / Self Care | Payer: Medicaid Other | Source: Ambulatory Visit | Attending: Family Medicine | Admitting: Family Medicine

## 2019-10-06 ENCOUNTER — Other Ambulatory Visit: Payer: Self-pay | Admitting: Obstetrics and Gynecology

## 2019-10-06 VITALS — BP 105/63 | HR 68 | Ht 59.0 in | Wt 144.5 lb

## 2019-10-06 DIAGNOSIS — N898 Other specified noninflammatory disorders of vagina: Secondary | ICD-10-CM | POA: Insufficient documentation

## 2019-10-06 DIAGNOSIS — R300 Dysuria: Secondary | ICD-10-CM

## 2019-10-06 LAB — POCT URINALYSIS DIP (DEVICE)
Bilirubin Urine: NEGATIVE
Glucose, UA: NEGATIVE mg/dL
Ketones, ur: NEGATIVE mg/dL
Nitrite: POSITIVE — AB
Protein, ur: NEGATIVE mg/dL
Specific Gravity, Urine: >=1.03 (ref 1.005–1.030)
Urobilinogen, UA: 0.2 mg/dL (ref 0.0–1.0)
pH: 6 (ref 5.0–8.0)

## 2019-10-06 MED ORDER — NITROFURANTOIN MONOHYD MACRO 100 MG PO CAPS: 100.0000 mg | ORAL_CAPSULE | Freq: Two times a day (BID) | ORAL | 0 refills | Status: DC

## 2019-10-06 NOTE — Progress Notes (Signed)
Pt presents with c/o burning with urination as well as continues to have vaginal discharge and odor. She had negative wet prep on 8/9 and believes the results are not correct due to her continued symptoms. Pt requested repeat testing for vaginal infection. Self swab obtained and sent to lab. Clean catch urine specimen obtained and sent for culture. Pt will be notified of test results and treatment needed if any via MyChart. She voiced understanding.

## 2019-10-06 NOTE — Progress Notes (Signed)
Macrodantin sent In for UTI sx and + nitrates and trace leukocytes. To pharmacy of pt choice.

## 2019-10-06 NOTE — Progress Notes (Signed)
Patient was assessed and managed by nursing staff during this encounter. I have reviewed the chart and agree with the documentation and plan. I have also made any necessary editorial changes.  Jaynie Collins, MD 10/06/2019 11:33 AM

## 2019-10-09 LAB — URINE CULTURE

## 2019-10-09 LAB — CERVICOVAGINAL ANCILLARY ONLY
Bacterial Vaginitis (gardnerella): NEGATIVE
Candida Glabrata: NEGATIVE
Candida Vaginitis: NEGATIVE
Comment: NEGATIVE
Comment: NEGATIVE
Comment: NEGATIVE

## 2019-10-10 ENCOUNTER — Other Ambulatory Visit: Payer: Self-pay | Admitting: Obstetrics and Gynecology

## 2019-10-13 ENCOUNTER — Other Ambulatory Visit: Payer: Self-pay

## 2019-10-13 ENCOUNTER — Encounter: Payer: Self-pay | Admitting: Family Medicine

## 2019-10-13 ENCOUNTER — Ambulatory Visit (INDEPENDENT_AMBULATORY_CARE_PROVIDER_SITE_OTHER): Payer: Medicaid Other | Admitting: Family Medicine

## 2019-10-13 VITALS — BP 111/73 | HR 80 | Temp 98.3°F | Ht 59.0 in | Wt 149.0 lb

## 2019-10-13 DIAGNOSIS — M25462 Effusion, left knee: Secondary | ICD-10-CM

## 2019-10-13 DIAGNOSIS — M25562 Pain in left knee: Secondary | ICD-10-CM

## 2019-10-13 MED ORDER — DICLOFENAC SODIUM 75 MG PO TBEC: 75.0000 mg | DELAYED_RELEASE_TABLET | Freq: Two times a day (BID) | ORAL | 0 refills | Status: DC

## 2019-10-13 NOTE — Progress Notes (Signed)
Patient ID: Jackie Brooks, female    DOB: [birth year]  Age: 34 y.o. MRN: [id number]  Chief Complaint  Patient presents with  . 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  She has been having pain over the last month the left knee which is gradually getting worse.  It swells as the day goes on.  It is painful and stiff when she tries to do things with her 13-year-old at home when she gets home.  She has tried some OTC diclofenac gel without significant relief.  She has some NSAIDs leftover from her previous shoulder surgery.  She does not know what they are.  Current allergies, medications, problem list, past/family and social histories reviewed.  Objective:  BP 111/73   Pulse 80   Temp 98.3 F (36.8 C)   Ht 4\' 11"  (1.499 m)   Wt 149 lb (67.6 kg)   LMP 10/02/2019 (Exact Date)   SpO2 99%   BMI 30.09 kg/m  Left knee has mild swelling.  There is a small palpable effusion.  No crepitance could be noted.  Neurovascular intact.  Assessment & Plan:   Assessment: 1. Acute pain of left knee   2. Effusion of left knee       Plan: See instructions.  I think it is best for her to go ahead and see an orthopedist since this is probably going to be an ongoing problem given her job.  Leave off this weekend.  Orders Placed This Encounter  Procedures  . Ambulatory referral to Orthopedic Surgery    Referral Priority:   Routine    Referral Type:   Surgical    Referral Reason:   Specialty Services Required    Referred to Provider:   12/02/2019, MD    Requested Specialty:   Orthopedic Surgery    Number of Visits Requested:   1    Meds ordered this encounter  Medications  . diclofenac (VOLTAREN) 75 MG EC tablet    Sig: Take 1 tablet (75 mg total) by mouth 2 (two)  times daily.    Dispense:  30 tablet    Refill:  0         Patient Instructions     Referral is being made to orthopedist for evaluation of your left knee.  They can do the x-ray at their facility also.  I have prescribed diclofenac 75 mg 1 twice daily for pain and inflammation.  Take at breakfast and supper.  You can continue to use the gel in your knee as needed.  In addition to this you can take Tylenol 500 mg 2 pills 3 times daily from knee pain  Wear your knee brace.  Stay off work this weekend.  You should hear from the referral sometime early in the week.  If you have lab work done today you will be contacted with your lab results within the next 2 weeks.  If you have not heard from Teryl Lucy then please contact us. The fastest way to get your results is to register for My Chart.   IF you received an x-ray today, you will receive an invoice from [facility]. Please contact [facility] at [phone] with questions or concerns regarding your invoice.   IF  you received labwork today, you will receive an invoice from [facility]. Please contact [facility] at [phone] with questions or concerns regarding your invoice.   Our billing staff will not be able to assist you with questions regarding bills from these companies.  You will be contacted with the lab results as soon as they are available. The fastest way to get your results is to activate your My Chart account. Instructions are located on the last page of this paperwork. If you have not heard from [place] regarding the results in 2 weeks, please contact this office.         Return if symptoms worsen or fail to improve.   Janace Hoard, MD 10/13/2019

## 2019-10-13 NOTE — Patient Instructions (Addendum)
° °  Referral is being made to orthopedist for evaluation of your left knee.  They can do the x-ray at their facility also.  I have prescribed diclofenac 75 mg 1 twice daily for pain and inflammation.  Take at breakfast and supper.  You can continue to use the gel in your knee as needed.  In addition to this you can take Tylenol 500 mg 2 pills 3 times daily from knee pain  Wear your knee brace.  Stay off work this weekend.  You should hear from the referral sometime early in the week.  If you have lab work done today you will be contacted with your lab results within the next 2 weeks.  If you have not heard from [place] then please contact us. The fastest way to get your results is to register for My Chart.   IF you received an x-ray today, you will receive an invoice from [facility]. Please contact [facility] at [phone] with questions or concerns regarding your invoice.   IF you received labwork today, you will receive an invoice from [facility]. Please contact [facility] at [phone] with questions or concerns regarding your invoice.   Our billing staff will not be able to assist you with questions regarding bills from these companies.  You will be contacted with the lab results as soon as they are available. The fastest way to get your results is to activate your My Chart account. Instructions are located on the last page of this paperwork. If you have not heard from [place] regarding the results in 2 weeks, please contact this office.

## 2019-10-13 NOTE — Addendum Note (Signed)
Addended by: Alm Bustard R on: 10/13/2019 04:16 PM   Modules accepted: Orders

## 2019-10-16 ENCOUNTER — Telehealth: Payer: Self-pay | Admitting: Family Medicine

## 2019-10-16 NOTE — Telephone Encounter (Signed)
Pt called and is wanting to know if she still needs go to imaging center, because she just got an appt at the orthopaedics on wed. Please advise.

## 2019-10-16 NOTE — Telephone Encounter (Signed)
I have called pt back and inform her that she is to go to [address] for [employer] or if the ortho can see the orders they maybe able to do it as well. I have left this message on her answering service.   Please let us know if she has any additional questions.

## 2019-10-16 NOTE — Telephone Encounter (Signed)
Patient is calling to ask which imaging location does Dr. Neva Seat want her to go to? She got an appt at the ortho on Wednesday and they can perform the xrays Please advise CB- [phone]

## 2019-10-18 DIAGNOSIS — M2242 Chondromalacia patellae, left knee: Secondary | ICD-10-CM | POA: Diagnosis not present

## 2019-10-24 ENCOUNTER — Telehealth: Payer: Self-pay | Admitting: Family Medicine

## 2019-10-24 NOTE — Telephone Encounter (Signed)
L/M that I am returning her call if she continues to have questions or concerns to please give the office a call back.  Addison Naegeli, RN  10/24/19

## 2019-10-24 NOTE — Telephone Encounter (Signed)
Patient state she is still having burning with urination, she was seen on 10-06-2019 and state she have finished all the medication

## 2019-10-25 ENCOUNTER — Telehealth (INDEPENDENT_AMBULATORY_CARE_PROVIDER_SITE_OTHER): Payer: Medicaid Other | Admitting: Obstetrics & Gynecology

## 2019-10-25 DIAGNOSIS — N3001 Acute cystitis with hematuria: Secondary | ICD-10-CM

## 2019-10-25 MED ORDER — CEFADROXIL 500 MG PO CAPS
500.0000 mg | ORAL_CAPSULE | Freq: Two times a day (BID) | ORAL | 0 refills | Status: AC
Start: 2019-10-25 — End: 2019-10-28

## 2019-10-25 MED ORDER — PHENAZOPYRIDINE HCL 200 MG PO TABS: 200.0000 mg | ORAL_TABLET | Freq: Three times a day (TID) | ORAL | 0 refills | Status: DC | PRN

## 2019-10-25 NOTE — Telephone Encounter (Signed)
Patient stated she never got a call back from yesterday. I informed her the nurse had indeed returned her call, and left a message stating that. She would like someone to call her ASAP. She states she needs medication.

## 2019-10-25 NOTE — Telephone Encounter (Signed)
Patient was assessed and managed by nursing staff during this encounter. I have reviewed the chart and agree with the documentation and plan. I have also made any necessary editorial changes.  Jaynie Collins, MD 10/25/2019 12:37 PM

## 2019-10-25 NOTE — Telephone Encounter (Signed)
Returned patients call. She reports she finished the Macrobid.   She reports burning with voiding, itching and urine with foul odor. She reports she sometimes has some lower side pain and some ocassional mid back pain.   Spoke with Dr. Macon Large who ordered Cefadroxil and Pyridium.   Informed patient that if she is not better on Monday to call back. Patient voiced understanding.

## 2019-11-07 DIAGNOSIS — M25562 Pain in left knee: Secondary | ICD-10-CM | POA: Diagnosis not present

## 2019-11-10 DIAGNOSIS — M2242 Chondromalacia patellae, left knee: Secondary | ICD-10-CM | POA: Diagnosis not present

## 2020-01-10 ENCOUNTER — Telehealth: Payer: Self-pay

## 2020-01-10 NOTE — Telephone Encounter (Signed)
Pt scheduled appt with NP tomorrow to address numbness on right side. Pt asked for advice asap on what to take for pain in the meantime. Please advise pt

## 2020-01-11 ENCOUNTER — Other Ambulatory Visit: Payer: Self-pay

## 2020-01-11 ENCOUNTER — Encounter: Payer: Self-pay | Admitting: Family Medicine

## 2020-01-11 ENCOUNTER — Ambulatory Visit: Payer: Medicaid Other | Admitting: Family Medicine

## 2020-01-11 VITALS — BP 117/77 | HR 85 | Temp 98.1°F | Ht 59.0 in | Wt 153.0 lb

## 2020-01-11 DIAGNOSIS — M25511 Pain in right shoulder: Secondary | ICD-10-CM | POA: Diagnosis not present

## 2020-01-11 DIAGNOSIS — G8929 Other chronic pain: Secondary | ICD-10-CM

## 2020-01-11 DIAGNOSIS — M5410 Radiculopathy, site unspecified: Secondary | ICD-10-CM

## 2020-01-11 MED ORDER — PREDNISONE 20 MG PO TABS: 20.0000 mg | ORAL_TABLET | Freq: Every day | ORAL | 0 refills | Status: AC

## 2020-01-11 MED ORDER — DICLOFENAC SODIUM 75 MG PO TBEC: 75.0000 mg | DELAYED_RELEASE_TABLET | Freq: Two times a day (BID) | ORAL | 0 refills | Status: DC

## 2020-01-11 NOTE — Progress Notes (Signed)
11/18/20215:07 PM  Jackie Brooks [birth year], 34 y.o., female [id number]  Chief Complaint  Patient presents with  . R arm , hand and shoulder numbness and weakness    on and off for 2 months - difficulty with daily tasks    HPI:   Patient is a 34 y.o. female with past medical history significant for migraines who presents today for Right sided numbness and weakness.  Right arm numb and tingling Pain shoots from shoulder to elbow and then to hand Last year had shoulder surgery Been having pain with shoulder since then The numbness has been going on for months Did physical therapy initially Progressively worse since then Pain is extreme Takes BP powder for that  Had surgery in August Still had pain 12/16/18 2nd opinion  MRI showed a small bursal sided partial-thickness supraspinatus tear with subacromial bursitis, tendinosis in the other tendons.  Per Has previously received injections from Dr. Rodney Langton at Primary care sports medicine at [facility]  Chronic right shoulder pain Status post subacromial decompression, rotator cuff debridement back in August. She is had physical therapy, persistent pain, anterior and lateral. We did subacromial and glenohumeral injections the last visit, she had only a few days of relief. Recent MRI shows partial-thickness bursal tearing of the supraspinatus, subacromial bursitis, the only thing I can think of next is a subacromial injection of PRP, we would likely do a mild tenotomy as well. Before we do this I would like to have her get a second surgical opinion from Dr. Everardo Pacific, she has her MRI disc for him to review.  Never saw Dr. Everardo Pacific and never followed up with Dr. Hurshel Party   Depression screen [facility] 2/9 01/11/2020 10/13/2019 10/02/2019  Decreased Interest 0 0 0  Down, Depressed, Hopeless 0 0 0  PHQ - 2 Score 0 0 0  Altered sleeping - 0 0  Tired, decreased energy - 0 0  Change in appetite - 0 0   Feeling bad or failure about yourself  - 0 0  Trouble concentrating - 0 0  Moving slowly or fidgety/restless - 0 0  Suicidal thoughts - 0 0  PHQ-9 Score - 0 0  Difficult doing work/chores - - -    Fall Risk  01/11/2020 10/13/2019 03/30/2019 11/28/2018 11/22/2018  Falls in the past year? 0 0 1 1 0  Number falls in past yr: 0 0 0 0 0  Injury with Fall? 0 0 0 1 0  Risk for fall due to : - - - History of fall(s) -  Follow up Falls evaluation completed Falls evaluation completed - Falls evaluation completed -     No Known Allergies  Prior to Admission medications   Medication Sig Start Date End Date Taking? Authorizing Provider  Aspirin-Caffeine 845-65 MG PACK Take by mouth.   Yes [provider]  acetaminophen (TYLENOL 8 HOUR) 650 MG CR tablet Take 650 mg by mouth every 8 (eight) hours as needed for pain.  Patient not taking: Reported on 01/11/2020    [provider]  Boric Acid POWD Place one suppository in the vagina every night before bed. Patient not taking: Reported on 01/11/2020 03/28/19   Gerrit Heck, CNM  diclofenac (VOLTAREN) 75 MG EC tablet Take 1 tablet (75 mg total) by mouth 2 (two) times daily. Patient not taking: Reported on 01/11/2020 10/13/19   Peyton Najjar, MD  famotidine (PEPCID) 20 MG tablet Take 1 tablet (20 mg total) by mouth 2 (two)  times daily. Patient not taking: Reported on 09/26/2014 09/02/13 11/15/14  Eber Hong, MD    Past Medical History:  Diagnosis Date  . Headache(784.0)   . Infection    UTI  . Medical history non-contributory     Past Surgical History:  Procedure Laterality Date  . CESAREAN SECTION N/A 12/11/2012   Procedure: CESAREAN SECTION;  Surgeon: Geryl Rankins, MD;  Location: [facility];  Service: Obstetrics;  Laterality: N/A;  . NO PAST SURGERIES    . SHOULDER SURGERY Right 09/28/2018    Social History   Tobacco Use  . Smoking status: Never Smoker  . Smokeless tobacco: Never Used  Substance Use Topics  . Alcohol  use: No    Family History  Problem Relation Age of Onset  . Cancer Paternal Aunt        ? brain  . Cancer Paternal Grandmother        breast  . Hypertension Mother   . Diabetes Mother   . Diabetes Father     Review of Systems  Constitutional: Negative for chills, fever and malaise/fatigue.  Respiratory: Negative for shortness of breath.   Cardiovascular: Negative for chest pain.  Musculoskeletal: Positive for joint pain (shoulder). Negative for back pain and neck pain.  Neurological: Positive for tingling (Right hand and arm). Negative for focal weakness, weakness and headaches.     OBJECTIVE:  Today's Vitals   01/11/20 1612  BP: 117/77  Pulse: 85  Temp: 98.1 F (36.7 C)  SpO2: 98%  Weight: 153 lb (69.4 kg)  Height: 4\' 11"  (1.499 m)   Body mass index is 30.9 kg/m.   Physical Exam Constitutional:      General: She is not in acute distress.    Appearance: Normal appearance. She is not ill-appearing.  Pulmonary:     Effort: Pulmonary effort is normal. No respiratory distress.  Musculoskeletal:     Right shoulder: Tenderness present. No swelling or deformity. Normal range of motion. Normal strength. Normal pulse.     Left shoulder: No swelling, deformity or tenderness. Normal range of motion. Normal strength. Normal pulse.     Right hand: Normal.     Left hand: Normal.     Cervical back: Normal range of motion. No tenderness.  Neurological:     Mental Status: She is alert and oriented to person, place, and time.  Psychiatric:        Mood and Affect: Mood normal.     No results found for this or any previous visit (from the past 24 hour(s)).  No results found.   ASSESSMENT and PLAN  Problem List Items Addressed This Visit      Other   Chronic right shoulder pain - Primary   Relevant Medications   diclofenac (VOLTAREN) 75 MG EC tablet bid   predniSONE (DELTASONE) 20 MG tablet daily in the AM Discussed r/se/b of medication   Other Relevant Orders    Ambulatory referral to Physical Therapy   AMB referral to orthopedics : Dr. for 2nd opinion Previous injections from Dr. Everardo Pacific at Primary care sports medicine at [facility] [place], contact him to set up a follow up appointment [phone]         Return in about 3 months (around 04/12/2020).    04/14/2020 Tammi Boulier, FNP-BC Primary Care at [facility] [address] [place], [place] Ph.  [phone] Fax [phone]

## 2020-01-11 NOTE — Patient Instructions (Addendum)
Referral send to Dr. Everardo Pacific for 2nd surgical opinion  Previous injections from Dr. Rodney Langton at Primary care sports medicine at [place] [facility], contact him to set up a follow up appointment [phone]  Referral sent to physical therapy    Shoulder Pain Many things can cause shoulder pain, including:  An injury.  Moving the shoulder in the same way again and again (overuse).  Joint pain (arthritis). Pain can come from:  Swelling and irritation (inflammation) of any part of the shoulder.  An injury to the shoulder joint.  An injury to: ? Tissues that connect muscle to bone (tendons). ? Tissues that connect bones to each other (ligaments). ? Bones. Follow these instructions at home: Watch for changes in your symptoms. Let your doctor know about them. Follow these instructions to help with your pain. If you have a sling:  Wear the sling as told by your doctor. Remove it only as told by your doctor.  Loosen the sling if your fingers: ? Tingle. ? Become numb. ? Turn cold and blue.  Keep the sling clean.  If the sling is not waterproof: ? Do not let it get wet. ? Take the sling off when you shower or bathe. Managing pain, stiffness, and swelling   If told, put ice on the painful area: ? Put ice in a plastic bag. ? Place a towel between your skin and the bag. ? Leave the ice on for 20 minutes, 2-3 times a day. Stop putting ice on if it does not help with the pain.  Squeeze a soft ball or a foam pad as much as possible. This prevents swelling in the shoulder. It also helps to strengthen the arm. General instructions  Take over-the-counter and prescription medicines only as told by your doctor.  Keep all follow-up visits as told by your doctor. This is important. Contact a doctor if:  Your pain gets worse.  Medicine does not help your pain.  You have new pain in your arm, hand, or fingers. Get help right away if:  Your arm,  hand, or fingers: ? Tingle. ? Are numb. ? Are swollen. ? Are painful. ? Turn white or blue. Summary  Shoulder pain can be caused by many things. These include injury, moving the shoulder in the same away again and again, and joint pain.  Watch for changes in your symptoms. Let your doctor know about them.  This condition may be treated with a sling, ice, and pain medicine.  Contact your doctor if the pain gets worse or you have new pain. Get help right away if your arm, hand, or fingers tingle or get numb, swollen, or painful.  Keep all follow-up visits as told by your doctor. This is important. This information is not intended to replace advice given to you by your health care provider. Make sure you discuss any questions you have with your health care provider. Document Revised: 08/24/2017 Document Reviewed: 08/24/2017 Elsevier Patient Education  [employer].     If you have lab work done today you will be contacted with your lab results within the next 2 weeks.  If you have not heard from [place] then please contact us. The fastest way to get your results is to register for My Chart.   IF you received an x-ray today, you will receive an invoice from [facility]. Please contact [facility] at [phone] with questions or concerns regarding your invoice.   IF you received labwork today,  you will receive an invoice from [place]. Please contact [facility] at [phone] with questions or concerns regarding your invoice.   Our billing staff will not be able to assist you with questions regarding bills from these companies.  You will be contacted with the lab results as soon as they are available. The fastest way to get your results is to activate your My Chart account. Instructions are located on the last page of this paperwork. If you have not heard from [place] regarding the results in 2 weeks, please contact this office.

## 2020-01-16 ENCOUNTER — Other Ambulatory Visit: Payer: Self-pay

## 2020-01-16 ENCOUNTER — Other Ambulatory Visit (HOSPITAL_COMMUNITY)
Admission: RE | Admit: 2020-01-16 | Discharge: 2020-01-16 | Disposition: A | Discharge status: Home / Self Care | Payer: Medicaid Other | Source: Ambulatory Visit | Attending: Family Medicine | Admitting: Family Medicine

## 2020-01-16 ENCOUNTER — Encounter: Payer: Self-pay | Admitting: *Deleted

## 2020-01-16 ENCOUNTER — Ambulatory Visit (INDEPENDENT_AMBULATORY_CARE_PROVIDER_SITE_OTHER): Payer: Medicaid Other | Admitting: *Deleted

## 2020-01-16 VITALS — BP 116/86 | HR 83 | Ht 59.0 in | Wt 155.0 lb

## 2020-01-16 DIAGNOSIS — N898 Other specified noninflammatory disorders of vagina: Secondary | ICD-10-CM | POA: Diagnosis not present

## 2020-01-16 DIAGNOSIS — R8290 Unspecified abnormal findings in urine: Secondary | ICD-10-CM

## 2020-01-16 LAB — POCT URINALYSIS DIP (DEVICE)
Bilirubin Urine: NEGATIVE
Glucose, UA: NEGATIVE mg/dL
Hgb urine dipstick: NEGATIVE
Ketones, ur: NEGATIVE mg/dL
Leukocytes,Ua: NEGATIVE
Nitrite: POSITIVE — AB
Protein, ur: NEGATIVE mg/dL
Specific Gravity, Urine: 1.02 (ref 1.005–1.030)
Urobilinogen, UA: 0.2 mg/dL (ref 0.0–1.0)
pH: 7 (ref 5.0–8.0)

## 2020-01-16 MED ORDER — NITROFURANTOIN MONOHYD MACRO 100 MG PO CAPS: 100.0000 mg | ORAL_CAPSULE | Freq: Two times a day (BID) | ORAL | 0 refills | Status: DC

## 2020-01-16 MED ORDER — FLUCONAZOLE 150 MG PO TABS: 150.0000 mg | ORAL_TABLET | Freq: Once | ORAL | 0 refills | Status: AC

## 2020-01-16 NOTE — Progress Notes (Signed)
Pt presents with c/o urine mal odor, vaginal irritation and odor. She states these are the same symptoms she had previously. Per chart review, she was treated for UTI on 8/13 with Macrobid, then treated with Cefadroxil and Pyridium on 9/1 for c/o persistent sx (urine cx was not repeated). Urinalysis performed in office today and shows positive Nitrites. Results and pt status discussed w/Dr. Germaine Pomfret. Rx for Macrobid sent to pharmacy. Pt stated that she frequently gets vaginal yeast following antibiotic treatment and therefore Rx for fluconazole was sent in per standing order. Self swab was obtained for wet prep and STI testing. Pt was advised that she will receive test results as well as any additional treatment information via MyChart. She was also advised of recommendation for future appt w/provider due to recurrent UTI. Measures to reduce incidence of UTI were discussed. Pt voiced understanding of all information and instructions given. She agreed to plan of care.

## 2020-01-17 LAB — CERVICOVAGINAL ANCILLARY ONLY
Bacterial Vaginitis (gardnerella): NEGATIVE
Candida Glabrata: NEGATIVE
Candida Vaginitis: NEGATIVE
Chlamydia: NEGATIVE
Comment: NEGATIVE
Comment: NEGATIVE
Comment: NEGATIVE
Comment: NEGATIVE
Comment: NEGATIVE
Comment: NORMAL
Neisseria Gonorrhea: NEGATIVE
Trichomonas: NEGATIVE

## 2020-01-19 LAB — URINE CULTURE

## 2020-02-02 ENCOUNTER — Telehealth: Payer: Medicaid Other | Admitting: Nurse Practitioner

## 2020-02-02 DIAGNOSIS — R399 Unspecified symptoms and signs involving the genitourinary system: Secondary | ICD-10-CM

## 2020-02-02 DIAGNOSIS — M545 Low back pain, unspecified: Secondary | ICD-10-CM

## 2020-02-02 NOTE — Progress Notes (Signed)
Based on what you shared with me it looks like you have uti symptoms with back pain,that should be evaluated in a face to face office visit. Due to the associating back pain you will need a urinalysis and urine culture for proper treatment.    NOTE: If you entered your credit card information for this eVisit, you will not be charged. You may see a "hold" on your card for the $35 but that hold will drop off and you will not have a charge processed.  If you are having a true medical emergency please call 911.     For an urgent face to face visit, [facility] has four urgent care centers for your convenience:   . [facility]    [phone]                  Get Driving Directions  [address] [place], [place] [address] . 10 am to 8 pm Monday-Friday . 12 pm to 8 pm Saturday-Sunday   . [facility]  [phone]                  Get Driving Directions  [address] [place], [place] [address] . 8 am to 8 pm Monday-Friday . 9 am to 6 pm Saturday . 11 am to 6 pm Sunday   . [facility] [place]  [phone]                  Get Driving Directions   [address] [place], [place] [address] . 8 am to 8 pm Monday-Friday . 8 am to 4 pm Saturday-Sunday    . [facility] [place]                    Get Driving Directions  [phone]  [address] [place], [place] [address]  . Monday-Friday, 12 PM to 6 PM    Your e-visit answers were reviewed by a board certified advanced clinical practitioner to complete your personal care plan.  Thank you for using e-Visits.  

## 2020-02-05 ENCOUNTER — Ambulatory Visit: Payer: Medicaid Other

## 2020-02-06 ENCOUNTER — Encounter: Payer: Self-pay | Admitting: *Deleted

## 2020-02-06 ENCOUNTER — Ambulatory Visit (INDEPENDENT_AMBULATORY_CARE_PROVIDER_SITE_OTHER): Payer: Medicaid Other | Admitting: *Deleted

## 2020-02-06 ENCOUNTER — Other Ambulatory Visit (HOSPITAL_COMMUNITY)
Admission: RE | Admit: 2020-02-06 | Discharge: 2020-02-06 | Disposition: A | Discharge status: Home / Self Care | Payer: Medicaid Other | Source: Ambulatory Visit | Attending: Medical | Admitting: Medical

## 2020-02-06 ENCOUNTER — Other Ambulatory Visit: Payer: Self-pay

## 2020-02-06 VITALS — BP 116/74 | HR 78 | Ht 59.0 in | Wt 153.0 lb

## 2020-02-06 DIAGNOSIS — N898 Other specified noninflammatory disorders of vagina: Secondary | ICD-10-CM

## 2020-02-06 DIAGNOSIS — M549 Dorsalgia, unspecified: Secondary | ICD-10-CM | POA: Diagnosis not present

## 2020-02-06 DIAGNOSIS — R8290 Unspecified abnormal findings in urine: Secondary | ICD-10-CM

## 2020-02-06 DIAGNOSIS — Z87440 Personal history of urinary (tract) infections: Secondary | ICD-10-CM

## 2020-02-06 LAB — POCT URINALYSIS DIP (DEVICE)
Bilirubin Urine: NEGATIVE
Glucose, UA: NEGATIVE mg/dL
Hgb urine dipstick: NEGATIVE
Ketones, ur: NEGATIVE mg/dL
Leukocytes,Ua: NEGATIVE
Nitrite: NEGATIVE
Protein, ur: NEGATIVE mg/dL
Specific Gravity, Urine: >=1.03 (ref 1.005–1.030)
Urobilinogen, UA: 0.2 mg/dL (ref 0.0–1.0)
pH: 6 (ref 5.0–8.0)

## 2020-02-06 NOTE — Progress Notes (Signed)
Pt states she had recent e-visit on 12/10 and was told she needed evaluation for possible UTI. She was recently treated for UTI from this office w/macrobid on 01/16/20. She reports continued Rt sided back pain x1 month. She had taken the fluconazole for vaginal itching after the Macrobid completion. After taking the fluconazole she developed increased vaginal itching, drainage and urine malodor. Urinalysis today is negative for sign of infection. Due to recent UTI and pt continued sx, urine culture was sent. Pt performed self swab for wet prep and STI. She was advised that she will receive notification of test results as well as treatment if indicated via Mychart. Pt voiced understanding. Per chart review, pt has an appointment scheduled in office on 02/13/20 for evaluation of recurrent UTI.

## 2020-02-07 LAB — CERVICOVAGINAL ANCILLARY ONLY
Bacterial Vaginitis (gardnerella): NEGATIVE
Candida Glabrata: NEGATIVE
Candida Vaginitis: NEGATIVE
Chlamydia: NEGATIVE
Comment: NEGATIVE
Comment: NEGATIVE
Comment: NEGATIVE
Comment: NEGATIVE
Comment: NEGATIVE
Comment: NORMAL
Neisseria Gonorrhea: NEGATIVE
Trichomonas: NEGATIVE

## 2020-02-08 LAB — URINE CULTURE: Organism ID, Bacteria: NO GROWTH

## 2020-02-13 ENCOUNTER — Ambulatory Visit: Payer: Medicaid Other | Admitting: Advanced Practice Midwife

## 2020-02-19 ENCOUNTER — Telehealth: Payer: Self-pay | Admitting: Obstetrics & Gynecology

## 2020-02-19 ENCOUNTER — Telehealth: Payer: Self-pay | Admitting: Family Medicine

## 2020-02-19 NOTE — Telephone Encounter (Signed)
Pt calling in and states that she needs to come in ASAP for UTI pain.  Pt missed appt 02-13-20(pt states not aware, but pt was informed at last visit) thanks

## 2020-02-19 NOTE — Telephone Encounter (Signed)
Second time patient has called today. She wants something called in for the itching, and urine smell she is having.

## 2020-02-20 ENCOUNTER — Other Ambulatory Visit (HOSPITAL_COMMUNITY)
Admission: RE | Admit: 2020-02-20 | Discharge: 2020-02-20 | Disposition: A | Discharge status: Home / Self Care | Payer: Medicaid Other | Source: Ambulatory Visit | Attending: Family Medicine | Admitting: Family Medicine

## 2020-02-20 ENCOUNTER — Other Ambulatory Visit: Payer: Self-pay

## 2020-02-20 ENCOUNTER — Ambulatory Visit (INDEPENDENT_AMBULATORY_CARE_PROVIDER_SITE_OTHER): Payer: Medicaid Other

## 2020-02-20 VITALS — BP 110/67 | HR 88 | Wt 158.0 lb

## 2020-02-20 DIAGNOSIS — N898 Other specified noninflammatory disorders of vagina: Secondary | ICD-10-CM | POA: Insufficient documentation

## 2020-02-20 DIAGNOSIS — R8290 Unspecified abnormal findings in urine: Secondary | ICD-10-CM | POA: Diagnosis not present

## 2020-02-20 LAB — POCT URINALYSIS DIP (DEVICE)
Bilirubin Urine: NEGATIVE
Glucose, UA: NEGATIVE mg/dL
Hgb urine dipstick: NEGATIVE
Ketones, ur: NEGATIVE mg/dL
Nitrite: POSITIVE — AB
Protein, ur: NEGATIVE mg/dL
Specific Gravity, Urine: 1.02 (ref 1.005–1.030)
Urobilinogen, UA: 0.2 mg/dL (ref 0.0–1.0)
pH: 6 (ref 5.0–8.0)

## 2020-02-20 NOTE — Telephone Encounter (Signed)
Called and spoke with patient. She has urine smell and irritation. She has an appointment at 1:30 today.

## 2020-02-20 NOTE — Progress Notes (Signed)
Pt here today for questionable UTI.  Pt reports that she is continuing to have the bilateral side pain and vaginal irritation that she had went she came in on 02/06/20.  I explained to the pt that we can do a self swab and because her UA came back resulting nitrates and leukocytes then I can send for urine cx.  I informed pt that we would recommend not treating for a UTI unless something resulted on urine cx.  I also informed pt that she will get results on MyChart, it takes up to three days for urine cx to result, and that we would call her with abnormal results keeping in mind that we are closed for New Years Eve.  Pt explained how to obtain self swab.  Pt verbalized understanding to all that was discussed.    Addison Naegeli, RN  02/20/20

## 2020-02-21 LAB — CERVICOVAGINAL ANCILLARY ONLY
Bacterial Vaginitis (gardnerella): NEGATIVE
Candida Glabrata: NEGATIVE
Candida Vaginitis: NEGATIVE
Chlamydia: NEGATIVE
Comment: NEGATIVE
Comment: NEGATIVE
Comment: NEGATIVE
Comment: NEGATIVE
Comment: NEGATIVE
Comment: NORMAL
Neisseria Gonorrhea: NEGATIVE
Trichomonas: NEGATIVE

## 2020-02-22 ENCOUNTER — Telehealth: Payer: Self-pay | Admitting: Family Medicine

## 2020-02-22 MED ORDER — PHENAZOPYRIDINE HCL 200 MG PO TABS: 200.0000 mg | ORAL_TABLET | Freq: Three times a day (TID) | ORAL | 0 refills | Status: DC | PRN

## 2020-02-22 MED ORDER — FLUCONAZOLE 150 MG PO TABS: 150.0000 mg | ORAL_TABLET | Freq: Once | ORAL | 0 refills | Status: AC

## 2020-02-22 MED ORDER — SULFAMETHOXAZOLE-TRIMETHOPRIM 800-160 MG PO TABS: 1.0000 | ORAL_TABLET | Freq: Two times a day (BID) | ORAL | 0 refills | Status: DC

## 2020-02-22 NOTE — Telephone Encounter (Signed)
Chart reviewed - agree with RN documentation.   

## 2020-02-22 NOTE — Telephone Encounter (Signed)
Returned patients call. She reports she is having pain and UTI symptoms and would like ATB treatment. Reviewed with Dr. Adrian Blackwater who advised Bactrim, Pyridium, and Diflucan post ATB. Orders sent. Patient notified.

## 2020-02-22 NOTE — Telephone Encounter (Signed)
Patient called in not in the best mood, she said she received a message in her Mychart regarding her Test Results, patient said she have not had a RX called in.  She is very adamant about getting a RX called in.

## 2020-02-24 LAB — URINE CULTURE

## 2020-02-26 ENCOUNTER — Ambulatory Visit: Payer: Medicaid Other | Admitting: Registered Nurse

## 2020-02-26 ENCOUNTER — Ambulatory Visit: Payer: Medicaid Other

## 2020-04-03 ENCOUNTER — Ambulatory Visit (INDEPENDENT_AMBULATORY_CARE_PROVIDER_SITE_OTHER): Payer: Medicaid Other | Admitting: *Deleted

## 2020-04-03 ENCOUNTER — Other Ambulatory Visit (HOSPITAL_COMMUNITY)
Admission: RE | Admit: 2020-04-03 | Discharge: 2020-04-03 | Disposition: A | Discharge status: Home / Self Care | Payer: Medicaid Other | Source: Ambulatory Visit | Attending: Obstetrics and Gynecology | Admitting: Obstetrics and Gynecology

## 2020-04-03 ENCOUNTER — Other Ambulatory Visit: Payer: Self-pay

## 2020-04-03 VITALS — BP 111/70 | HR 80 | Ht 59.0 in | Wt 157.7 lb

## 2020-04-03 DIAGNOSIS — M25511 Pain in right shoulder: Secondary | ICD-10-CM | POA: Diagnosis not present

## 2020-04-03 DIAGNOSIS — N390 Urinary tract infection, site not specified: Secondary | ICD-10-CM | POA: Diagnosis not present

## 2020-04-03 DIAGNOSIS — G8929 Other chronic pain: Secondary | ICD-10-CM | POA: Diagnosis not present

## 2020-04-03 DIAGNOSIS — Z113 Encounter for screening for infections with a predominantly sexual mode of transmission: Secondary | ICD-10-CM

## 2020-04-03 DIAGNOSIS — R319 Hematuria, unspecified: Secondary | ICD-10-CM | POA: Diagnosis not present

## 2020-04-03 DIAGNOSIS — R300 Dysuria: Secondary | ICD-10-CM | POA: Diagnosis not present

## 2020-04-03 LAB — POCT URINALYSIS DIP (DEVICE)
Bilirubin Urine: NEGATIVE
Glucose, UA: NEGATIVE mg/dL
Ketones, ur: NEGATIVE mg/dL
Nitrite: POSITIVE — AB
Protein, ur: NEGATIVE mg/dL
Specific Gravity, Urine: >=1.03 (ref 1.005–1.030)
Urobilinogen, UA: 0.2 mg/dL (ref 0.0–1.0)
pH: 5.5 (ref 5.0–8.0)

## 2020-04-03 MED ORDER — NITROFURANTOIN MONOHYD MACRO 100 MG PO CAPS: 100.0000 mg | ORAL_CAPSULE | Freq: Two times a day (BID) | ORAL | 0 refills | Status: AC

## 2020-04-03 MED ORDER — PHENAZOPYRIDINE HCL 200 MG PO TABS: 200.0000 mg | ORAL_TABLET | Freq: Three times a day (TID) | ORAL | 0 refills | Status: AC

## 2020-04-03 NOTE — Progress Notes (Signed)
Here for uti testing. C/o burning with urination and strong smell of urine. Also wants to do self swab for std. Urine + for Nitrites. Sent urine for  Culture. Sent in Rx for macrobid and Pyridium. Explained may also have new RX depending on results of culture. C/o frequent uti's. Will forward to Md.  Instructed in self swab and will check for GC, Trich per request. Dorothy Polhemus,Rn

## 2020-04-04 LAB — CERVICOVAGINAL ANCILLARY ONLY
Chlamydia: NEGATIVE
Comment: NEGATIVE
Comment: NEGATIVE
Comment: NORMAL
Neisseria Gonorrhea: NEGATIVE
Trichomonas: NEGATIVE

## 2020-04-04 NOTE — Progress Notes (Signed)
Patient was assessed and managed by nursing staff during this encounter. I have reviewed the chart and agree with the documentation and plan. I have also made any necessary editorial changes.  Lake Dal[place]arlia Bing, MD 04/04/2020 10:05 PM

## 2020-04-05 LAB — URINE CULTURE

## 2020-04-16 ENCOUNTER — Telehealth: Payer: Self-pay | Admitting: Obstetrics and Gynecology

## 2020-04-16 NOTE — Telephone Encounter (Signed)
Patient called to say the medication given to her did not work and she would like something else.

## 2020-04-17 MED ORDER — FLUCONAZOLE 150 MG PO TABS: 150.0000 mg | ORAL_TABLET | Freq: Once | ORAL | 0 refills | Status: AC

## 2020-04-17 NOTE — Telephone Encounter (Signed)
Returned patients call. Patient reports she is having continued symptoms of UTI. She finished the Macrobid and 2 days later the symptoms resumed.   She is having pain in her stomach and odor to urine. She is not having burning. She is have urinary frequency.   She is having itching to vagina. She is having some cloudy discharge from the vagina, she denies a smell from the vagina. Diflucan sent to Pharmacy per standing order.   She is asking about getting a new Antibiotic and a Urology consult for frequent UTI and Symptoms.    Will forward to Dr. Vergie Living for advisement.

## 2020-04-17 NOTE — Telephone Encounter (Signed)
Patient called again this morning about medication

## 2020-04-18 ENCOUNTER — Other Ambulatory Visit: Payer: Self-pay

## 2020-04-18 ENCOUNTER — Telehealth: Payer: Self-pay | Admitting: Family Medicine

## 2020-04-18 ENCOUNTER — Ambulatory Visit (INDEPENDENT_AMBULATORY_CARE_PROVIDER_SITE_OTHER): Payer: Medicaid Other

## 2020-04-18 DIAGNOSIS — R319 Hematuria, unspecified: Secondary | ICD-10-CM | POA: Diagnosis not present

## 2020-04-18 DIAGNOSIS — R8290 Unspecified abnormal findings in urine: Secondary | ICD-10-CM

## 2020-04-18 DIAGNOSIS — N390 Urinary tract infection, site not specified: Secondary | ICD-10-CM

## 2020-04-18 LAB — POCT URINALYSIS DIP (DEVICE)
Bilirubin Urine: NEGATIVE
Glucose, UA: NEGATIVE mg/dL
Ketones, ur: NEGATIVE mg/dL
Nitrite: POSITIVE — AB
Protein, ur: NEGATIVE mg/dL
Specific Gravity, Urine: >=1.03 (ref 1.005–1.030)
Urobilinogen, UA: 0.2 mg/dL (ref 0.0–1.0)
pH: 6.5 (ref 5.0–8.0)

## 2020-04-18 MED ORDER — NITROFURANTOIN MONOHYD MACRO 100 MG PO CAPS: 100.0000 mg | ORAL_CAPSULE | Freq: Two times a day (BID) | ORAL | 0 refills | Status: DC

## 2020-04-18 MED ORDER — PHENAZOPYRIDINE HCL 200 MG PO TABS: 200.0000 mg | ORAL_TABLET | Freq: Three times a day (TID) | ORAL | 0 refills | Status: DC | PRN

## 2020-04-18 NOTE — Telephone Encounter (Signed)
Called patient and gave her information that was conveyed by Dr. Vergie Living. Patient does not wish to have to come back up to the office. She reports she is at work and in pain. She asked what available appointments we have today to be checked.  Offered that she can come in before 4 and drop off her urine and it can be run and the nurse can call her with results and recommendations. Patient reports she will be here around 3:30 and will most likely stay for results. Addison Naegeli, RN agreed.

## 2020-04-18 NOTE — Progress Notes (Signed)
Pt left urine for questionable UTI.  UA resulted trace blood, + nitrate, and mod leukocytes.  Pt called and informed that her UA came back abnormal and that I would send her urine for cx.  Pt reports that she does not understand why she keeps getting them.  I advised pt that it can come from diet, not drinking enough water, and not cleansing after intercourse to name some.  Pt reports that she has some pain with urination.  Pt advised that per standard protocol Macrobid 100 mg po bid x 5 days, Pyridium 200 mg po tid for two days has been sent to her [facility] pharmacy in which if the urine cx comes back with the need to change antibiotic therapy then we will give her a call.  Pt asked if she was going to get referral about this issue.  I informed pt that per chart review provider did recommend referral to Urology.  I advised pt that I would reach out to [facility] for referral and send her a MyChart message.  Pt verbalized understanding.  Amb ref placed for Urology.  [facility] scheduled for March 28th @ 1345.  Appt info sent to pt via MyChart.   Addison Naegeli, RN  04/18/20 04/18/20

## 2020-04-18 NOTE — Telephone Encounter (Signed)
Tell her I recommend she come in for a repeat u-dip and urine culture. If the u-dip is suspicious for another UTI, we can try another antibiotic and set her up with a Urology consult. If the u-dip looks clean, then we can wait on the ucx results before doing anything else.   thanks

## 2020-04-21 LAB — URINE CULTURE

## 2020-05-20 DIAGNOSIS — R3121 Asymptomatic microscopic hematuria: Secondary | ICD-10-CM | POA: Diagnosis not present

## 2020-05-20 DIAGNOSIS — R8271 Bacteriuria: Secondary | ICD-10-CM | POA: Diagnosis not present

## 2020-07-10 DIAGNOSIS — R8271 Bacteriuria: Secondary | ICD-10-CM | POA: Diagnosis not present

## 2020-07-10 DIAGNOSIS — B373 Candidiasis of vulva and vagina: Secondary | ICD-10-CM | POA: Diagnosis not present

## 2020-07-10 DIAGNOSIS — R3121 Asymptomatic microscopic hematuria: Secondary | ICD-10-CM | POA: Diagnosis not present

## 2020-07-19 ENCOUNTER — Ambulatory Visit: Payer: Medicaid Other

## 2020-07-23 ENCOUNTER — Ambulatory Visit (INDEPENDENT_AMBULATORY_CARE_PROVIDER_SITE_OTHER): Payer: Medicaid Other

## 2020-07-23 ENCOUNTER — Other Ambulatory Visit (HOSPITAL_COMMUNITY)
Admission: RE | Admit: 2020-07-23 | Discharge: 2020-07-23 | Disposition: A | Discharge status: Home / Self Care | Payer: Medicaid Other | Source: Ambulatory Visit | Attending: Family Medicine | Admitting: Family Medicine

## 2020-07-23 ENCOUNTER — Other Ambulatory Visit: Payer: Self-pay

## 2020-07-23 VITALS — BP 110/75 | HR 78 | Wt 164.2 lb

## 2020-07-23 DIAGNOSIS — Z113 Encounter for screening for infections with a predominantly sexual mode of transmission: Secondary | ICD-10-CM | POA: Insufficient documentation

## 2020-07-23 NOTE — Progress Notes (Signed)
Pt here today for STD screening as she is requesting to be checked for all STD's including HIV, RPR, Hep B, and Hep C.  Pt explained how to obtain self swab and that it will take 24-48 hours for results in which we will call with abnormal results.  Pt verbalized understanding with no further questions.   Jackie Brooks  07/23/20

## 2020-07-24 LAB — CERVICOVAGINAL ANCILLARY ONLY
Bacterial Vaginitis (gardnerella): NEGATIVE
Candida Glabrata: NEGATIVE
Candida Vaginitis: NEGATIVE
Chlamydia: NEGATIVE
Comment: NEGATIVE
Comment: NEGATIVE
Comment: NEGATIVE
Comment: NEGATIVE
Comment: NEGATIVE
Comment: NORMAL
Neisseria Gonorrhea: NEGATIVE
Trichomonas: NEGATIVE

## 2020-07-24 LAB — HIV ANTIBODY (ROUTINE TESTING W REFLEX): HIV Screen 4th Generation wRfx: NONREACTIVE

## 2020-07-24 LAB — HEPATITIS C ANTIBODY: Hep C Virus Ab: 0.1 s/co ratio (ref 0.0–0.9)

## 2020-07-24 LAB — HEPATITIS B SURFACE ANTIGEN: Hepatitis B Surface Ag: NEGATIVE

## 2020-07-24 LAB — RPR: RPR Ser Ql: NONREACTIVE

## 2020-07-24 NOTE — Progress Notes (Signed)
Patient was assessed and managed by nursing staff during this encounter. I have reviewed the chart and agree with the documentation and plan.   Arron Mcnaught R Pilar Corrales, CNM 07/24/2020 4:49 PM   

## 2020-10-16 ENCOUNTER — Emergency Department (HOSPITAL_COMMUNITY)
Admission: EM | Admit: 2020-10-16 | Discharge: 2020-10-16 | Disposition: A | Discharge status: Home / Self Care | Payer: Medicaid Other | Attending: Emergency Medicine | Admitting: Emergency Medicine

## 2020-10-16 ENCOUNTER — Emergency Department (HOSPITAL_COMMUNITY): Payer: Medicaid Other

## 2020-10-16 DIAGNOSIS — K429 Umbilical hernia without obstruction or gangrene: Secondary | ICD-10-CM | POA: Diagnosis not present

## 2020-10-16 DIAGNOSIS — K760 Fatty (change of) liver, not elsewhere classified: Secondary | ICD-10-CM | POA: Diagnosis not present

## 2020-10-16 DIAGNOSIS — Z7982 Long term (current) use of aspirin: Secondary | ICD-10-CM | POA: Insufficient documentation

## 2020-10-16 DIAGNOSIS — R109 Unspecified abdominal pain: Secondary | ICD-10-CM | POA: Diagnosis not present

## 2020-10-16 DIAGNOSIS — R1033 Periumbilical pain: Secondary | ICD-10-CM | POA: Diagnosis present

## 2020-10-16 LAB — CBC WITH DIFFERENTIAL/PLATELET
Abs Immature Granulocytes: 0.02 10*3/uL (ref 0.00–0.07)
Basophils Absolute: 0.1 10*3/uL (ref 0.0–0.1)
Basophils Relative: 1 %
Eosinophils Absolute: 0.2 10*3/uL (ref 0.0–0.5)
Eosinophils Relative: 3 %
HCT: 41.1 % (ref 36.0–46.0)
Hemoglobin: 13 g/dL (ref 12.0–15.0)
Immature Granulocytes: 0 %
Lymphocytes Relative: 25 %
Lymphs Abs: 1.5 10*3/uL (ref 0.7–4.0)
MCH: 25.9 pg — ABNORMAL LOW (ref 26.0–34.0)
MCHC: 31.6 g/dL (ref 30.0–36.0)
MCV: 81.9 fL (ref 80.0–100.0)
Monocytes Absolute: 0.4 10*3/uL (ref 0.1–1.0)
Monocytes Relative: 6 %
Neutro Abs: 3.9 10*3/uL (ref 1.7–7.7)
Neutrophils Relative %: 65 %
Platelets: 319 10*3/uL (ref 150–400)
RBC: 5.02 MIL/uL (ref 3.87–5.11)
RDW: 13.4 % (ref 11.5–15.5)
WBC: 6.1 10*3/uL (ref 4.0–10.5)
nRBC: 0 % (ref 0.0–0.2)

## 2020-10-16 LAB — COMPREHENSIVE METABOLIC PANEL
ALT: 16 U/L (ref 0–44)
AST: 20 U/L (ref 15–41)
Albumin: 4.5 g/dL (ref 3.5–5.0)
Alkaline Phosphatase: 48 U/L (ref 38–126)
Anion gap: 7 (ref 5–15)
BUN: 15 mg/dL (ref 6–20)
CO2: 26 mmol/L (ref 22–32)
Calcium: 9.6 mg/dL (ref 8.9–10.3)
Chloride: 106 mmol/L (ref 98–111)
Creatinine, Ser: 0.7 mg/dL (ref 0.44–1.00)
GFR, Estimated: >60 mL/min (ref >60)
Glucose, Bld: 84 mg/dL (ref 70–99)
Potassium: 4.4 mmol/L (ref 3.5–5.1)
Sodium: 139 mmol/L (ref 135–145)
Total Bilirubin: 0.5 mg/dL (ref 0.3–1.2)
Total Protein: 7.4 g/dL (ref 6.5–8.1)

## 2020-10-16 LAB — URINALYSIS, ROUTINE W REFLEX MICROSCOPIC
Bacteria, UA: NONE SEEN
Bilirubin Urine: NEGATIVE
Glucose, UA: NEGATIVE mg/dL
Hgb urine dipstick: NEGATIVE
Ketones, ur: NEGATIVE mg/dL
Nitrite: NEGATIVE
Protein, ur: NEGATIVE mg/dL
Specific Gravity, Urine: 1.02 (ref 1.005–1.030)
pH: 6 (ref 5.0–8.0)

## 2020-10-16 LAB — PREGNANCY, URINE: Preg Test, Ur: NEGATIVE

## 2020-10-16 LAB — LIPASE, BLOOD: Lipase: 28 U/L (ref 11–51)

## 2020-10-16 MED ORDER — IOHEXOL 350 MG/ML SOLN
80.0000 mL | Freq: Once | INTRAVENOUS | Status: AC | PRN
  Administered 2020-10-16: 80 mL via INTRAVENOUS

## 2020-10-16 NOTE — ED Provider Notes (Signed)
[facility]-EMERGENCY DEPT Provider Note   CSN: [id number] Arrival date & time: 10/16/20  1234     History Chief Complaint  Patient presents with   Mass    Jackie Brooks is a 35 y.o. female.  Patient presents for evaluation of abdominal pain. Patient has noted an intermittent "lump" with abdominal discomfort just above her umbilicus. Has had similar episodes in the past that resolved spontaneously. Current episode started 2 days ago after lifting weight. Prior c-section.  The history is provided by the patient. No language interpreter was used.  Abdominal Pain Pain location:  Periumbilical Pain quality: sharp   Pain radiates to:  Does not radiate Pain severity:  Moderate Onset quality:  Gradual Duration:  2 days Timing:  Intermittent Progression:  Waxing and waning Chronicity:  Recurrent Context: previous surgery   Worsened by:  Palpation Associated symptoms: nausea       Past Medical History:  Diagnosis Date   Headache(784.0)    Infection    UTI   Medical history non-contributory     Patient Active Problem List   Diagnosis Date Noted   Family history of breast cancer 03/29/2019   Chronic right shoulder pain 12/05/2018   Bacterial vaginitis 11/22/2018   Stiffness of right shoulder joint 11/16/2018   Encounter for orthopedic follow-up care 09/28/2018   Anterior to posterior tear of superior glenoid labrum of right shoulder 09/28/2018   Pain in joint of right shoulder 09/13/2018   Ileus, postoperative (HCC) 12/15/2012   Edema 12/15/2012   Anemia 12/14/2012   Status post primary low transverse cesarean section 12/14/2012   Post-dates pregnancy 12/09/2012   Late prenatal care 09/02/2012   Migraines 09/02/2012    Past Surgical History:  Procedure Laterality Date   CESAREAN SECTION N/A 12/11/2012   Procedure: CESAREAN SECTION;  Surgeon: Geryl Rankins, MD;  Location: [facility];  Service: Obstetrics;  Laterality: N/A;   NO PAST SURGERIES      SHOULDER SURGERY Right 09/28/2018     OB History     Gravida  1   Para  1   Term  1   Preterm  0   AB  0   Living  1      SAB  0   IAB  0   Ectopic  0   Multiple  0   Live Births  1           Family History  Problem Relation Age of Onset   Cancer Paternal Aunt        ? brain   Cancer Paternal Grandmother        breast   Hypertension Mother    Diabetes Mother    Diabetes Father     Social History   Tobacco Use   Smoking status: Never   Smokeless tobacco: Never  Substance Use Topics   Alcohol use: No   Drug use: No    Home Medications Prior to Admission medications   Medication Sig Start Date End Date Taking? Authorizing Provider  acetaminophen (TYLENOL) 650 MG CR tablet Take 650 mg by mouth every 8 (eight) hours as needed for pain.    [provider]  Aspirin-Caffeine 845-65 MG PACK Take by mouth.    [provider]  Boric Acid POWD Place one suppository in the vagina every night before bed. Patient not taking: Reported on 07/23/2020 03/28/19   Gerrit Heck, CNM  famotidine (PEPCID) 20 MG tablet Take 1 tablet (20 mg total) by  mouth 2 (two) times daily. Patient not taking: Reported on 09/26/2014 09/02/13 11/15/14  Eber Hong, MD    Allergies    Patient has no known allergies.  Review of Systems   Review of Systems  Gastrointestinal:  Positive for abdominal pain and nausea.  Psychiatric/Behavioral:  Positive for agitation.   All other systems reviewed and are negative.  Physical Exam Updated Vital Signs BP 126/82 (BP Location: Left Arm)   Pulse 79   Temp 98.2 F (36.8 C) (Oral)   Resp 18   SpO2 100%   Physical Exam Vitals reviewed.  Constitutional:      General: She is not in acute distress.    Appearance: Normal appearance.  HENT:     Head: Normocephalic.     Nose: Nose normal.     Mouth/Throat:     Mouth: Mucous membranes are moist.  Eyes:     Conjunctiva/sclera: Conjunctivae normal.  Cardiovascular:      Rate and Rhythm: Normal rate.  Pulmonary:     Effort: Pulmonary effort is normal.  Abdominal:     Palpations: Abdomen is soft.     Tenderness: There is abdominal tenderness in the periumbilical area.    Musculoskeletal:        General: Normal range of motion.  Skin:    General: Skin is warm and dry.  Neurological:     Mental Status: She is alert and oriented to person, place, and time.  Psychiatric:        Mood and Affect: Mood normal.        Behavior: Behavior normal.    ED Results / Procedures / Treatments   Labs (all labs ordered are listed, but only abnormal results are displayed) Labs Reviewed  CBC WITH DIFFERENTIAL/PLATELET - Abnormal; Notable for the following components:      Result Value   MCH 25.9 (*)    All other components within normal limits  URINALYSIS, ROUTINE W REFLEX MICROSCOPIC - Abnormal; Notable for the following components:   APPearance HAZY (*)    Leukocytes,Ua TRACE (*)    All other components within normal limits  COMPREHENSIVE METABOLIC PANEL  LIPASE, BLOOD  PREGNANCY, URINE    EKG None  Radiology CT ABDOMEN PELVIS W CONTRAST  Result Date: 10/16/2020 CLINICAL DATA:  Abdominal pain. Lump above belly button, hernia suspected. EXAM: CT ABDOMEN AND PELVIS WITH CONTRAST TECHNIQUE: Multidetector CT imaging of the abdomen and pelvis was performed using the standard protocol following bolus administration of intravenous contrast. CONTRAST:  84mL OMNIPAQUE IOHEXOL 350 MG/ML SOLN COMPARISON:  CT 04/23/2013 FINDINGS: Lower chest: The lung bases are clear. Hepatobiliary: 5 mm hypodensity in the left lobe of the liver is too small to characterize. Mild background hepatic steatosis. Gallbladder is partially distended. No calcified gallstone or pericholecystic fat stranding. No biliary dilatation. Pancreas: No ductal dilatation or inflammation. Spleen: Normal in size without focal abnormality. Adrenals/Urinary Tract: No adrenal nodule. Homogeneous renal  enhancement. No hydronephrosis or perinephric edema. No renal calculi or focal renal abnormality. Partially distended urinary bladder. Equivocal bladder wall thickening versus nondistention. Stomach/Bowel: Decompressed stomach. No small bowel obstruction or inflammation. Normal air-filled appendix. Moderate colonic stool burden. Transverse colon is redundant. No colonic wall thickening or pericolonic edema. No abnormal rectal distention. Vascular/Lymphatic: Normal caliber abdominal aorta. Patent portal vein. No acute vascular findings. No abdominopelvic adenopathy. Reproductive: Anteverted uterus. Slight heterogeneous myometrium at the fundus. Possible small fundal fibroid. Symmetric appearance of the ovaries without adnexal mass. Other: There is a small fat containing  umbilical hernia. No bowel involvement or inflammation. There may be a tiny supraumbilical ventral abdominal hernia, series 5, image 86, but no significant herniation of fat. No abdominopelvic ascites. No free air or focal fluid collection. Musculoskeletal: There are no acute or suspicious osseous abnormalities. IMPRESSION: 1. Small fat containing umbilical hernia without bowel involvement or inflammation. There may be a tiny supraumbilical ventral abdominal hernia, but no significant herniation of fat. 2. Equivocal bladder wall thickening versus nondistention. 3. Mild hepatic steatosis. Electronically Signed   By: Narda Rutherford M.D.   On: 10/16/2020 17:15    Procedures Procedures   Medications Ordered in ED Medications - No data to display  ED Course  I have reviewed the triage vital signs and the nursing notes.  Pertinent labs & imaging results that were available during my care of the patient were reviewed by me and considered in my medical decision making (see chart for details).    MDM Rules/Calculators/A&P                           Patient is nontoxic, nonseptic appearing, in no apparent distress.  Labs, imaging and vitals  reviewed.  Patient does not meet the SIRS or Sepsis criteria.  On repeat exam patient does not have a surgical abdomin and there are no peritoneal signs.  No indication of appendicitis, bowel obstruction, bowel perforation, cholecystitis, diverticulitis, or pregnancy.  CT reveals umbilical hernia containing fat. No bowel involvement. Patient discharged home with symptomatic treatment and instructions for follow-up with their primary care physician and/or general surgery.  I have also discussed reasons to return immediately to the ER.  Patient expresses understanding and agrees with plan.   Final Clinical Impression(s) / ED Diagnoses Final diagnoses:  Umbilical hernia without obstruction and without gangrene    Rx / DC Orders ED Discharge Orders     None        Felicie Morn, NP 10/16/20 2317    Gloris Manchester, MD 10/17/20 [phone]

## 2020-10-16 NOTE — ED Triage Notes (Signed)
Pt arrived via POV, c/o lump above bully button, not visible, but able to feel. States worse with laughter or stretching. States same problem months ago, self resolved.

## 2020-10-16 NOTE — ED Provider Notes (Signed)
Emergency Medicine Provider Triage Evaluation Note  Jackie Brooks , a 35 y.o. female  was evaluated in triage.  Pt complains of epigastric pain x 2 days. Worse with laughing, started after lifting weight. Pain comes and goes. Does not radiate. C section, no other prior abd. Surgeries.  Review of Systems  Positive: Epigastric pain Negative: N/V  Physical Exam  BP 126/82 (BP Location: Left Arm)   Pulse 79   Temp 98.2 F (36.8 C) (Oral)   Resp 18   SpO2 100%  Gen:   Awake, no distress   Resp:  Normal effort  MSK:   Moves extremities without difficulty  Other:  Epigastric tenderness   Medical Decision Making  Medically screening exam initiated at 1:04 PM.  Appropriate orders placed.  Jackie Brooks was informed that the remainder of the evaluation will be completed by another provider, this initial triage assessment does not replace that evaluation, and the importance of remaining in the ED until their evaluation is complete.     Theron Arista, PA-C 10/16/20 1305    Ernie Avena, MD 10/16/20 1311

## 2020-10-16 NOTE — Discharge Instructions (Addendum)
Please refer to the attached instructions and follow up with surgery as discussed.

## 2020-10-17 ENCOUNTER — Telehealth: Payer: Self-pay

## 2020-10-17 NOTE — Telephone Encounter (Signed)
Transition Care Management Unsuccessful Follow-up Telephone Call  Date of discharge and from where:  10/16/2020-[place]   Attempts:  1st Attempt  Reason for unsuccessful TCM follow-up call:  Left voice message

## 2020-10-20 NOTE — Telephone Encounter (Signed)
Transition Care Management Follow-up Telephone Call Date of discharge and from where: 10/16/2020 from [facility] How have you been since you were released from the hospital? Pt stated that her pain is the same. Pt has a surgical consult scheduled.  Any questions or concerns? No  Items Reviewed: Did the pt receive and understand the discharge instructions provided? Yes  Medications obtained and verified? Yes  Other? No  Any new allergies since your discharge? No  Dietary orders reviewed? No Do you have support at home? Yes   Functional Questionnaire: (I = Independent and D = Dependent) ADLs: I  Bathing/Dressing- I  Meal Prep- I  Eating- I  Maintaining continence- I  Transferring/Ambulation- I  Managing Meds- I   Follow up appointments reviewed:  PCP Hospital f/u appt confirmed? No   Specialist Hospital f/u appt confirmed? Yes  Scheduled to see [facility] on 11/06/2020. Are transportation arrangements needed? No  If their condition worsens, is the pt aware to call PCP or go to the Emergency Dept.? Yes Was the patient provided with contact information for the PCP's office or ED? Yes Was to pt encouraged to call back with questions or concerns? Yes

## 2020-11-06 DIAGNOSIS — K439 Ventral hernia without obstruction or gangrene: Secondary | ICD-10-CM | POA: Diagnosis not present

## 2020-11-13 ENCOUNTER — Ambulatory Visit: Payer: Medicaid Other

## 2020-11-13 ENCOUNTER — Other Ambulatory Visit: Payer: Self-pay

## 2020-11-13 NOTE — Progress Notes (Signed)
Went to lobby to get for nurse visit, pt not in lobby. Front office called cell # x 2 with no answer. Pt did not show before office closed.  Judeth Cornfield, RN

## 2021-02-11 DIAGNOSIS — R079 Chest pain, unspecified: Secondary | ICD-10-CM | POA: Diagnosis not present

## 2021-02-11 DIAGNOSIS — R55 Syncope and collapse: Secondary | ICD-10-CM | POA: Diagnosis not present

## 2021-02-11 DIAGNOSIS — Z0000 Encounter for general adult medical examination without abnormal findings: Secondary | ICD-10-CM | POA: Diagnosis not present

## 2021-03-09 DIAGNOSIS — H5213 Myopia, bilateral: Secondary | ICD-10-CM | POA: Diagnosis not present

## 2021-03-12 DIAGNOSIS — R55 Syncope and collapse: Secondary | ICD-10-CM | POA: Diagnosis not present

## 2021-03-12 DIAGNOSIS — R079 Chest pain, unspecified: Secondary | ICD-10-CM | POA: Diagnosis not present

## 2021-03-12 DIAGNOSIS — E871 Hypo-osmolality and hyponatremia: Secondary | ICD-10-CM | POA: Diagnosis not present

## 2021-03-17 DIAGNOSIS — Z803 Family history of malignant neoplasm of breast: Secondary | ICD-10-CM | POA: Diagnosis not present

## 2021-03-17 DIAGNOSIS — N644 Mastodynia: Secondary | ICD-10-CM | POA: Diagnosis not present

## 2021-03-19 DIAGNOSIS — R079 Chest pain, unspecified: Secondary | ICD-10-CM | POA: Diagnosis not present

## 2021-03-19 DIAGNOSIS — R55 Syncope and collapse: Secondary | ICD-10-CM | POA: Diagnosis not present

## 2021-03-19 DIAGNOSIS — E785 Hyperlipidemia, unspecified: Secondary | ICD-10-CM | POA: Diagnosis not present

## 2021-03-31 DIAGNOSIS — Z803 Family history of malignant neoplasm of breast: Secondary | ICD-10-CM | POA: Diagnosis not present

## 2021-03-31 DIAGNOSIS — R922 Inconclusive mammogram: Secondary | ICD-10-CM | POA: Diagnosis not present

## 2021-03-31 DIAGNOSIS — N644 Mastodynia: Secondary | ICD-10-CM | POA: Diagnosis not present

## 2021-03-31 DIAGNOSIS — N6459 Other signs and symptoms in breast: Secondary | ICD-10-CM | POA: Diagnosis not present

## 2021-04-06 ENCOUNTER — Emergency Department (HOSPITAL_COMMUNITY): Payer: Medicaid Other

## 2021-04-06 ENCOUNTER — Encounter (HOSPITAL_COMMUNITY): Payer: Self-pay

## 2021-04-06 ENCOUNTER — Emergency Department (HOSPITAL_COMMUNITY)
Admission: EM | Admit: 2021-04-06 | Discharge: 2021-04-06 | Disposition: A | Discharge status: Home / Self Care | Payer: Medicaid Other | Attending: Emergency Medicine | Admitting: Emergency Medicine

## 2021-04-06 ENCOUNTER — Other Ambulatory Visit: Payer: Self-pay

## 2021-04-06 DIAGNOSIS — M79672 Pain in left foot: Secondary | ICD-10-CM | POA: Insufficient documentation

## 2021-04-06 DIAGNOSIS — Z7982 Long term (current) use of aspirin: Secondary | ICD-10-CM | POA: Insufficient documentation

## 2021-04-06 MED ORDER — MELOXICAM 7.5 MG PO TABS: 7.5000 mg | ORAL_TABLET | Freq: Every day | ORAL | 0 refills | Status: DC

## 2021-04-06 NOTE — Discharge Instructions (Signed)
Please read and follow all provided instructions.  Your diagnoses today include:  1. Foot pain, left     Tests performed today include: An x-ray of the affected area - does NOT show any broken bones Vital signs. See below for your results today.   Medications prescribed:  Meloxicam - anti-inflammatory pain medication  You have been prescribed an anti-inflammatory medication or NSAID. Take with food. Do not take aspirin, ibuprofen, or naproxen if taking this medication. Take smallest effective dose for the shortest duration needed for your pain. Stop taking if you experience stomach pain or vomiting.   Take any prescribed medications only as directed.  Home care instructions:  Follow any educational materials contained in this packet Follow R.I.C.E. Protocol: R - rest your injury  I  - use ice on injury without applying directly to skin C - compress injury with bandage or splint E - elevate the injury as much as possible  Follow-up instructions: Please follow-up with your primary care provider or the provided orthopedic physician (bone specialist) if you continue to have significant pain in 1 week. In this case you may have a more severe injury that requires further care.   Return instructions:  Please return if your toes or feet are numb or tingling, appear gray or blue, or you have severe pain (also elevate the leg and loosen splint or wrap if you were given one) Please return to the Emergency Department if you experience worsening symptoms.  Please return if you have any other emergent concerns.  Additional Information:  Your vital signs today were: BP 119/86 (BP Location: Right Arm)    Pulse 82    Temp 97.7 F (36.5 C) (Oral)    Resp 14    Ht 4\' 11"  (1.499 m)    Wt 74.8 kg    SpO2 100%    BMI 33.33 kg/m  If your blood pressure (BP) was elevated above 135/85 this visit, please have this repeated by your doctor within one month. --------------

## 2021-04-06 NOTE — ED Provider Notes (Signed)
[facility]-EMERGENCY DEPT Provider Note   CSN: [id number] Arrival date & time: 04/06/21  1924     History  Chief Complaint  Patient presents with   Foot Pain    Jackie Brooks is a 36 y.o. female.  Patient presents to the emergency department for evaluation of lateral left foot pain starting 2 days ago.  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  She spends a lot of time on her feet.  She denies acute injury to the foot.  She had taken a break at work and when she tried to go back, noted the pain.  Pain is much worse with bearing weight and movement at this point.  She thinks that the outside of her foot is swollen.  No fevers or chills.  No history of arthritis or gout.      Home Medications Prior to Admission medications   Medication Sig Start Date End Date Taking? Authorizing Provider  acetaminophen (TYLENOL) 650 MG CR tablet Take 650 mg by mouth every 8 (eight) hours as needed for pain.    [provider]  Aspirin-Caffeine 845-65 MG PACK Take by mouth.    [provider]  Boric Acid POWD Place one suppository in the vagina every night before bed. Patient not taking: Reported on 07/23/2020 03/28/19   Gerrit Heck, CNM  famotidine (PEPCID) 20 MG tablet Take 1 tablet (20 mg total) by mouth 2 (two) times daily. Patient not taking: Reported on 09/26/2014 09/02/13 11/15/14  Eber Hong, MD      Allergies    Patient has no known allergies.    Review of Systems   Review of Systems  Physical Exam Updated Vital Signs BP 119/86 (BP Location: Right Arm)    Pulse 82    Temp 97.7 F (36.5 C) (Oral)    Resp 14    Ht 4\' 11"  (1.499 m)    Wt 74.8 kg    SpO2 100%    BMI 33.33 kg/m  Physical Exam Vitals and nursing note reviewed.  Constitutional:      Appearance: She is well-developed.  HENT:     Head: Normocephalic and atraumatic.  Eyes:     Pupils: Pupils are equal, round, and reactive to light.  Cardiovascular:     Pulses: Normal pulses. No  decreased pulses.  Musculoskeletal:        General: Tenderness present.     Cervical back: Normal range of motion and neck supple.     Left foot: Tenderness and bony tenderness present.     Comments: Patient with tenderness over the fifth metatarsal bone proximally.  No redness or warmth.  No first MTP joint swelling or redness.  No tenderness over the sole of the foot.  Skin:    General: Skin is warm and dry.  Neurological:     Mental Status: She is alert.     Sensory: No sensory deficit.     Comments: Motor, sensation, and vascular distal to the injury is fully intact.   Psychiatric:        Mood and Affect: Mood normal.    ED Results / Procedures / Treatments   Labs (all labs ordered are listed, but only abnormal results are displayed) Labs Reviewed - No data to display  EKG None  Radiology No results found.  Procedures Procedures    Medications Ordered in ED Medications - No data to display  ED Course/ Medical Decision Making/ A&P    Patient seen and  examined. History obtained directly from patient.   Labs/EKG: none ordered.  Imaging: X-ray of the foot  Medications/Fluids: None ordered  Most recent vital signs reviewed and are as follows: BP 119/86 (BP Location: Right Arm)    Pulse 82    Temp 97.7 F (36.5 C) (Oral)    Resp 14    Ht 4\' 11"  (1.499 m)    Wt 74.8 kg    SpO2 100%    BMI 33.33 kg/m   Initial impression: Left foot pain, possible overuse injury  8:22 PM Reassessment performed. Patient appears comfortable.   Labs and imaging personally reviewed and interpreted including: X-ray of the foot, agree no fracture.  Reviewed additional pertinent lab work and imaging with patient at bedside including: Negative x-ray imaging.  Most current vital signs reviewed and are as follows: BP 119/86 (BP Location: Right Arm)    Pulse 82    Temp 97.7 F (36.5 C) (Oral)    Resp 14    Ht 4\' 11"  (1.499 m)    Wt 74.8 kg    SpO2 100%    BMI 33.33 kg/m   Plan:  Discharge to home with postop shoe and crutches  Home treatment: Prescription written for meloxicam.   Return and follow-up instructions: Encouraged follow-up to podiatry referral in the next 1 week if needed.                            Medical Decision Making Amount and/or Complexity of Data Reviewed Radiology: ordered.   Patient with lateral foot pain.  No redness or warmth to suggest cellulitis.  No focal swelling or redness to suggest septic arthritis.  No signs of gout.  Suspect overuse from the patient's job.  Plan as above.        Final Clinical Impression(s) / ED Diagnoses Final diagnoses:  Foot pain, left    Rx / DC Orders ED Discharge Orders          Ordered    meloxicam (MOBIC) 7.5 MG tablet  Daily        04/06/21 2017              06/04/21, PA-C 04/06/21 2024    06/04/21 P, DO 04/07/21 1002

## 2021-04-06 NOTE — ED Triage Notes (Signed)
Pt reports with left foot pain that has been going on since Friday. Pt states that the pain is on the side of her foot.

## 2021-04-07 ENCOUNTER — Telehealth: Payer: Self-pay

## 2021-04-07 NOTE — Telephone Encounter (Signed)
Transition Care Management Unsuccessful Follow-up Telephone Call  Date of discharge and from where:  04/06/2021-[place]   Attempts:  1st Attempt  Reason for unsuccessful TCM follow-up call:  Left voice message

## 2021-04-08 NOTE — Telephone Encounter (Signed)
Patient is seen at [facility] PCP

## 2021-04-23 DIAGNOSIS — H109 Unspecified conjunctivitis: Secondary | ICD-10-CM | POA: Diagnosis not present

## 2021-06-25 ENCOUNTER — Other Ambulatory Visit (HOSPITAL_COMMUNITY)
Admission: RE | Admit: 2021-06-25 | Discharge: 2021-06-25 | Disposition: A | Discharge status: Home / Self Care | Payer: Medicaid Other | Source: Ambulatory Visit | Attending: Family Medicine | Admitting: Family Medicine

## 2021-06-25 ENCOUNTER — Ambulatory Visit (INDEPENDENT_AMBULATORY_CARE_PROVIDER_SITE_OTHER): Payer: Medicaid Other

## 2021-06-25 VITALS — BP 114/74 | HR 73 | Wt 174.6 lb

## 2021-06-25 DIAGNOSIS — R8290 Unspecified abnormal findings in urine: Secondary | ICD-10-CM | POA: Diagnosis not present

## 2021-06-25 DIAGNOSIS — N898 Other specified noninflammatory disorders of vagina: Secondary | ICD-10-CM | POA: Diagnosis not present

## 2021-06-25 LAB — POCT URINALYSIS DIP (DEVICE)
Bilirubin Urine: NEGATIVE
Glucose, UA: NEGATIVE mg/dL
Hgb urine dipstick: NEGATIVE
Ketones, ur: NEGATIVE mg/dL
Leukocytes,Ua: NEGATIVE
Nitrite: POSITIVE — AB
Protein, ur: NEGATIVE mg/dL
Specific Gravity, Urine: 1.02 (ref 1.005–1.030)
Urobilinogen, UA: 0.2 mg/dL (ref 0.0–1.0)
pH: 7 (ref 5.0–8.0)

## 2021-06-25 NOTE — Progress Notes (Signed)
Patient here today with the complaint of vaginal irritation and malodorous urine. Patient requested to be checked for a UTI and to do a self swab. I instructed patient on how to collect self swab. Self swab collected. Urine analysis completed. Urine culture collected. I informed patient we will notify her with any abnormal results. Patient verbalized understanding and denies any other questions. ? ?Alesia Richards, RN ?06/25/21 ?

## 2021-06-26 ENCOUNTER — Telehealth: Payer: Self-pay

## 2021-06-26 DIAGNOSIS — N898 Other specified noninflammatory disorders of vagina: Secondary | ICD-10-CM

## 2021-06-26 DIAGNOSIS — N390 Urinary tract infection, site not specified: Secondary | ICD-10-CM

## 2021-06-26 LAB — CERVICOVAGINAL ANCILLARY ONLY
Bacterial Vaginitis (gardnerella): NEGATIVE
Candida Glabrata: NEGATIVE
Candida Vaginitis: POSITIVE — AB
Chlamydia: NEGATIVE
Comment: NEGATIVE
Comment: NEGATIVE
Comment: NEGATIVE
Comment: NEGATIVE
Comment: NEGATIVE
Comment: NORMAL
Neisseria Gonorrhea: NEGATIVE
Trichomonas: NEGATIVE

## 2021-06-26 MED ORDER — FLUCONAZOLE 150 MG PO TABS: 150.0000 mg | ORAL_TABLET | Freq: Once | ORAL | 0 refills | Status: AC

## 2021-06-26 MED ORDER — NITROFURANTOIN MONOHYD MACRO 100 MG PO CAPS
100.0000 mg | ORAL_CAPSULE | Freq: Two times a day (BID) | ORAL | 0 refills | Status: DC
Start: 2021-06-26 — End: 2022-03-17

## 2021-06-26 NOTE — Telephone Encounter (Signed)
Patient called front office and requested to speak with someone about results. Called pt. Reviewed that result of urinalysis is from visit yesterday. Shows nitrites which is suspicious for UTI so urine culture was sent. Pt describes malodorous urine and pain in left side while urinating. Reports vaginal itching. Reviewed with Alvester Morin, MD who states patient may take Macrobid while waiting for urine culture results; ordered per protocol. Diflucan tablet sent for yeast infection.  ?

## 2021-06-28 ENCOUNTER — Other Ambulatory Visit: Payer: Self-pay | Admitting: Obstetrics & Gynecology

## 2021-06-28 DIAGNOSIS — N898 Other specified noninflammatory disorders of vagina: Secondary | ICD-10-CM

## 2021-06-28 MED ORDER — FLUCONAZOLE 150 MG PO TABS
150.0000 mg | ORAL_TABLET | Freq: Once | ORAL | 0 refills | Status: AC
Start: 2021-06-28 — End: 2021-06-28

## 2021-06-28 NOTE — Progress Notes (Signed)
Meds ordered this encounter  Medications   fluconazole (DIFLUCAN) 150 MG tablet    Sig: Take 1 tablet (150 mg total) by mouth once for 1 dose.    Dispense:  1 tablet    Refill:  0    

## 2021-06-29 LAB — URINE CULTURE

## 2021-06-30 DIAGNOSIS — R630 Anorexia: Secondary | ICD-10-CM | POA: Diagnosis not present

## 2021-06-30 DIAGNOSIS — F321 Major depressive disorder, single episode, moderate: Secondary | ICD-10-CM | POA: Diagnosis not present

## 2021-06-30 DIAGNOSIS — R232 Flushing: Secondary | ICD-10-CM | POA: Diagnosis not present

## 2021-06-30 DIAGNOSIS — N926 Irregular menstruation, unspecified: Secondary | ICD-10-CM | POA: Diagnosis not present

## 2022-03-17 ENCOUNTER — Telehealth: Payer: Medicaid Other | Admitting: Physician Assistant

## 2022-03-17 DIAGNOSIS — R3989 Other symptoms and signs involving the genitourinary system: Secondary | ICD-10-CM

## 2022-03-17 MED ORDER — CEPHALEXIN 500 MG PO CAPS: 500.0000 mg | ORAL_CAPSULE | Freq: Two times a day (BID) | ORAL | 0 refills | Status: DC

## 2022-03-17 NOTE — Progress Notes (Signed)

## 2022-03-25 MED ORDER — FLUCONAZOLE 150 MG PO TABS: 150.0000 mg | ORAL_TABLET | Freq: Once | ORAL | 0 refills | Status: AC

## 2022-03-25 NOTE — Addendum Note (Signed)
Addended by: Mar Daring on: 03/25/2022 01:37 PM   Modules accepted: Orders

## 2022-04-09 DIAGNOSIS — Z1231 Encounter for screening mammogram for malignant neoplasm of breast: Secondary | ICD-10-CM | POA: Diagnosis not present

## 2022-04-09 DIAGNOSIS — R92333 Mammographic heterogeneous density, bilateral breasts: Secondary | ICD-10-CM | POA: Diagnosis not present

## 2022-05-14 ENCOUNTER — Telehealth: Payer: Medicaid Other | Admitting: Physician Assistant

## 2022-05-14 ENCOUNTER — Other Ambulatory Visit: Payer: Self-pay | Admitting: Physician Assistant

## 2022-05-14 ENCOUNTER — Other Ambulatory Visit: Payer: Self-pay | Admitting: Family Medicine

## 2022-05-14 DIAGNOSIS — R3989 Other symptoms and signs involving the genitourinary system: Secondary | ICD-10-CM | POA: Diagnosis not present

## 2022-05-14 MED ORDER — FLUCONAZOLE 150 MG PO TABS: 150.0000 mg | ORAL_TABLET | Freq: Once | ORAL | 0 refills | Status: AC

## 2022-05-14 MED ORDER — NITROFURANTOIN MONOHYD MACRO 100 MG PO CAPS: 100.0000 mg | ORAL_CAPSULE | Freq: Two times a day (BID) | ORAL | 0 refills | Status: DC

## 2022-05-14 NOTE — Progress Notes (Signed)
I have spent 5 minutes in review of e-visit questionnaire, review and updating patient chart, medical decision making and response to patient.   Keondre Markson Cody Montel Vanderhoof, PA-C    

## 2022-05-14 NOTE — Progress Notes (Signed)
E-Visit for Urinary Problems  We are sorry that you are not feeling well.  Here is how we plan to help!  Based on what you shared with me it looks like you most likely have a simple urinary tract infection.  A UTI (Urinary Tract Infection) is a bacterial infection of the bladder.  Most cases of urinary tract infections are simple to treat but a key part of your care is to encourage you to drink plenty of fluids and watch your symptoms carefully.  I have prescribed MacroBid 100 mg twice a day for 5 days.  Your symptoms should gradually improve. Call us if the burning in your urine worsens, you develop worsening fever, back pain or pelvic pain or if your symptoms do not resolve after completing the antibiotic.  I will also send in Diflucan in case of an antibiotic-induced yeast infection  Urinary tract infections can be prevented by drinking plenty of water to keep your body hydrated.  Also be sure when you wipe, wipe from front to back and don't hold it in!  If possible, empty your bladder every 4 hours.  HOME CARE Drink plenty of fluids Compete the full course of the antibiotics even if the symptoms resolve Remember, when you need to go.go. Holding in your urine can increase the likelihood of getting a UTI! GET HELP RIGHT AWAY IF: You cannot urinate You get a high fever Worsening back pain occurs You see blood in your urine You feel sick to your stomach or throw up You feel like you are going to pass out  MAKE SURE YOU  Understand these instructions. Will watch your condition. Will get help right away if you are not doing well or get worse.   Thank you for choosing an e-visit.  Your e-visit answers were reviewed by a board certified advanced clinical practitioner to complete your personal care plan. Depending upon the condition, your plan could have included both over the counter or prescription medications.  Please review your pharmacy choice. Make sure the pharmacy is open so  you can pick up prescription now. If there is a problem, you may contact your provider through [employer] and have the prescription routed to another pharmacy.  Your safety is important to [place]. If you have drug allergies check your prescription carefully.   For the next 24 hours you can use MyChart to ask questions about today's visit, request a non-urgent call back, or ask for a work or school excuse. You will get an email in the next two days asking about your experience. I hope that your e-visit has been valuable and will speed your recovery.

## 2022-06-23 ENCOUNTER — Encounter: Payer: Self-pay | Admitting: Family Medicine

## 2022-06-23 ENCOUNTER — Ambulatory Visit (INDEPENDENT_AMBULATORY_CARE_PROVIDER_SITE_OTHER): Payer: Medicaid Other | Admitting: Family Medicine

## 2022-06-23 ENCOUNTER — Other Ambulatory Visit (HOSPITAL_COMMUNITY)
Admission: RE | Admit: 2022-06-23 | Discharge: 2022-06-23 | Disposition: A | Discharge status: Home / Self Care | Payer: Medicaid Other | Source: Ambulatory Visit | Attending: Family Medicine | Admitting: Family Medicine

## 2022-06-23 ENCOUNTER — Other Ambulatory Visit: Payer: Self-pay

## 2022-06-23 VITALS — BP 112/77 | HR 87 | Ht 59.0 in | Wt 173.1 lb

## 2022-06-23 DIAGNOSIS — Z0289 Encounter for other administrative examinations: Secondary | ICD-10-CM

## 2022-06-23 DIAGNOSIS — N898 Other specified noninflammatory disorders of vagina: Secondary | ICD-10-CM

## 2022-06-23 DIAGNOSIS — K921 Melena: Secondary | ICD-10-CM

## 2022-06-23 DIAGNOSIS — N951 Menopausal and female climacteric states: Secondary | ICD-10-CM | POA: Diagnosis not present

## 2022-06-23 DIAGNOSIS — R109 Unspecified abdominal pain: Secondary | ICD-10-CM | POA: Insufficient documentation

## 2022-06-23 DIAGNOSIS — N390 Urinary tract infection, site not specified: Secondary | ICD-10-CM | POA: Insufficient documentation

## 2022-06-23 MED ORDER — NITROFURANTOIN MONOHYD MACRO 100 MG PO CAPS: 100.0000 mg | ORAL_CAPSULE | Freq: Two times a day (BID) | ORAL | 0 refills | Status: AC

## 2022-06-23 MED ORDER — CEPHALEXIN 250 MG PO CAPS: 250.0000 mg | ORAL_CAPSULE | Freq: Every evening | ORAL | 2 refills | Status: AC

## 2022-06-23 MED ORDER — FLUCONAZOLE 150 MG PO TABS: 150.0000 mg | ORAL_TABLET | Freq: Once | ORAL | 0 refills | Status: AC

## 2022-06-23 NOTE — Assessment & Plan Note (Signed)
Gives hx concerning for POI and has a family hx of such as well. Will do basic labs. This may be contributing to her other symptoms, especially recurrent UTI though no significant vaginal dryness was seen on exam.

## 2022-06-23 NOTE — Progress Notes (Signed)
GYNECOLOGY OFFICE VISIT NOTE  History:   Jackie Brooks is a 37 y.o. G1P1001 here today for UTI and menopause concern.  On review of chart:  - had E visit and was sent macrobid and diflucan on 05/14/22, no urine culture obtained - had another E visit on 03/17/22, sent macrobid at that time, followed up a few days later and had keflex and diflucan sent on 03/25/22 - last urine culture is from 1 year ago on 06/25/2021, +E.coli, rx sent for macrobid - also had [facility] visit 06/30/2021 with concern for menopausal symptoms, FSH/LH normal at that time - last urine culture prior to that 04/18/2020, +E.coli - has had a few Ucx prior to that also positive for E.coli  Today reports she's having frequent urination  No burning or pain with urination Notices malodorous urine that comes and goes Reports urine is usually cloudy, alternates between dark and light Has also been having pain in her R flank daily She is a Merchandiser, retail at [employer], 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 Worried she may have something wrong with her kidney No family hx of kidney disease, reports no one in her family is on dialysis or has polycystic kidneys  Also reports vaginal itching, burning, and irritation Does not douche Uses an "orange antibiotic soap"  Reports frequent hot flashes They were much worse last year up to a couple months ago Period is irregular, sometimes does not have it for up to two months Mother went through menopause at 75-68 years of age Occasional vaginal dryness, does endorse difficulty with lubrication during intercourse Reports low libido  Reports she has seen blood on and off in her stool Reports she has had weight gain Endorses early satiety Denies hemorrhoids or hard stools No family hx of colon cancer Mom diagnosed with breast cancer at 78, grandmother in 76's, and maternal aunt in 30's   Health Maintenance Due  Topic Date Due   COVID-19 Vaccine (1) Never done   DTaP/Tdap/Td  (1 - Tdap) Never done   PAP SMEAR-Modifier  03/27/2022    Past Medical History:  Diagnosis Date   Headache(784.0)    Infection    UTI   Medical history non-contributory     Past Surgical History:  Procedure Laterality Date   CESAREAN SECTION N/A 12/11/2012   Procedure: CESAREAN SECTION;  Surgeon: Geryl Rankins, MD;  Location: [facility];  Service: Obstetrics;  Laterality: N/A;   NO PAST SURGERIES     SHOULDER SURGERY Right 09/28/2018    The following portions of the patient's history were reviewed and updated as appropriate: allergies, current medications, past family history, past medical history, past social history, past surgical history and problem list.   Health Maintenance:   Last pap: Lab Results  Component Value Date   DIAGPAP  03/28/2019    - Negative for intraepithelial lesion or malignancy (NILM)   HPVHIGH Negative 03/28/2019    Last mammogram:  N/a    Review of Systems:  Pertinent items noted in HPI and remainder of comprehensive ROS otherwise negative.  Physical Exam:  BP 112/77   Pulse 87   Ht 4\' 11"  (1.499 m)   Wt 173 lb 1.6 oz (78.5 kg)   LMP 04/10/2022 (Approximate) Comment: having irregular cycles for last 6 months.  BMI 34.96 kg/m  Physical Exam Vitals reviewed.  Constitutional:      General: She is not in acute distress.    Appearance: Normal appearance. She is not ill-appearing, toxic-appearing or diaphoretic.  HENT:     Head: Normocephalic and atraumatic.     Mouth/Throat:     Mouth: Mucous membranes are moist.  Eyes:     General: No scleral icterus. Pulmonary:     Effort: Pulmonary effort is normal. No respiratory distress.  Abdominal:     General: Abdomen is flat.     Palpations: Abdomen is soft.     Comments: Tender to palpation in suprapubic and RLQ. Some mild fullness suprapubically  Genitourinary:    General: Normal vulva.     Vagina: No vaginal discharge.     Comments: Normal external genitalia. Normal urethral meatus. Normal  vaginal mucosa, cervix poorly visualized. Bimanual with no significant fullness in either adnexa, uterus feels mid position and mildly enlarged Musculoskeletal:     Comments: No tenderness of lumbar paraspinals, mildly tender in R lower back  Skin:    General: Skin is warm and dry.  Neurological:     General: No focal deficit present.     Mental Status: She is alert.  Psychiatric:        Mood and Affect: Mood normal.        Behavior: Behavior normal.      Labs and Imaging No results found for this or any previous visit (from the past 168 hour(s)). No results found.    Assessment and Plan:   Problem List Items Addressed This Visit       Genitourinary   Recurrent UTI    Symptoms c/w culture proven prior episodes. Discussed prophylactic therapy given this is her fourth episode this year, she is amenable. Empiric macrobid therapy based on prior culture results to be followed by [facility] keflex therapy. Will check Ucx and vaginal swab as well. Also sent diflucan due to hx of recurrent yeast infections with antibiotic regimens.       Relevant Medications   nitrofurantoin, macrocrystal-monohydrate, (MACROBID) 100 MG capsule   fluconazole (DIFLUCAN) 150 MG tablet   cephALEXin (KEFLEX) 250 MG capsule   Other Relevant Orders   Urine Culture     Other   Menopausal symptom - Primary    Gives hx concerning for POI and has a family hx of such as well. Will do basic labs. This may be contributing to her other symptoms, especially recurrent UTI though no significant vaginal dryness was seen on exam.      Relevant Orders   FSH   Estradiol   Beta hCG quant (ref lab)   TSH Rfx on Abnormal to Free T4   Prolactin   Right flank pain    Unclear etiology, does not appear to be MSK on exam. Low suspicion for kidney etiology as this is an uncommon presenting symptoms but will check CMP. Does report some blood in stool and early satiety, no family hx of colon cancer but will get IFOB to evaluate  further. Also given exam with mildly enlarged uterus will get pelvic US to see if this is contributing.       Relevant Orders   Comp Met (CMET)   US Pelvis Complete   Other Visit Diagnoses     Vaginal itching       Relevant Orders   Cervicovaginal ancillary only( [place])   Bloody stool       Relevant Orders   CBC   Fecal occult blood, imunochemical([facility])       Routine preventative health maintenance measures emphasized. Please refer to After Visit Summary for other counseling recommendations.   Return in about 3  months (around 09/22/2022).    Total face-to-face time with patient: 30 minutes.  Over 50% of encounter was spent on counseling and coordination of care.   Venora Maples, MD/MPH Attending Family Medicine Physician, [facility] for [facility], [facility]

## 2022-06-23 NOTE — Assessment & Plan Note (Addendum)
Symptoms c/w culture proven prior episodes. Discussed prophylactic therapy given this is her fourth episode this year, she is amenable. Empiric macrobid therapy based on prior culture results to be followed by [facility] keflex therapy. Will check Ucx and vaginal swab as well. Also sent diflucan due to hx of recurrent yeast infections with antibiotic regimens.

## 2022-06-23 NOTE — Assessment & Plan Note (Signed)
Unclear etiology, does not appear to be MSK on exam. Low suspicion for kidney etiology as this is an uncommon presenting symptoms but will check CMP. Does report some blood in stool and early satiety, no family hx of colon cancer but will get IFOB to evaluate further. Also given exam with mildly enlarged uterus will get pelvic US to see if this is contributing.

## 2022-06-24 ENCOUNTER — Encounter: Payer: Self-pay | Admitting: Family Medicine

## 2022-06-24 ENCOUNTER — Telehealth: Payer: Self-pay | Admitting: Family Medicine

## 2022-06-24 DIAGNOSIS — E221 Hyperprolactinemia: Secondary | ICD-10-CM

## 2022-06-24 LAB — COMPREHENSIVE METABOLIC PANEL
ALT: 15 IU/L (ref 0–32)
AST: 18 IU/L (ref 0–40)
Albumin/Globulin Ratio: 2 (ref 1.2–2.2)
Albumin: 4.5 g/dL (ref 3.9–4.9)
Alkaline Phosphatase: 59 IU/L (ref 44–121)
BUN/Creatinine Ratio: 12 (ref 9–23)
BUN: 8 mg/dL (ref 6–20)
Bilirubin Total: 0.3 mg/dL (ref 0.0–1.2)
CO2: 19 mmol/L — ABNORMAL LOW (ref 20–29)
Calcium: 9.4 mg/dL (ref 8.7–10.2)
Chloride: 103 mmol/L (ref 96–106)
Creatinine, Ser: 0.68 mg/dL (ref 0.57–1.00)
Globulin, Total: 2.3 g/dL (ref 1.5–4.5)
Glucose: 70 mg/dL (ref 70–99)
Potassium: 4.2 mmol/L (ref 3.5–5.2)
Sodium: 137 mmol/L (ref 134–144)
Total Protein: 6.8 g/dL (ref 6.0–8.5)
eGFR: 115 mL/min/{1.73_m2} (ref >59)

## 2022-06-24 LAB — CERVICOVAGINAL ANCILLARY ONLY
Bacterial Vaginitis (gardnerella): NEGATIVE
Candida Glabrata: NEGATIVE
Candida Vaginitis: POSITIVE — AB
Chlamydia: NEGATIVE
Comment: NEGATIVE
Comment: NEGATIVE
Comment: NEGATIVE
Comment: NEGATIVE
Comment: NEGATIVE
Comment: NORMAL
Neisseria Gonorrhea: NEGATIVE
Trichomonas: NEGATIVE

## 2022-06-24 LAB — CBC
Hematocrit: 38.2 % (ref 34.0–46.6)
Hemoglobin: 12 g/dL (ref 11.1–15.9)
MCH: 25.1 pg — ABNORMAL LOW (ref 26.6–33.0)
MCHC: 31.4 g/dL — ABNORMAL LOW (ref 31.5–35.7)
MCV: 80 fL (ref 79–97)
Platelets: 307 10*3/uL (ref 150–450)
RBC: 4.78 x10E6/uL (ref 3.77–5.28)
RDW: 13.5 % (ref 11.7–15.4)
WBC: 6 10*3/uL (ref 3.4–10.8)

## 2022-06-24 LAB — ESTRADIOL: Estradiol: 146 pg/mL

## 2022-06-24 LAB — FOLLICLE STIMULATING HORMONE: FSH: 9 m[IU]/mL

## 2022-06-24 LAB — PROLACTIN: Prolactin: 54 ng/mL — ABNORMAL HIGH (ref 4.8–33.4)

## 2022-06-24 LAB — TSH RFX ON ABNORMAL TO FREE T4: TSH: 0.991 u[IU]/mL (ref 0.450–4.500)

## 2022-06-24 LAB — BETA HCG QUANT (REF LAB): hCG Quant: <1 m[IU]/mL

## 2022-06-24 MED ORDER — CABERGOLINE 0.5 MG PO TABS: 0.2500 mg | ORAL_TABLET | ORAL | 0 refills | Status: DC

## 2022-06-24 NOTE — Telephone Encounter (Signed)
Called patient and confirmed identity with two markers.   Discussed results.  CMP unremarkable, no issues with her kidneys HCG normal TSH normal CBC normal FSH and estradiol normal Prolactin mildly elevated at 54  We discussed that hyperprolactinemia may be the cause of her symptoms, especially if it was making her sufficiently hypo-estrogenic to cause her menopausal symptoms. Given low elevation <200 I did not recommend MRI, however patient then reported (she had not mentioned this at our last visit) daily migraine headaches. Will proceed with MRI, endocrinology referral.  Also discussed initiating cabergoline for a diagnostic therapeutic trial. Also emphasized that this may cause a marked increase in fertility and that she should be careful (has not used protection for many years). She asked regarding safety of medicine if she does get pregnant, discussed it is acceptable to use in pregnancy. Also discussed nausea as common side effect. Patient in agreement to start treatment.   Clinical staff please coordinate MRI and Endocrinology referral, orders have been placed.

## 2022-06-24 NOTE — Telephone Encounter (Signed)
Calling to get test results

## 2022-06-24 NOTE — Telephone Encounter (Signed)
Addressed in My Chart message

## 2022-06-26 ENCOUNTER — Encounter: Payer: Self-pay | Admitting: Family Medicine

## 2022-06-26 LAB — URINE CULTURE

## 2022-06-27 ENCOUNTER — Other Ambulatory Visit: Payer: Self-pay | Admitting: Obstetrics and Gynecology

## 2022-06-27 ENCOUNTER — Ambulatory Visit (HOSPITAL_BASED_OUTPATIENT_CLINIC_OR_DEPARTMENT_OTHER)
Admission: RE | Admit: 2022-06-27 | Discharge: 2022-06-27 | Disposition: A | Discharge status: Home / Self Care | Payer: Medicaid Other | Source: Ambulatory Visit | Attending: Family Medicine | Admitting: Family Medicine

## 2022-06-27 ENCOUNTER — Other Ambulatory Visit: Payer: Self-pay | Admitting: Family Medicine

## 2022-06-27 DIAGNOSIS — N898 Other specified noninflammatory disorders of vagina: Secondary | ICD-10-CM

## 2022-06-27 DIAGNOSIS — R109 Unspecified abdominal pain: Secondary | ICD-10-CM

## 2022-06-27 DIAGNOSIS — N951 Menopausal and female climacteric states: Secondary | ICD-10-CM

## 2022-06-27 DIAGNOSIS — N390 Urinary tract infection, site not specified: Secondary | ICD-10-CM

## 2022-06-27 DIAGNOSIS — N926 Irregular menstruation, unspecified: Secondary | ICD-10-CM | POA: Diagnosis not present

## 2022-06-27 DIAGNOSIS — K921 Melena: Secondary | ICD-10-CM

## 2022-06-29 ENCOUNTER — Encounter: Payer: Self-pay | Admitting: Family Medicine

## 2022-07-01 ENCOUNTER — Telehealth: Payer: Self-pay | Admitting: Lactation Services

## 2022-07-01 NOTE — Telephone Encounter (Signed)
Per chart review, patient with UTI and has not read her My chart message. She reports she did see message and has taken ATB for treatment.

## 2022-07-02 ENCOUNTER — Ambulatory Visit (HOSPITAL_COMMUNITY)
Admission: RE | Admit: 2022-07-02 | Discharge: 2022-07-02 | Disposition: A | Discharge status: Home / Self Care | Payer: Medicaid Other | Source: Ambulatory Visit | Attending: Family Medicine | Admitting: Family Medicine

## 2022-07-02 DIAGNOSIS — E221 Hyperprolactinemia: Secondary | ICD-10-CM | POA: Insufficient documentation

## 2022-07-02 DIAGNOSIS — G9389 Other specified disorders of brain: Secondary | ICD-10-CM | POA: Diagnosis not present

## 2022-07-02 DIAGNOSIS — R519 Headache, unspecified: Secondary | ICD-10-CM | POA: Diagnosis not present

## 2022-07-02 MED ORDER — GADOBUTROL 1 MMOL/ML IV SOLN: 8.0000 mL | Freq: Once | INTRAVENOUS | Status: DC | PRN

## 2022-07-02 MED ORDER — GADOBUTROL 1 MMOL/ML IV SOLN
8.0000 mL | Freq: Once | INTRAVENOUS | Status: AC | PRN
  Administered 2022-07-02: 8 mL via INTRAVENOUS

## 2022-07-06 ENCOUNTER — Encounter: Payer: Self-pay | Admitting: Family Medicine

## 2022-07-06 DIAGNOSIS — R519 Headache, unspecified: Secondary | ICD-10-CM

## 2022-07-06 MED ORDER — CYCLOBENZAPRINE HCL 5 MG PO TABS
5.0000 mg | ORAL_TABLET | Freq: Three times a day (TID) | ORAL | 0 refills | Status: AC | PRN
Start: 2022-07-06 — End: ?

## 2022-07-08 ENCOUNTER — Encounter: Payer: Self-pay | Admitting: Family Medicine

## 2022-07-22 ENCOUNTER — Other Ambulatory Visit: Payer: Self-pay | Admitting: *Deleted

## 2022-07-22 DIAGNOSIS — B379 Candidiasis, unspecified: Secondary | ICD-10-CM

## 2022-07-22 MED ORDER — FLUCONAZOLE 150 MG PO TABS
150.0000 mg | ORAL_TABLET | Freq: Once | ORAL | 0 refills | Status: AC
Start: 2022-07-22 — End: 2022-07-22

## 2022-07-23 ENCOUNTER — Encounter: Payer: Self-pay | Admitting: Family Medicine

## 2022-07-24 ENCOUNTER — Telehealth: Payer: Self-pay | Admitting: *Deleted

## 2022-07-24 NOTE — Telephone Encounter (Signed)
Patient called front desk upset with MyChart message re: migraines/ FMLA . Front desk asked nurse to call patient. I called Jackie Brooks and she reports that she talked with Dr. Crissie Reese about her migraines and he gave her meds and she doesn't understand why he can't do FMLA. I explained I did not see migraines discussed in last visit but that Dr. Crissie Reese is a Family Medicine MD and can treat migraines. I explained most of our doctors are OB/GYN and we are not primary care. I explained I can send a message to Dr. Crissie Reese to see if he is comfortable with our office completing a FMLA for her for migraines but that she may need to be seen in office again first. She states it is not just that but that she can't take the medicine he prescribed when she works because it makes her drowsy and feel like she will fall over. She wants Dr. Crissie Reese himself to call her to discuss. I informed her I will send the message. She voices understanding.[name]

## 2022-07-28 NOTE — Telephone Encounter (Signed)
Called patient back.  Reports she has had debilitating migraines for months, has to take breaks at work all the time and it was affecting her performance. Has been using flexeril that was prescribed at last visit for this but it makes her sleepy so she wants to take some FMLA.  I told her that I was fine to sign off on some FMLA but I have been rejected in the past despite being FM boarded because I work at an [employer] clinic. Recommended she get a PCP to have ongoing care for this issue.  Also recommended given duration and severity of her migraines that she see a Neurologist, which she is in agreement with. Also discussed her concern that her pituitary adenoma was related, discussed that this seems unlikely given its size as well as lack of improvement after one month on cabergoline and presumed shrinking of it.   Patient will bring FMLA paper in to be completed.

## 2022-08-25 ENCOUNTER — Telehealth: Payer: Self-pay

## 2022-08-25 ENCOUNTER — Other Ambulatory Visit: Payer: Self-pay

## 2022-08-25 DIAGNOSIS — D500 Iron deficiency anemia secondary to blood loss (chronic): Secondary | ICD-10-CM | POA: Diagnosis not present

## 2022-08-25 DIAGNOSIS — F4323 Adjustment disorder with mixed anxiety and depressed mood: Secondary | ICD-10-CM | POA: Diagnosis not present

## 2022-08-25 DIAGNOSIS — R5383 Other fatigue: Secondary | ICD-10-CM | POA: Diagnosis not present

## 2022-08-25 DIAGNOSIS — E221 Hyperprolactinemia: Secondary | ICD-10-CM

## 2022-08-25 DIAGNOSIS — G43E11 Chronic migraine with aura, intractable, with status migrainosus: Secondary | ICD-10-CM | POA: Diagnosis not present

## 2022-08-25 DIAGNOSIS — D497 Neoplasm of unspecified behavior of endocrine glands and other parts of nervous system: Secondary | ICD-10-CM | POA: Diagnosis not present

## 2022-08-25 DIAGNOSIS — R232 Flushing: Secondary | ICD-10-CM | POA: Diagnosis not present

## 2022-08-25 NOTE — Telephone Encounter (Signed)
Jackie Brooks Jackie Brooks, CMA  ?

## 2022-08-26 ENCOUNTER — Other Ambulatory Visit (INDEPENDENT_AMBULATORY_CARE_PROVIDER_SITE_OTHER): Payer: Medicaid Other

## 2022-08-26 ENCOUNTER — Encounter: Payer: Self-pay | Admitting: Family Medicine

## 2022-08-26 DIAGNOSIS — E221 Hyperprolactinemia: Secondary | ICD-10-CM | POA: Diagnosis not present

## 2022-08-26 LAB — BASIC METABOLIC PANEL
BUN: 9 mg/dL (ref 6–23)
CO2: 27 mEq/L (ref 19–32)
Calcium: 10.1 mg/dL (ref 8.4–10.5)
Chloride: 105 mEq/L (ref 96–112)
Creatinine, Ser: 0.71 mg/dL (ref 0.40–1.20)
GFR: 108.16 mL/min (ref >60.00)
Glucose, Bld: 107 mg/dL — ABNORMAL HIGH (ref 70–99)
Potassium: 3.9 mEq/L (ref 3.5–5.1)
Sodium: 138 mEq/L (ref 135–145)

## 2022-08-26 LAB — LUTEINIZING HORMONE: LH: 49.21 m[IU]/mL

## 2022-08-26 LAB — CORTISOL: Cortisol, Plasma: 17.2 ug/dL

## 2022-08-26 LAB — FOLLICLE STIMULATING HORMONE: FSH: 55.3 m[IU]/mL

## 2022-08-26 LAB — TSH: TSH: 1.15 u[IU]/mL (ref 0.35–5.50)

## 2022-08-26 LAB — T4, FREE: Free T4: 0.81 ng/dL (ref 0.60–1.60)

## 2022-08-28 LAB — ESTRADIOL: Estradiol: <15 pg/mL

## 2022-08-29 LAB — ACTH: C206 ACTH: 7 pg/mL (ref 6–50)

## 2022-08-31 ENCOUNTER — Encounter: Payer: Self-pay | Admitting: Family Medicine

## 2022-08-31 LAB — INSULIN-LIKE GROWTH FACTOR
IGF-I, LC/MS: 207 ng/mL (ref 53–331)
Z-Score (Female): 0.8 SD (ref ?–2.0)

## 2022-08-31 LAB — PROLACTIN: Prolactin: 1 ng/mL — ABNORMAL LOW

## 2022-08-31 LAB — GROWTH HORMONE: Growth Hormone: 0.1 ng/mL (ref <=7.1)

## 2022-09-02 ENCOUNTER — Encounter: Payer: Self-pay | Admitting: "Endocrinology

## 2022-09-02 ENCOUNTER — Ambulatory Visit (INDEPENDENT_AMBULATORY_CARE_PROVIDER_SITE_OTHER): Payer: Medicaid Other | Admitting: "Endocrinology

## 2022-09-02 VITALS — BP 100/65 | HR 92 | Ht 59.0 in | Wt 176.0 lb

## 2022-09-02 DIAGNOSIS — E236 Other disorders of pituitary gland: Secondary | ICD-10-CM

## 2022-09-02 DIAGNOSIS — R635 Abnormal weight gain: Secondary | ICD-10-CM

## 2022-09-02 DIAGNOSIS — E221 Hyperprolactinemia: Secondary | ICD-10-CM | POA: Diagnosis not present

## 2022-09-02 DIAGNOSIS — R232 Flushing: Secondary | ICD-10-CM

## 2022-09-02 NOTE — Progress Notes (Signed)
Outpatient Endocrinology Note [place], MD    Jackie Brooks 1986/01/31 [id number]  Referring Provider: Venora Maples, MD Primary Care Provider: Shade Flood, MD Reason for consultation: Subjective   Assessment & Plan  Jackie Brooks was seen today for hyperlolactinemia.  Diagnoses and all orders for this visit:  Hyperprolactinemia (HCC) -     PROLACTIN W/DILUTION; Future -     Anti-Mullerian Hormone [facility]), Female  Rathke's cleft cyst (HCC)  Weight gain -     Hemoglobin A1c -     Lipid panel  Hot flashes -     Anti-Mullerian Hormone [facility]), Female -     Follicle stimulating hormone; Future -     Estradiol; Future -     Luteinizing hormone; Future  Elevated prolactin of 54 found on 06/23/22 07/02/22 MRI brain W/WO demonstrated small (3 mm) Rathke's cleft cyst but reported pituitary microadenoma is difficult to exclude No galactorrhea but noted breast enlargement since 07/2022. Menarche at 10-10 and were regular until 01/2023. Now are irregular. Started cabergoline 0.25 mg weekly and did not tolerate it well Subsequent prolactin is 1 Stopped cabergoline, will follow with prolactin with dilution at 8 am  Pt c/o 20 lbs weight gain in a month Will follow with A1C, lipids and 1mg  dexamethasone suppression test  TSH, FT4 WNL, not on thyroid medication Non-cushingoid features  ACTH low normal at 7 with 9:30am cortisol at 17  Pt is experiencing hot flashes Had unremarkable estradiol and FSH in 05/2022, now both in menopausal range Unsure if cabergoline has any role (although never reported previously), will stop medication and repeat labs Will also do AMH to assess antral reserve  Pt to follow up with ob-gyn    Return for Needs 8 am lab appointment in 2 weeks, and again 1 week later. Follow up with me on Aug 16.   I have reviewed current medications, nurse's notes, allergies, vital signs, past medical and surgical history, family medical history, and social  history for this encounter. Counseled patient on symptoms, examination findings, lab findings, imaging results, treatment decisions and monitoring and prognosis. The patient understood the recommendations and agrees with the treatment plan. All questions regarding treatment plan were fully answered.  Jackie [place], MD  09/02/22   History of Present Illness HPI   Jackie Brooks is a 37 y.o. female referred by Dr. Crissie Reese for evaluation and management of hyperprolactinemia.   This was discovered by elevated prolactin of 54 on 06/23/22. 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  C/o weight gain and migraine head ache starting in 2024. Has dizziness, head aches and "seeing fireworks" episode this week.   No galactorrhea but noted breast enlargement since 07/2022. Menarche at 10-10 and were regular until 01/2023. Now are irregular.   Started cabergoline 0.25 mg weekly and did not tolerate it well.   C/o body aches and hot flashes.  C/p 20 lbs weight gain 2 mo, unintentional although c/o a lot of stress and poor sleep.   Physical Exam  BP 100/65   Pulse 92   Ht 4\' 11"  (1.499 m)   Wt 176 lb (79.8 kg)   SpO2 98%   BMI 35.55 kg/m    Constitutional: well developed, well nourished Head: normocephalic, atraumatic Eyes: sclera anicteric, no redness Neck: supple Lungs: normal  respiratory effort Neurology: alert and oriented Skin: dry, no appreciable rashes Musculoskeletal: no appreciable defects Psychiatric: normal mood and affect   Current Medications Patient's Medications  New Prescriptions   No medications on file  Previous Medications   ACETAMINOPHEN (TYLENOL) 650 MG CR TABLET    Take 650 mg by mouth every 8 (eight) hours as needed for pain.   CEPHALEXIN (KEFLEX) 250  MG CAPSULE    Take 1 capsule (250 mg total) by mouth at bedtime.   CYCLOBENZAPRINE (FLEXERIL) 5 MG TABLET    Take 1 tablet (5 mg total) by mouth 3 (three) times daily as needed for muscle spasms.   NITROFURANTOIN, MACROCRYSTAL-MONOHYDRATE, (MACROBID) 100 MG CAPSULE    Take 1 capsule (100 mg total) by mouth 2 (two) times daily.   RIZATRIPTAN (MAXALT) 10 MG TABLET    Take 10 mg by mouth as needed for migraine (Take one tablet (10 mg dose) by mouth once as needed for Migraine for up to 1 dose. May repeat in 2 hours if needed). May repeat in 2 hours if needed  Modified Medications   No medications on file  Discontinued Medications   CABERGOLINE (DOSTINEX) 0.5 MG TABLET    Take 0.5 tablets (0.25 mg total) by mouth once a week.   RIZATRIPTAN (MAXALT) 10 MG TABLET    Take 10 mg by mouth as needed (Take one tablet (10 mg dose) by mouth once as needed for Migraine for up to 1 dose. May repeat in 2 hours if needed).    Allergies No Known Allergies  Past Medical History Past Medical History:  Diagnosis Date   Headache(784.0)    Infection    UTI   Medical history non-contributory     Past Surgical History Past Surgical History:  Procedure Laterality Date   CESAREAN SECTION N/A 12/11/2012   Procedure: CESAREAN SECTION;  Surgeon: Geryl Rankins, MD;  Location: [facility];  Service: Obstetrics;  Laterality: N/A;   NO PAST SURGERIES     SHOULDER SURGERY Right 09/28/2018    Family History family history includes Cancer in her paternal aunt and paternal grandmother; Diabetes in her father and mother; Hypertension in her mother.  Social History Social History   Socioeconomic History   Marital status: Single    Spouse name: Not on file   Number of children: Not on file   Years of education: Not on file   Highest education level: Not on file  Occupational History   Not on file  Tobacco Use   Smoking status: Never   Smokeless tobacco: Never  Vaping Use   Vaping Use: Never used  Substance and  Sexual Activity   Alcohol use: No   Drug use: No   Sexual activity: Yes    Birth control/protection: None    Comment: 4 months agomonths ago  Other Topics Concern   Not on file  Social History Narrative   ** Merged History Encounter **       Social Determinants of Health   Financial Resource Strain: Not on file  Food Insecurity: No Food Insecurity (06/23/2022)   Hunger Vital Sign    Worried About Running Out of Food in the Last Year: Never true    Ran Out of Food in the Last Year: Never true  Transportation Needs: No Transportation Needs (06/23/2022)   PRAPARE - Administrator, Civil Service (Medical): No    Lack of Transportation (Non-Medical): No  Physical Activity: Not on file  Stress: Not on file  Social Connections: Not on file  Intimate Partner Violence: Not on file    No results found for: "CHOL" No results found for: "HDL" No results found for: "LDLCALC" No results found for: "TRIG" No results found for: "CHOLHDL" Lab Results  Component Value Date   CREATININE 0.71 08/26/2022   Lab Results  Component Value Date   GFR 108.16 08/26/2022      Component Value Date/Time   NA 138 08/26/2022 0932   NA 137 06/23/2022 1449   K 3.9 08/26/2022 0932   CL 105 08/26/2022 0932   CO2 27 08/26/2022 0932   GLUCOSE 107 (H) 08/26/2022 0932   BUN 9 08/26/2022 0932   BUN 8 06/23/2022 1449   CREATININE 0.71 08/26/2022 0932   CALCIUM 10.1 08/26/2022 0932   PROT 6.8 06/23/2022 1449   ALBUMIN 4.5 06/23/2022 1449   AST 18 06/23/2022 1449   ALT 15 06/23/2022 1449   ALKPHOS 59 06/23/2022 1449   BILITOT 0.3 06/23/2022 1449   GFRNONAA >60 10/16/2020 1344   GFRAA 103 11/28/2018 1733      Latest Ref Rng & Units 08/26/2022    9:32 AM 06/23/2022    2:49 PM 10/16/2020    1:44 PM  BMP  Glucose 70 - 99 mg/dL 098  70  84   BUN 6 - 23 mg/dL 9  8  15    Creatinine 0.40 - 1.20 mg/dL 1.19  1.47  8.29   BUN/Creat Ratio 9 - 23  12    Sodium 135 - 145 mEq/L 138  137  139    Potassium 3.5 - 5.1 mEq/L 3.9  4.2  4.4   Chloride 96 - 112 mEq/L 105  103  106   CO2 19 - 32 mEq/L 27  19  26    Calcium 8.4 - 10.5 mg/dL 56.2  9.4  9.6        Component Value Date/Time   WBC 6.0 06/23/2022 1449   WBC 6.1 10/16/2020 1344   RBC 4.78 06/23/2022 1449   RBC 5.02 10/16/2020 1344   HGB 12.0 06/23/2022 1449   HCT 38.2 06/23/2022 1449   PLT 307 06/23/2022 1449   MCV 80 06/23/2022 1449   MCH 25.1 (L) 06/23/2022 1449   MCH 25.9 (L) 10/16/2020 1344   MCHC 31.4 (L) 06/23/2022 1449   MCHC 31.6 10/16/2020 1344   RDW 13.5 06/23/2022 1449   LYMPHSABS 1.5 10/16/2020 1344   MONOABS 0.4 10/16/2020 1344   EOSABS 0.2 10/16/2020 1344   BASOSABS 0.1 10/16/2020 1344   Lab Results  Component Value Date   TSH 1.15 08/26/2022   TSH 0.991 06/23/2022   TSH 0.861 11/16/2018   FREET4 0.81 08/26/2022         Parts of this note may have been dictated using voice recognition software. There may be variances in spelling and vocabulary which are unintentional. Not all errors are proofread. Please notify the Thereasa Parkin if any discrepancies are noted or if the meaning of any statement is not clear.

## 2022-09-04 ENCOUNTER — Encounter: Payer: Self-pay | Admitting: Family Medicine

## 2022-09-07 ENCOUNTER — Encounter: Payer: Self-pay | Admitting: Family Medicine

## 2022-09-08 ENCOUNTER — Telehealth: Payer: Self-pay | Admitting: Family Medicine

## 2022-09-08 DIAGNOSIS — G43E11 Chronic migraine with aura, intractable, with status migrainosus: Secondary | ICD-10-CM | POA: Diagnosis not present

## 2022-09-08 DIAGNOSIS — R232 Flushing: Secondary | ICD-10-CM | POA: Diagnosis not present

## 2022-09-08 DIAGNOSIS — E221 Hyperprolactinemia: Secondary | ICD-10-CM | POA: Diagnosis not present

## 2022-09-08 DIAGNOSIS — Z599 Problem related to housing and economic circumstances, unspecified: Secondary | ICD-10-CM | POA: Diagnosis not present

## 2022-09-08 DIAGNOSIS — E559 Vitamin D deficiency, unspecified: Secondary | ICD-10-CM | POA: Diagnosis not present

## 2022-09-08 DIAGNOSIS — E236 Other disorders of pituitary gland: Secondary | ICD-10-CM | POA: Diagnosis not present

## 2022-09-08 NOTE — Telephone Encounter (Signed)
I had a conversation with Jackie Brooks regarding her paperwork. According to her, [employer] hadn't received the necessary documents. I assured her that I would provide office notes and medication details. In response, she mentioned that she plans to visit the office to collect a copy

## 2022-09-09 NOTE — Telephone Encounter (Signed)
 Open in error

## 2022-09-16 ENCOUNTER — Other Ambulatory Visit: Payer: Self-pay

## 2022-09-16 ENCOUNTER — Other Ambulatory Visit (INDEPENDENT_AMBULATORY_CARE_PROVIDER_SITE_OTHER): Payer: Medicaid Other

## 2022-09-16 DIAGNOSIS — R232 Flushing: Secondary | ICD-10-CM

## 2022-09-16 DIAGNOSIS — E221 Hyperprolactinemia: Secondary | ICD-10-CM

## 2022-09-25 ENCOUNTER — Other Ambulatory Visit: Payer: Medicaid Other

## 2022-09-28 ENCOUNTER — Other Ambulatory Visit (INDEPENDENT_AMBULATORY_CARE_PROVIDER_SITE_OTHER): Payer: Medicaid Other

## 2022-09-28 DIAGNOSIS — R232 Flushing: Secondary | ICD-10-CM

## 2022-09-28 DIAGNOSIS — E221 Hyperprolactinemia: Secondary | ICD-10-CM | POA: Diagnosis not present

## 2022-09-29 DIAGNOSIS — Z599 Problem related to housing and economic circumstances, unspecified: Secondary | ICD-10-CM | POA: Diagnosis not present

## 2022-09-29 DIAGNOSIS — G43E11 Chronic migraine with aura, intractable, with status migrainosus: Secondary | ICD-10-CM | POA: Diagnosis not present

## 2022-09-29 DIAGNOSIS — E236 Other disorders of pituitary gland: Secondary | ICD-10-CM | POA: Diagnosis not present

## 2022-09-29 DIAGNOSIS — E349 Endocrine disorder, unspecified: Secondary | ICD-10-CM | POA: Diagnosis not present

## 2022-09-29 DIAGNOSIS — R232 Flushing: Secondary | ICD-10-CM | POA: Diagnosis not present

## 2022-09-29 DIAGNOSIS — E221 Hyperprolactinemia: Secondary | ICD-10-CM | POA: Diagnosis not present

## 2022-10-09 ENCOUNTER — Encounter: Payer: Self-pay | Admitting: "Endocrinology

## 2022-10-09 ENCOUNTER — Ambulatory Visit: Payer: Medicaid Other | Admitting: "Endocrinology

## 2022-10-09 VITALS — BP 100/70 | HR 105 | Ht 59.0 in | Wt 181.6 lb

## 2022-10-09 DIAGNOSIS — E236 Other disorders of pituitary gland: Secondary | ICD-10-CM | POA: Diagnosis not present

## 2022-10-09 DIAGNOSIS — Z8639 Personal history of other endocrine, nutritional and metabolic disease: Secondary | ICD-10-CM | POA: Diagnosis not present

## 2022-10-09 DIAGNOSIS — R232 Flushing: Secondary | ICD-10-CM

## 2022-10-09 NOTE — Progress Notes (Signed)
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 Menarche at 10-11 and were regular until 01/2023. Now are irregular. Started cabergoline 0.25 mg weekly and did not tolerate it well Subsequent prolactin is 1 Stopped cabergoline, will follow with prolactin with dilution normal at 5.9 on 09/28/22 Continue monitoring clinically  Pt c/o 20 lbs weight gain in a month TSH, FT4 WNL, not on thyroid medication Non-cushingoid features  ACTH low normal at 7 with 9:30am cortisol at 17  Pt is experiencing hot flashes Had unremarkable estradiol and FSH in 05/2022, now both in menopausal range Unsure if cabergoline has any role (although never reported previously), will stop medication and repeat labs Will also do AMH to assess antral reserve: not done Patient has failed to conceive after trying for about 1 year, last 2 LH and FSH have come back elevated in the menopausal range (history of hot flashes)  Start birth control pill by ob-gyn on 10/07/22 Referred to [facility]  Return in about 6 months (around 04/11/2023).   I have reviewed current medications, nurse's notes, allergies, vital signs, past medical and surgical history, family medical history, and social history for this encounter. Counseled patient on symptoms, examination findings, lab findings, imaging results, treatment decisions and monitoring and  prognosis. The patient understood the recommendations and agrees with the treatment plan. All questions regarding treatment plan were fully answered.  Jackie , MD  10/09/22   History of Present Illness HPI   Jackie Brooks is a 37 y.o. female referred by Dr. Neva Seat for follow up on hyperprolactinemia.   Interval history  Patient continues to complain of headaches with visual flashes before, no triggers found Patient also complains of some dizziness specially when reaching out Blood pressure was found low previously, today blood pressure is at 100/70, not on any blood pressure medications Repeat prolactin with dilution off of cabergoline came back normal Patient has been trying for fertility for the past 6 to 12 months without success Started on birth control pill by her OB/GYN on 10/07/2022  Initial history  This was discovered by elevated prolactin of 54 on 06/23/22. 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  C/o weight gain and migraine head ache starting in 2024. Has dizziness, head aches and "seeing fireworks".   No galactorrhea but noted breast enlargement since 07/2022. Menarche at 10-11 and were regular until 01/2023. Now are irregular.   Started cabergoline 0.25 mg weekly and did not tolerate it well. Last taken 09/02/22  C/o body aches and hot flashes.  C/p 20 lbs weight gain 2 mo, unintentional although c/o a lot of stress and poor sleep.   Not on broth control: Component     Latest Ref Rng 06/23/2022 08/26/2022 09/28/2022  FSH  mIU/ML 9.0  55.3  47.0   Estradiol     pg/mL 146.0  <15  102   LH     mIU/mL  49.21  78.75      Physical Exam  BP 100/70   Pulse (!) 105   Ht 4\' 11"  (1.499 m)   Wt 181 lb 9.6 oz (82.4 kg)   SpO2 98%   BMI 36.68 kg/m     Constitutional: well developed, well nourished Head: normocephalic, atraumatic Eyes: sclera anicteric, no redness Neck: supple Lungs: normal respiratory effort Neurology: alert and oriented Skin: dry, no appreciable rashes Musculoskeletal: no appreciable defects Psychiatric: normal mood and affect   Current Medications Patient's Medications  New Prescriptions   No medications on file  Previous Medications   ACETAMINOPHEN (TYLENOL) 650 MG CR TABLET    Take 650 mg by mouth every 8 (eight) hours as needed for pain.   CHOLECALCIFEROL (VITAMIN D3) 1.25 MG (50000 UT) CAPS    Take 50,000 Units by mouth once a week.   CYCLOBENZAPRINE (FLEXERIL) 5 MG TABLET    Take 1 tablet (5 mg total) by mouth 3 (three) times daily as needed for muscle spasms.   LEVONORGESTREL (MIRENA) 20 MCG/DAY IUD    1 each by Intrauterine route once.   NITROFURANTOIN, MACROCRYSTAL-MONOHYDRATE, (MACROBID) 100 MG CAPSULE    Take 1 capsule (100 mg total) by mouth 2 (two) times daily.   RIZATRIPTAN (MAXALT) 10 MG TABLET    Take 10 mg by mouth as needed for migraine (Take one tablet (10 mg dose) by mouth once as needed for Migraine for up to 1 dose. May repeat in 2 hours if needed). May repeat in 2 hours if needed  Modified Medications   No medications on file  Discontinued Medications   No medications on file    Allergies No Known Allergies  Past Medical History Past Medical History:  Diagnosis Date   Headache(784.0)    Infection    UTI   Medical history non-contributory     Past Surgical History Past Surgical History:  Procedure Laterality Date   CESAREAN SECTION N/A 12/11/2012   Procedure: CESAREAN SECTION;  Surgeon: Geryl Rankins, MD;  Location: [facility];  Service: Obstetrics;  Laterality: N/A;   NO PAST SURGERIES     SHOULDER SURGERY Right 09/28/2018    Family History family history includes Cancer in her paternal aunt and paternal grandmother; Diabetes in her father and mother; Hypertension in her  mother.  Social History Social History   Socioeconomic History   Marital status: Single    Spouse name: Not on file   Number of children: Not on file   Years of education: Not on file   Highest education level: Not on file  Occupational History   Not on file  Tobacco Use   Smoking status: Never   Smokeless tobacco: Never  Vaping Use   Vaping status: Never Used  Substance and Sexual Activity   Alcohol use: No   Drug use: No   Sexual activity: Yes    Birth control/protection: None    Comment: 4 months agomonths ago  Other Topics Concern   Not on file  Social History Narrative   ** Merged History Encounter **       Social Determinants of Health   Financial Resource Strain: Unknown (03/05/2021)   Received from [employer], [facility]   Overall Financial Resource Strain (CARDIA)    Difficulty of Paying Living Expenses: Patient declined  Food Insecurity: No Food  Insecurity (06/23/2022)   Hunger Vital Sign    Worried About Running Out of Food in the Last Year: Never true    Ran Out of Food in the Last Year: Never true  Transportation Needs: No Transportation Needs (06/23/2022)   PRAPARE - Administrator, Civil Service (Medical): No    Lack of Transportation (Non-Medical): No  Physical Activity: Unknown (03/05/2021)   Received from [facility], [facility]   Exercise Vital Sign    Days of Exercise per Week: Patient declined    Minutes of Exercise per Session: 70 min  Stress: Stress Concern Present (03/05/2021)   Received from [facility], [facility] - Occupational Stress Questionnaire    Feeling of Stress : To some extent  Social Connections: Unknown (06/25/2021)   Received from [facility], [facility]   Social Network    Social Network: Not on file  Intimate Partner Violence: Unknown (05/28/2021)   Received from [facility], [facility]   HITS    Physically Hurt: Not on file    Insult or Talk  Down To: Not on file    Threaten Physical Harm: Not on file    Scream or Curse: Not on file    No results found for: "CHOL" No results found for: "HDL" No results found for: "LDLCALC" No results found for: "TRIG" No results found for: "CHOLHDL" Lab Results  Component Value Date   CREATININE 0.71 08/26/2022   Lab Results  Component Value Date   GFR 108.16 08/26/2022      Component Value Date/Time   NA 138 08/26/2022 0932   NA 137 06/23/2022 1449   K 3.9 08/26/2022 0932   CL 105 08/26/2022 0932   CO2 27 08/26/2022 0932   GLUCOSE 107 (H) 08/26/2022 0932   BUN 9 08/26/2022 0932   BUN 8 06/23/2022 1449   CREATININE 0.71 08/26/2022 0932   CALCIUM 10.1 08/26/2022 0932   PROT 6.8 06/23/2022 1449   ALBUMIN 4.5 06/23/2022 1449   AST 18 06/23/2022 1449   ALT 15 06/23/2022 1449   ALKPHOS 59 06/23/2022 1449   BILITOT 0.3 06/23/2022 1449   GFRNONAA >60 10/16/2020 1344   GFRAA 103 11/28/2018 1733      Latest Ref Rng & Units 08/26/2022    9:32 AM 06/23/2022    2:49 PM 10/16/2020    1:44 PM  BMP  Glucose 70 - 99 mg/dL 784  70  84   BUN 6 - 23 mg/dL 9  8  15    Creatinine 0.40 - 1.20 mg/dL 6.96  2.95  2.84   BUN/Creat Ratio 9 - 23  12    Sodium 135 - 145 mEq/L 138  137  139   Potassium 3.5 - 5.1 mEq/L 3.9  4.2  4.4   Chloride 96 - 112 mEq/L 105  103  106   CO2 19 - 32 mEq/L 27  19  26    Calcium 8.4 - 10.5 mg/dL 13.2  9.4  9.6        Component Value Date/Time   WBC 6.0 06/23/2022 1449   WBC 6.1 10/16/2020 1344   RBC 4.78 06/23/2022 1449   RBC 5.02 10/16/2020 1344   HGB 12.0 06/23/2022 1449   HCT 38.2 06/23/2022 1449   PLT 307 06/23/2022 1449   MCV 80 06/23/2022 1449   MCH 25.1 (L) 06/23/2022 1449   MCH 25.9 (L) 10/16/2020 1344   MCHC 31.4 (L) 06/23/2022 1449  MCHC 31.6 10/16/2020 1344   RDW 13.5 06/23/2022 1449   LYMPHSABS 1.5 10/16/2020 1344   MONOABS 0.4 10/16/2020 1344   EOSABS 0.2 10/16/2020 1344   BASOSABS 0.1 10/16/2020 1344   Lab Results  Component Value  Date   TSH 1.15 08/26/2022   TSH 0.991 06/23/2022   TSH 0.861 11/16/2018   FREET4 0.81 08/26/2022         Parts of this note may have been dictated using voice recognition software. There may be variances in spelling and vocabulary which are unintentional. Not all errors are proofread. Please notify the Thereasa Parkin if any discrepancies are noted or if the meaning of any statement is not clear.

## 2022-10-13 DIAGNOSIS — Z87898 Personal history of other specified conditions: Secondary | ICD-10-CM | POA: Diagnosis not present

## 2022-10-13 DIAGNOSIS — E349 Endocrine disorder, unspecified: Secondary | ICD-10-CM | POA: Diagnosis not present

## 2022-10-13 DIAGNOSIS — M0580 Other rheumatoid arthritis with rheumatoid factor of unspecified site: Secondary | ICD-10-CM | POA: Diagnosis not present

## 2022-10-13 DIAGNOSIS — R5383 Other fatigue: Secondary | ICD-10-CM | POA: Diagnosis not present

## 2022-10-13 DIAGNOSIS — R42 Dizziness and giddiness: Secondary | ICD-10-CM | POA: Diagnosis not present

## 2022-10-13 DIAGNOSIS — G43E11 Chronic migraine with aura, intractable, with status migrainosus: Secondary | ICD-10-CM | POA: Diagnosis not present

## 2022-10-13 DIAGNOSIS — E559 Vitamin D deficiency, unspecified: Secondary | ICD-10-CM | POA: Diagnosis not present

## 2022-10-13 DIAGNOSIS — R232 Flushing: Secondary | ICD-10-CM | POA: Diagnosis not present

## 2022-10-13 DIAGNOSIS — F411 Generalized anxiety disorder: Secondary | ICD-10-CM | POA: Diagnosis not present

## 2022-10-14 DIAGNOSIS — Z6836 Body mass index (BMI) 36.0-36.9, adult: Secondary | ICD-10-CM | POA: Diagnosis not present

## 2022-10-14 DIAGNOSIS — R55 Syncope and collapse: Secondary | ICD-10-CM | POA: Diagnosis not present

## 2022-10-14 DIAGNOSIS — E785 Hyperlipidemia, unspecified: Secondary | ICD-10-CM | POA: Diagnosis not present

## 2022-10-14 DIAGNOSIS — R42 Dizziness and giddiness: Secondary | ICD-10-CM | POA: Diagnosis not present

## 2022-10-14 DIAGNOSIS — E669 Obesity, unspecified: Secondary | ICD-10-CM | POA: Diagnosis not present

## 2022-10-28 DIAGNOSIS — R768 Other specified abnormal immunological findings in serum: Secondary | ICD-10-CM | POA: Diagnosis not present

## 2022-10-28 DIAGNOSIS — D500 Iron deficiency anemia secondary to blood loss (chronic): Secondary | ICD-10-CM | POA: Diagnosis not present

## 2022-10-28 DIAGNOSIS — R42 Dizziness and giddiness: Secondary | ICD-10-CM | POA: Diagnosis not present

## 2022-10-28 DIAGNOSIS — Z87898 Personal history of other specified conditions: Secondary | ICD-10-CM | POA: Diagnosis not present

## 2022-10-28 DIAGNOSIS — G43E11 Chronic migraine with aura, intractable, with status migrainosus: Secondary | ICD-10-CM | POA: Diagnosis not present

## 2022-10-28 DIAGNOSIS — M0580 Other rheumatoid arthritis with rheumatoid factor of unspecified site: Secondary | ICD-10-CM | POA: Diagnosis not present

## 2022-11-11 DIAGNOSIS — R55 Syncope and collapse: Secondary | ICD-10-CM | POA: Diagnosis not present

## 2022-11-11 DIAGNOSIS — Z6836 Body mass index (BMI) 36.0-36.9, adult: Secondary | ICD-10-CM | POA: Diagnosis not present

## 2022-11-11 DIAGNOSIS — R42 Dizziness and giddiness: Secondary | ICD-10-CM | POA: Diagnosis not present

## 2022-11-11 DIAGNOSIS — E669 Obesity, unspecified: Secondary | ICD-10-CM | POA: Diagnosis not present

## 2022-11-11 DIAGNOSIS — E785 Hyperlipidemia, unspecified: Secondary | ICD-10-CM | POA: Diagnosis not present

## 2022-11-30 DIAGNOSIS — R55 Syncope and collapse: Secondary | ICD-10-CM | POA: Diagnosis not present

## 2022-12-23 DIAGNOSIS — D500 Iron deficiency anemia secondary to blood loss (chronic): Secondary | ICD-10-CM | POA: Diagnosis not present

## 2022-12-23 DIAGNOSIS — N898 Other specified noninflammatory disorders of vagina: Secondary | ICD-10-CM | POA: Diagnosis not present

## 2022-12-23 DIAGNOSIS — F411 Generalized anxiety disorder: Secondary | ICD-10-CM | POA: Diagnosis not present

## 2022-12-23 DIAGNOSIS — R300 Dysuria: Secondary | ICD-10-CM | POA: Diagnosis not present

## 2022-12-23 DIAGNOSIS — G43E11 Chronic migraine with aura, intractable, with status migrainosus: Secondary | ICD-10-CM | POA: Diagnosis not present

## 2023-01-20 DIAGNOSIS — N898 Other specified noninflammatory disorders of vagina: Secondary | ICD-10-CM | POA: Diagnosis not present

## 2023-01-20 DIAGNOSIS — M0580 Other rheumatoid arthritis with rheumatoid factor of unspecified site: Secondary | ICD-10-CM | POA: Diagnosis not present

## 2023-01-20 DIAGNOSIS — N9089 Other specified noninflammatory disorders of vulva and perineum: Secondary | ICD-10-CM | POA: Diagnosis not present

## 2023-01-20 DIAGNOSIS — D500 Iron deficiency anemia secondary to blood loss (chronic): Secondary | ICD-10-CM | POA: Diagnosis not present

## 2023-01-20 DIAGNOSIS — G43E09 Chronic migraine with aura, not intractable, without status migrainosus: Secondary | ICD-10-CM | POA: Diagnosis not present

## 2023-03-01 DIAGNOSIS — F4323 Adjustment disorder with mixed anxiety and depressed mood: Secondary | ICD-10-CM | POA: Diagnosis not present

## 2023-03-01 DIAGNOSIS — M0580 Other rheumatoid arthritis with rheumatoid factor of unspecified site: Secondary | ICD-10-CM | POA: Diagnosis not present

## 2023-03-01 DIAGNOSIS — G43E09 Chronic migraine with aura, not intractable, without status migrainosus: Secondary | ICD-10-CM | POA: Diagnosis not present

## 2023-03-01 DIAGNOSIS — K219 Gastro-esophageal reflux disease without esophagitis: Secondary | ICD-10-CM | POA: Diagnosis not present

## 2023-03-02 DIAGNOSIS — M0580 Other rheumatoid arthritis with rheumatoid factor of unspecified site: Secondary | ICD-10-CM | POA: Diagnosis not present

## 2023-03-02 DIAGNOSIS — M791 Myalgia, unspecified site: Secondary | ICD-10-CM | POA: Diagnosis not present

## 2023-03-02 DIAGNOSIS — R051 Acute cough: Secondary | ICD-10-CM | POA: Diagnosis not present

## 2023-03-02 DIAGNOSIS — J029 Acute pharyngitis, unspecified: Secondary | ICD-10-CM | POA: Diagnosis not present

## 2023-03-17 DIAGNOSIS — G43E11 Chronic migraine with aura, intractable, with status migrainosus: Secondary | ICD-10-CM | POA: Diagnosis not present

## 2023-03-24 DIAGNOSIS — R42 Dizziness and giddiness: Secondary | ICD-10-CM | POA: Diagnosis not present

## 2023-03-24 DIAGNOSIS — G43E09 Chronic migraine with aura, not intractable, without status migrainosus: Secondary | ICD-10-CM | POA: Diagnosis not present

## 2023-03-24 DIAGNOSIS — N926 Irregular menstruation, unspecified: Secondary | ICD-10-CM | POA: Diagnosis not present

## 2023-03-24 DIAGNOSIS — R9089 Other abnormal findings on diagnostic imaging of central nervous system: Secondary | ICD-10-CM | POA: Diagnosis not present

## 2023-04-12 ENCOUNTER — Ambulatory Visit: Payer: Medicaid Other | Admitting: "Endocrinology

## 2023-04-14 ENCOUNTER — Encounter: Payer: Medicaid Other | Admitting: Internal Medicine

## 2023-04-14 ENCOUNTER — Ambulatory Visit: Payer: Medicaid Other | Admitting: "Endocrinology

## 2023-04-21 DIAGNOSIS — R9089 Other abnormal findings on diagnostic imaging of central nervous system: Secondary | ICD-10-CM | POA: Diagnosis not present

## 2023-04-22 DIAGNOSIS — M0580 Other rheumatoid arthritis with rheumatoid factor of unspecified site: Secondary | ICD-10-CM | POA: Diagnosis not present

## 2023-04-22 DIAGNOSIS — G43E09 Chronic migraine with aura, not intractable, without status migrainosus: Secondary | ICD-10-CM | POA: Diagnosis not present

## 2023-04-22 DIAGNOSIS — F4323 Adjustment disorder with mixed anxiety and depressed mood: Secondary | ICD-10-CM | POA: Diagnosis not present

## 2023-05-05 ENCOUNTER — Ambulatory Visit: Admitting: "Endocrinology

## 2023-06-05 DIAGNOSIS — H5213 Myopia, bilateral: Secondary | ICD-10-CM | POA: Diagnosis not present

## 2023-06-16 ENCOUNTER — Ambulatory Visit: Admitting: "Endocrinology

## 2023-07-05 DIAGNOSIS — R1084 Generalized abdominal pain: Secondary | ICD-10-CM | POA: Diagnosis not present

## 2023-07-05 DIAGNOSIS — R10814 Left lower quadrant abdominal tenderness: Secondary | ICD-10-CM | POA: Diagnosis not present

## 2023-07-05 DIAGNOSIS — R197 Diarrhea, unspecified: Secondary | ICD-10-CM | POA: Diagnosis not present

## 2023-07-07 DIAGNOSIS — R188 Other ascites: Secondary | ICD-10-CM | POA: Diagnosis not present

## 2023-07-07 DIAGNOSIS — K429 Umbilical hernia without obstruction or gangrene: Secondary | ICD-10-CM | POA: Diagnosis not present

## 2023-07-07 DIAGNOSIS — K439 Ventral hernia without obstruction or gangrene: Secondary | ICD-10-CM | POA: Diagnosis not present

## 2023-07-07 DIAGNOSIS — K5730 Diverticulosis of large intestine without perforation or abscess without bleeding: Secondary | ICD-10-CM | POA: Diagnosis not present

## 2023-09-06 DIAGNOSIS — M0580 Other rheumatoid arthritis with rheumatoid factor of unspecified site: Secondary | ICD-10-CM | POA: Diagnosis not present

## 2023-09-06 DIAGNOSIS — G43E09 Chronic migraine with aura, not intractable, without status migrainosus: Secondary | ICD-10-CM | POA: Diagnosis not present

## 2023-09-06 DIAGNOSIS — F4323 Adjustment disorder with mixed anxiety and depressed mood: Secondary | ICD-10-CM | POA: Diagnosis not present

## 2023-09-13 DIAGNOSIS — G43E09 Chronic migraine with aura, not intractable, without status migrainosus: Secondary | ICD-10-CM | POA: Diagnosis not present

## 2023-09-28 NOTE — Progress Notes (Deleted)
 Office Visit Note  Patient: Jackie Brooks             Date of Birth: 11-10-1985           MRN: [id number]             PCP: Levora Reyes SAUNDERS, MD Referring: Vaughn Lauraine KATHEE DEVONNA Visit Date: 09/29/2023 Occupation: @GUAROCC @  Subjective:  No chief complaint on file.   History of Present Illness: Jackie Brooks is a 38 y.o. female ***     Activities of Daily Living:  Patient reports morning stiffness for *** {minute/hour:19697}.   Patient {ACTIONS;DENIES/REPORTS:21021675::Denies} nocturnal pain.  Difficulty dressing/grooming: {ACTIONS;DENIES/REPORTS:21021675::Denies} Difficulty climbing stairs: {ACTIONS;DENIES/REPORTS:21021675::Denies} Difficulty getting out of chair: {ACTIONS;DENIES/REPORTS:21021675::Denies} Difficulty using hands for taps, buttons, cutlery, and/or writing: {ACTIONS;DENIES/REPORTS:21021675::Denies}  No Rheumatology ROS completed.   PMFS History:  Patient Active Problem List   Diagnosis Date Noted   Menopausal symptom 06/23/2022   Right flank pain 06/23/2022   Recurrent UTI 06/23/2022   Family history of breast cancer 03/29/2019   Chronic right shoulder pain 12/05/2018   Bacterial vaginitis 11/22/2018   Stiffness of right shoulder joint 11/16/2018   Encounter for orthopedic follow-up care 09/28/2018   Anterior to posterior tear of superior glenoid labrum of right shoulder 09/28/2018   Pain in joint of right shoulder 09/13/2018   Ileus, postoperative (HCC) 12/15/2012   Edema 12/15/2012   Anemia 12/14/2012   Status post primary low transverse cesarean section 12/14/2012   Post-dates pregnancy 12/09/2012   Late prenatal care 09/02/2012   Migraines 09/02/2012    Past Medical History:  Diagnosis Date   Headache(784.0)    Infection    UTI   Medical history non-contributory     Family History  Problem Relation Age of Onset   Cancer Paternal Aunt        ? brain   Cancer Paternal Grandmother        breast   Hypertension Mother     Diabetes Mother    Diabetes Father    Past Surgical History:  Procedure Laterality Date   CESAREAN SECTION N/A 12/11/2012   Procedure: CESAREAN SECTION;  Surgeon: Hargis Paradise, MD;  Location: [facility];  Service: Obstetrics;  Laterality: N/A;   NO PAST SURGERIES     SHOULDER SURGERY Right 09/28/2018   Social History   Social History Narrative   ** Merged History Encounter **       There is no immunization history for the selected administration types on file for this patient.   Objective: Vital Signs: There were no vitals taken for this visit.   Physical Exam   Musculoskeletal Exam: ***  CDAI Exam: CDAI Score: -- Patient Global: --; Provider Global: -- Swollen: --; Tender: -- Joint Exam 09/29/2023   No joint exam has been documented for this visit   There is currently no information documented on the homunculus. Go to the Rheumatology activity and complete the homunculus joint exam.  Investigation: No additional findings.  Imaging: No results found.  Recent Labs: Lab Results  Component Value Date   WBC 6.0 06/23/2022   HGB 12.0 06/23/2022   PLT 307 06/23/2022   NA 138 08/26/2022   K 3.9 08/26/2022   CL 105 08/26/2022   CO2 27 08/26/2022   GLUCOSE 107 (H) 08/26/2022   BUN 9 08/26/2022   CREATININE 0.71 08/26/2022   BILITOT 0.3 06/23/2022   ALKPHOS 59 06/23/2022   AST 18 06/23/2022   ALT 15 06/23/2022   PROT 6.8 06/23/2022  ALBUMIN 4.5 06/23/2022   CALCIUM  10.1 08/26/2022   GFRAA 103 11/28/2018    Speciality Comments: No specialty comments available.  Procedures:  No procedures performed Allergies: Patient has no known allergies.   Assessment / Plan:     Visit Diagnoses: No diagnosis found.  Orders: No orders of the defined types were placed in this encounter.  No orders of the defined types were placed in this encounter.   Face-to-face time spent with patient was *** minutes. Greater than 50% of time was spent in counseling and  coordination of care.  Follow-Up Instructions: No follow-ups on file.   Lonni LELON Ester, MD  Note - This record has been created using [employer].  Chart creation errors have been sought, but may not always  have been located. Such creation errors do not reflect on  the standard of medical care.

## 2023-09-29 ENCOUNTER — Encounter: Admitting: Internal Medicine

## 2023-10-07 DIAGNOSIS — G43109 Migraine with aura, not intractable, without status migrainosus: Secondary | ICD-10-CM | POA: Diagnosis not present

## 2023-10-07 DIAGNOSIS — F331 Major depressive disorder, recurrent, moderate: Secondary | ICD-10-CM | POA: Diagnosis not present

## 2023-11-17 DIAGNOSIS — G8929 Other chronic pain: Secondary | ICD-10-CM | POA: Diagnosis not present

## 2023-11-17 DIAGNOSIS — R768 Other specified abnormal immunological findings in serum: Secondary | ICD-10-CM | POA: Diagnosis not present

## 2023-11-17 DIAGNOSIS — N951 Menopausal and female climacteric states: Secondary | ICD-10-CM | POA: Diagnosis not present

## 2023-11-17 DIAGNOSIS — R61 Generalized hyperhidrosis: Secondary | ICD-10-CM | POA: Diagnosis not present

## 2023-11-17 DIAGNOSIS — M7989 Other specified soft tissue disorders: Secondary | ICD-10-CM | POA: Diagnosis not present

## 2023-11-17 DIAGNOSIS — R519 Headache, unspecified: Secondary | ICD-10-CM | POA: Diagnosis not present

## 2023-11-17 DIAGNOSIS — R200 Anesthesia of skin: Secondary | ICD-10-CM | POA: Diagnosis not present

## 2023-11-17 DIAGNOSIS — R0602 Shortness of breath: Secondary | ICD-10-CM | POA: Diagnosis not present

## 2023-11-17 DIAGNOSIS — M2550 Pain in unspecified joint: Secondary | ICD-10-CM | POA: Diagnosis not present

## 2023-12-29 DIAGNOSIS — R221 Localized swelling, mass and lump, neck: Secondary | ICD-10-CM | POA: Diagnosis not present

## 2023-12-29 DIAGNOSIS — Z113 Encounter for screening for infections with a predominantly sexual mode of transmission: Secondary | ICD-10-CM | POA: Diagnosis not present

## 2023-12-30 DIAGNOSIS — R590 Localized enlarged lymph nodes: Secondary | ICD-10-CM | POA: Diagnosis not present

## 2023-12-30 DIAGNOSIS — R221 Localized swelling, mass and lump, neck: Secondary | ICD-10-CM | POA: Diagnosis not present

## 2024-01-04 DIAGNOSIS — L299 Pruritus, unspecified: Secondary | ICD-10-CM | POA: Diagnosis not present

## 2024-01-04 DIAGNOSIS — M542 Cervicalgia: Secondary | ICD-10-CM | POA: Diagnosis not present

## 2024-01-05 DIAGNOSIS — K436 Other and unspecified ventral hernia with obstruction, without gangrene: Secondary | ICD-10-CM | POA: Diagnosis not present

## 2024-01-05 DIAGNOSIS — E669 Obesity, unspecified: Secondary | ICD-10-CM | POA: Diagnosis not present

## 2024-01-05 DIAGNOSIS — K439 Ventral hernia without obstruction or gangrene: Secondary | ICD-10-CM | POA: Diagnosis not present

## 2024-01-05 DIAGNOSIS — K429 Umbilical hernia without obstruction or gangrene: Secondary | ICD-10-CM | POA: Diagnosis not present

## 2024-01-19 DIAGNOSIS — K439 Ventral hernia without obstruction or gangrene: Secondary | ICD-10-CM | POA: Diagnosis not present

## 2024-01-19 DIAGNOSIS — E6609 Other obesity due to excess calories: Secondary | ICD-10-CM | POA: Diagnosis not present

## 2024-01-19 DIAGNOSIS — K429 Umbilical hernia without obstruction or gangrene: Secondary | ICD-10-CM | POA: Diagnosis not present

## 2024-01-19 DIAGNOSIS — E66811 Obesity, class 1: Secondary | ICD-10-CM | POA: Diagnosis not present

## 2024-01-19 DIAGNOSIS — Z6830 Body mass index (BMI) 30.0-30.9, adult: Secondary | ICD-10-CM | POA: Diagnosis not present
